# Patient Record
Sex: Female | Born: 1955 | Race: Black or African American | Hispanic: No | Smoking: Former smoker
Health system: Southern US, Community
[De-identification: ages and names within clinical notes are randomized; demographics above are authoritative.]

## PROBLEM LIST (undated history)

## (undated) DIAGNOSIS — M1990 Unspecified osteoarthritis, unspecified site: Secondary | ICD-10-CM

## (undated) DIAGNOSIS — C3490 Malignant neoplasm of unspecified part of unspecified bronchus or lung: Secondary | ICD-10-CM

## (undated) DIAGNOSIS — K219 Gastro-esophageal reflux disease without esophagitis: Secondary | ICD-10-CM

## (undated) DIAGNOSIS — J449 Chronic obstructive pulmonary disease, unspecified: Secondary | ICD-10-CM

## (undated) DIAGNOSIS — K529 Noninfective gastroenteritis and colitis, unspecified: Secondary | ICD-10-CM

## (undated) DIAGNOSIS — Z860100 Personal history of colon polyps, unspecified: Secondary | ICD-10-CM

## (undated) DIAGNOSIS — R011 Cardiac murmur, unspecified: Secondary | ICD-10-CM

## (undated) DIAGNOSIS — G4700 Insomnia, unspecified: Secondary | ICD-10-CM

## (undated) DIAGNOSIS — J189 Pneumonia, unspecified organism: Secondary | ICD-10-CM

## (undated) DIAGNOSIS — M549 Dorsalgia, unspecified: Secondary | ICD-10-CM

## (undated) DIAGNOSIS — R202 Paresthesia of skin: Secondary | ICD-10-CM

## (undated) DIAGNOSIS — F411 Generalized anxiety disorder: Secondary | ICD-10-CM

## (undated) DIAGNOSIS — G8929 Other chronic pain: Secondary | ICD-10-CM

## (undated) DIAGNOSIS — Z8709 Personal history of other diseases of the respiratory system: Secondary | ICD-10-CM

## (undated) HISTORY — DX: Generalized anxiety disorder: F41.1

## (undated) HISTORY — PX: WRIST FRACTURE SURGERY: SHX121

## (undated) HISTORY — PX: COLONOSCOPY: SHX174

## (undated) HISTORY — PX: LAPAROSCOPIC GASTRIC BANDING: SHX1100

---

## 1979-01-22 HISTORY — PX: TUBAL LIGATION: SHX77

## 1989-01-21 HISTORY — PX: ABDOMINAL HYSTERECTOMY: SHX81

## 1998-01-15 ENCOUNTER — Encounter: Admission: RE | Admit: 1998-01-15 | Discharge: 1998-01-15 | Payer: Self-pay | Admitting: Family Medicine

## 1998-06-30 ENCOUNTER — Encounter: Admission: RE | Admit: 1998-06-30 | Discharge: 1998-06-30 | Payer: Self-pay | Admitting: Sports Medicine

## 1998-09-02 ENCOUNTER — Encounter: Admission: RE | Admit: 1998-09-02 | Discharge: 1998-09-02 | Payer: Self-pay | Admitting: Family Medicine

## 1998-09-17 ENCOUNTER — Encounter: Admission: RE | Admit: 1998-09-17 | Discharge: 1998-09-17 | Payer: Self-pay | Admitting: Family Medicine

## 1998-10-20 ENCOUNTER — Encounter: Admission: RE | Admit: 1998-10-20 | Discharge: 1998-10-20 | Payer: Self-pay | Admitting: Family Medicine

## 1998-10-20 ENCOUNTER — Other Ambulatory Visit: Admission: RE | Admit: 1998-10-20 | Discharge: 1998-10-20 | Payer: Self-pay | Admitting: *Deleted

## 1998-11-03 ENCOUNTER — Encounter: Admission: RE | Admit: 1998-11-03 | Discharge: 1998-11-03 | Payer: Self-pay | Admitting: Sports Medicine

## 1998-11-30 ENCOUNTER — Other Ambulatory Visit: Admission: RE | Admit: 1998-11-30 | Discharge: 1998-11-30 | Payer: Self-pay | Admitting: Obstetrics and Gynecology

## 1998-11-30 ENCOUNTER — Encounter (INDEPENDENT_AMBULATORY_CARE_PROVIDER_SITE_OTHER): Payer: Self-pay | Admitting: *Deleted

## 1999-02-16 ENCOUNTER — Encounter: Admission: RE | Admit: 1999-02-16 | Discharge: 1999-02-16 | Payer: Self-pay | Admitting: Family Medicine

## 1999-04-27 ENCOUNTER — Encounter: Admission: RE | Admit: 1999-04-27 | Discharge: 1999-04-27 | Payer: Self-pay | Admitting: Family Medicine

## 1999-04-27 ENCOUNTER — Ambulatory Visit (HOSPITAL_COMMUNITY): Admission: RE | Admit: 1999-04-27 | Discharge: 1999-04-27 | Payer: Self-pay | Admitting: Family Medicine

## 1999-05-03 ENCOUNTER — Encounter: Admission: RE | Admit: 1999-05-03 | Discharge: 1999-05-03 | Payer: Self-pay | Admitting: Family Medicine

## 1999-05-26 ENCOUNTER — Encounter: Admission: RE | Admit: 1999-05-26 | Discharge: 1999-05-26 | Payer: Self-pay | Admitting: Family Medicine

## 1999-06-09 ENCOUNTER — Encounter (INDEPENDENT_AMBULATORY_CARE_PROVIDER_SITE_OTHER): Payer: Self-pay

## 1999-06-09 ENCOUNTER — Other Ambulatory Visit: Admission: RE | Admit: 1999-06-09 | Discharge: 1999-06-09 | Payer: Self-pay | Admitting: Obstetrics and Gynecology

## 1999-06-11 ENCOUNTER — Encounter: Admission: RE | Admit: 1999-06-11 | Discharge: 1999-06-11 | Payer: Self-pay | Admitting: Family Medicine

## 1999-08-04 ENCOUNTER — Encounter: Admission: RE | Admit: 1999-08-04 | Discharge: 1999-08-04 | Payer: Self-pay | Admitting: Obstetrics and Gynecology

## 1999-08-04 ENCOUNTER — Encounter: Payer: Self-pay | Admitting: Obstetrics and Gynecology

## 1999-08-24 ENCOUNTER — Encounter: Payer: Self-pay | Admitting: Obstetrics and Gynecology

## 1999-08-26 ENCOUNTER — Encounter (INDEPENDENT_AMBULATORY_CARE_PROVIDER_SITE_OTHER): Payer: Self-pay

## 1999-08-27 ENCOUNTER — Inpatient Hospital Stay (HOSPITAL_COMMUNITY): Admission: RE | Admit: 1999-08-27 | Discharge: 1999-08-28 | Payer: Self-pay | Admitting: Obstetrics and Gynecology

## 1999-11-08 ENCOUNTER — Encounter: Payer: Self-pay | Admitting: *Deleted

## 1999-11-08 ENCOUNTER — Encounter: Admission: RE | Admit: 1999-11-08 | Discharge: 1999-11-08 | Payer: Self-pay | Admitting: Sports Medicine

## 1999-11-08 ENCOUNTER — Encounter: Admission: RE | Admit: 1999-11-08 | Discharge: 1999-11-08 | Payer: Self-pay | Admitting: *Deleted

## 2000-03-24 ENCOUNTER — Encounter: Admission: RE | Admit: 2000-03-24 | Discharge: 2000-03-24 | Payer: Self-pay | Admitting: Family Medicine

## 2000-03-28 ENCOUNTER — Encounter: Admission: RE | Admit: 2000-03-28 | Discharge: 2000-03-28 | Payer: Self-pay | Admitting: Family Medicine

## 2000-07-19 ENCOUNTER — Ambulatory Visit (HOSPITAL_COMMUNITY): Admission: RE | Admit: 2000-07-19 | Discharge: 2000-07-19 | Payer: Self-pay | Admitting: *Deleted

## 2000-07-19 ENCOUNTER — Encounter: Admission: RE | Admit: 2000-07-19 | Discharge: 2000-07-19 | Payer: Self-pay | Admitting: Sports Medicine

## 2000-07-19 ENCOUNTER — Encounter: Payer: Self-pay | Admitting: Sports Medicine

## 2000-08-08 ENCOUNTER — Encounter: Admission: RE | Admit: 2000-08-08 | Discharge: 2000-08-08 | Payer: Self-pay | Admitting: Obstetrics and Gynecology

## 2000-08-08 ENCOUNTER — Encounter: Payer: Self-pay | Admitting: Obstetrics and Gynecology

## 2000-11-15 ENCOUNTER — Encounter: Admission: RE | Admit: 2000-11-15 | Discharge: 2000-11-15 | Payer: Self-pay | Admitting: Family Medicine

## 2000-11-24 ENCOUNTER — Ambulatory Visit (HOSPITAL_BASED_OUTPATIENT_CLINIC_OR_DEPARTMENT_OTHER): Admission: RE | Admit: 2000-11-24 | Discharge: 2000-11-24 | Payer: Self-pay | Admitting: *Deleted

## 2000-12-18 ENCOUNTER — Encounter: Admission: RE | Admit: 2000-12-18 | Discharge: 2000-12-18 | Payer: Self-pay | Admitting: Family Medicine

## 2001-01-03 ENCOUNTER — Encounter: Admission: RE | Admit: 2001-01-03 | Discharge: 2001-01-03 | Payer: Self-pay | Admitting: Family Medicine

## 2001-01-08 ENCOUNTER — Encounter: Admission: RE | Admit: 2001-01-08 | Discharge: 2001-01-08 | Payer: Self-pay | Admitting: Family Medicine

## 2001-03-05 ENCOUNTER — Encounter: Admission: RE | Admit: 2001-03-05 | Discharge: 2001-03-05 | Payer: Self-pay | Admitting: Family Medicine

## 2001-05-30 ENCOUNTER — Encounter: Admission: RE | Admit: 2001-05-30 | Discharge: 2001-05-30 | Payer: Self-pay | Admitting: Cardiovascular Disease

## 2001-05-30 ENCOUNTER — Encounter: Payer: Self-pay | Admitting: Cardiovascular Disease

## 2001-08-10 ENCOUNTER — Encounter: Payer: Self-pay | Admitting: Obstetrics and Gynecology

## 2001-08-10 ENCOUNTER — Encounter: Admission: RE | Admit: 2001-08-10 | Discharge: 2001-08-10 | Payer: Self-pay | Admitting: Obstetrics and Gynecology

## 2001-11-16 ENCOUNTER — Encounter: Admission: RE | Admit: 2001-11-16 | Discharge: 2001-11-16 | Payer: Self-pay | Admitting: Family Medicine

## 2001-11-28 ENCOUNTER — Encounter: Admission: RE | Admit: 2001-11-28 | Discharge: 2001-11-28 | Payer: Self-pay | Admitting: Family Medicine

## 2001-12-12 ENCOUNTER — Encounter: Admission: RE | Admit: 2001-12-12 | Discharge: 2002-01-02 | Payer: Self-pay | Admitting: Occupational Medicine

## 2002-02-05 ENCOUNTER — Encounter: Admission: RE | Admit: 2002-02-05 | Discharge: 2002-02-15 | Payer: Self-pay | Admitting: Orthopedic Surgery

## 2002-03-12 ENCOUNTER — Encounter: Admission: RE | Admit: 2002-03-12 | Discharge: 2002-03-12 | Payer: Self-pay | Admitting: Family Medicine

## 2002-04-17 ENCOUNTER — Other Ambulatory Visit: Admission: RE | Admit: 2002-04-17 | Discharge: 2002-04-17 | Payer: Self-pay | Admitting: Obstetrics and Gynecology

## 2002-05-17 ENCOUNTER — Encounter: Payer: Self-pay | Admitting: Orthopedic Surgery

## 2002-05-17 ENCOUNTER — Ambulatory Visit (HOSPITAL_COMMUNITY): Admission: RE | Admit: 2002-05-17 | Discharge: 2002-05-17 | Payer: Self-pay | Admitting: Orthopedic Surgery

## 2002-08-14 ENCOUNTER — Encounter: Admission: RE | Admit: 2002-08-14 | Discharge: 2002-08-14 | Payer: Self-pay | Admitting: Obstetrics and Gynecology

## 2002-08-14 ENCOUNTER — Encounter: Payer: Self-pay | Admitting: Obstetrics and Gynecology

## 2002-08-23 ENCOUNTER — Encounter: Admission: RE | Admit: 2002-08-23 | Discharge: 2002-08-23 | Payer: Self-pay | Admitting: Family Medicine

## 2003-05-09 ENCOUNTER — Encounter: Admission: RE | Admit: 2003-05-09 | Discharge: 2003-05-09 | Payer: Self-pay | Admitting: Sports Medicine

## 2003-05-29 ENCOUNTER — Encounter: Admission: RE | Admit: 2003-05-29 | Discharge: 2003-05-29 | Payer: Self-pay | Admitting: Family Medicine

## 2003-06-09 ENCOUNTER — Encounter: Admission: RE | Admit: 2003-06-09 | Discharge: 2003-06-09 | Payer: Self-pay | Admitting: Sports Medicine

## 2003-07-29 ENCOUNTER — Encounter: Admission: RE | Admit: 2003-07-29 | Discharge: 2003-07-29 | Payer: Self-pay | Admitting: Surgery

## 2003-08-05 ENCOUNTER — Encounter: Admission: RE | Admit: 2003-08-05 | Discharge: 2003-11-03 | Payer: Self-pay | Admitting: Surgery

## 2003-08-06 ENCOUNTER — Encounter: Admission: RE | Admit: 2003-08-06 | Discharge: 2003-08-06 | Payer: Self-pay | Admitting: Surgery

## 2003-08-25 ENCOUNTER — Encounter: Admission: RE | Admit: 2003-08-25 | Discharge: 2003-08-25 | Payer: Self-pay | Admitting: Family Medicine

## 2003-09-12 ENCOUNTER — Encounter: Payer: Self-pay | Admitting: Internal Medicine

## 2003-09-12 ENCOUNTER — Ambulatory Visit (HOSPITAL_COMMUNITY): Admission: RE | Admit: 2003-09-12 | Discharge: 2003-09-12 | Payer: Self-pay | Admitting: Surgery

## 2003-09-21 ENCOUNTER — Encounter (INDEPENDENT_AMBULATORY_CARE_PROVIDER_SITE_OTHER): Payer: Self-pay | Admitting: *Deleted

## 2003-10-02 ENCOUNTER — Encounter: Admission: RE | Admit: 2003-10-02 | Discharge: 2003-10-02 | Payer: Self-pay | Admitting: Sports Medicine

## 2003-10-15 ENCOUNTER — Encounter: Admission: RE | Admit: 2003-10-15 | Discharge: 2003-10-15 | Payer: Self-pay | Admitting: Surgery

## 2003-11-25 ENCOUNTER — Encounter: Admission: RE | Admit: 2003-11-25 | Discharge: 2004-02-09 | Payer: Self-pay | Admitting: Surgery

## 2003-12-09 ENCOUNTER — Observation Stay (HOSPITAL_COMMUNITY): Admission: RE | Admit: 2003-12-09 | Discharge: 2003-12-10 | Payer: Self-pay | Admitting: *Deleted

## 2003-12-24 ENCOUNTER — Ambulatory Visit (HOSPITAL_COMMUNITY): Admission: RE | Admit: 2003-12-24 | Discharge: 2003-12-24 | Payer: Self-pay | Admitting: Surgery

## 2003-12-24 ENCOUNTER — Encounter: Admission: RE | Admit: 2003-12-24 | Discharge: 2003-12-24 | Payer: Self-pay | Admitting: Surgery

## 2003-12-25 ENCOUNTER — Encounter: Admission: RE | Admit: 2003-12-25 | Discharge: 2003-12-25 | Payer: Self-pay | Admitting: Family Medicine

## 2004-02-24 ENCOUNTER — Encounter: Admission: RE | Admit: 2004-02-24 | Discharge: 2004-05-24 | Payer: Self-pay | Admitting: Surgery

## 2004-03-08 ENCOUNTER — Encounter: Admission: RE | Admit: 2004-03-08 | Discharge: 2004-03-08 | Payer: Self-pay | Admitting: General Surgery

## 2004-07-06 ENCOUNTER — Encounter: Admission: RE | Admit: 2004-07-06 | Discharge: 2004-07-06 | Payer: Self-pay | Admitting: Sports Medicine

## 2004-12-06 ENCOUNTER — Ambulatory Visit: Payer: Self-pay | Admitting: Sports Medicine

## 2005-08-22 ENCOUNTER — Ambulatory Visit: Payer: Self-pay | Admitting: Sports Medicine

## 2005-08-22 ENCOUNTER — Encounter: Admission: RE | Admit: 2005-08-22 | Discharge: 2005-08-22 | Payer: Self-pay | Admitting: Sports Medicine

## 2005-08-24 ENCOUNTER — Ambulatory Visit: Payer: Self-pay | Admitting: Family Medicine

## 2005-08-25 ENCOUNTER — Encounter: Admission: RE | Admit: 2005-08-25 | Discharge: 2005-08-25 | Payer: Self-pay | Admitting: Sports Medicine

## 2005-09-13 ENCOUNTER — Ambulatory Visit: Payer: Self-pay | Admitting: Family Medicine

## 2006-03-16 ENCOUNTER — Encounter: Admission: RE | Admit: 2006-03-16 | Discharge: 2006-03-16 | Payer: Self-pay | Admitting: *Deleted

## 2006-04-20 ENCOUNTER — Ambulatory Visit: Payer: Self-pay | Admitting: Family Medicine

## 2006-05-04 ENCOUNTER — Ambulatory Visit: Payer: Self-pay | Admitting: Sports Medicine

## 2006-05-08 ENCOUNTER — Ambulatory Visit: Payer: Self-pay | Admitting: Family Medicine

## 2006-07-20 DIAGNOSIS — F17200 Nicotine dependence, unspecified, uncomplicated: Secondary | ICD-10-CM

## 2006-07-20 DIAGNOSIS — F411 Generalized anxiety disorder: Secondary | ICD-10-CM

## 2006-07-20 DIAGNOSIS — G4700 Insomnia, unspecified: Secondary | ICD-10-CM

## 2006-07-20 DIAGNOSIS — E669 Obesity, unspecified: Secondary | ICD-10-CM | POA: Insufficient documentation

## 2006-07-20 DIAGNOSIS — M545 Low back pain: Secondary | ICD-10-CM

## 2006-07-20 HISTORY — DX: Generalized anxiety disorder: F41.1

## 2006-07-21 ENCOUNTER — Encounter (INDEPENDENT_AMBULATORY_CARE_PROVIDER_SITE_OTHER): Payer: Self-pay | Admitting: *Deleted

## 2006-07-21 ENCOUNTER — Ambulatory Visit: Payer: Self-pay | Admitting: Family Medicine

## 2006-07-28 ENCOUNTER — Telehealth: Payer: Self-pay | Admitting: *Deleted

## 2006-08-03 ENCOUNTER — Encounter: Payer: Self-pay | Admitting: *Deleted

## 2006-08-04 ENCOUNTER — Ambulatory Visit: Payer: Self-pay | Admitting: Family Medicine

## 2006-08-04 DIAGNOSIS — R1031 Right lower quadrant pain: Secondary | ICD-10-CM

## 2006-08-04 DIAGNOSIS — N951 Menopausal and female climacteric states: Secondary | ICD-10-CM | POA: Insufficient documentation

## 2006-08-04 LAB — CONVERTED CEMR LAB
Bilirubin Urine: NEGATIVE
Blood in Urine, dipstick: NEGATIVE
Glucose, Urine, Semiquant: NEGATIVE
Hemoglobin: 11.6 g/dL
Protein, U semiquant: 30
pH: 5.5

## 2006-08-11 ENCOUNTER — Telehealth: Payer: Self-pay | Admitting: *Deleted

## 2006-08-12 ENCOUNTER — Encounter: Payer: Self-pay | Admitting: Family Medicine

## 2006-08-12 LAB — CONVERTED CEMR LAB: OCCULT 1: NEGATIVE

## 2006-08-13 ENCOUNTER — Encounter: Payer: Self-pay | Admitting: Family Medicine

## 2006-08-13 LAB — CONVERTED CEMR LAB: OCCULT 3: NEGATIVE

## 2006-08-14 ENCOUNTER — Encounter: Payer: Self-pay | Admitting: Family Medicine

## 2006-08-14 LAB — CONVERTED CEMR LAB: OCCULT 2: NEGATIVE

## 2006-08-17 ENCOUNTER — Encounter: Payer: Self-pay | Admitting: Family Medicine

## 2006-08-28 ENCOUNTER — Encounter: Admission: RE | Admit: 2006-08-28 | Discharge: 2006-08-28 | Payer: Self-pay | Admitting: Sports Medicine

## 2006-08-28 ENCOUNTER — Encounter: Payer: Self-pay | Admitting: Family Medicine

## 2006-09-20 ENCOUNTER — Telehealth: Payer: Self-pay | Admitting: *Deleted

## 2006-09-22 ENCOUNTER — Telehealth: Payer: Self-pay | Admitting: *Deleted

## 2006-10-12 ENCOUNTER — Encounter: Payer: Self-pay | Admitting: *Deleted

## 2006-10-31 ENCOUNTER — Telehealth: Payer: Self-pay | Admitting: *Deleted

## 2006-11-01 ENCOUNTER — Ambulatory Visit: Payer: Self-pay | Admitting: Family Medicine

## 2006-11-01 DIAGNOSIS — R350 Frequency of micturition: Secondary | ICD-10-CM

## 2006-11-01 LAB — CONVERTED CEMR LAB
Glucose, Urine, Semiquant: NEGATIVE
Nitrite: NEGATIVE
Protein, U semiquant: 100
Urobilinogen, UA: 1

## 2006-11-22 ENCOUNTER — Ambulatory Visit: Payer: Self-pay | Admitting: Family Medicine

## 2006-11-22 DIAGNOSIS — K5900 Constipation, unspecified: Secondary | ICD-10-CM | POA: Insufficient documentation

## 2006-11-27 ENCOUNTER — Telehealth (INDEPENDENT_AMBULATORY_CARE_PROVIDER_SITE_OTHER): Payer: Self-pay | Admitting: Family Medicine

## 2006-11-30 ENCOUNTER — Ambulatory Visit: Payer: Self-pay | Admitting: Internal Medicine

## 2006-11-30 ENCOUNTER — Encounter: Payer: Self-pay | Admitting: Internal Medicine

## 2006-12-19 ENCOUNTER — Ambulatory Visit: Payer: Self-pay | Admitting: Gastroenterology

## 2007-01-01 ENCOUNTER — Encounter: Payer: Self-pay | Admitting: Internal Medicine

## 2007-01-01 ENCOUNTER — Ambulatory Visit: Payer: Self-pay | Admitting: Gastroenterology

## 2007-01-03 ENCOUNTER — Telehealth: Payer: Self-pay | Admitting: *Deleted

## 2007-01-18 ENCOUNTER — Encounter: Payer: Self-pay | Admitting: Internal Medicine

## 2007-05-08 ENCOUNTER — Ambulatory Visit: Payer: Self-pay | Admitting: Internal Medicine

## 2007-05-08 DIAGNOSIS — E782 Mixed hyperlipidemia: Secondary | ICD-10-CM | POA: Insufficient documentation

## 2007-05-08 DIAGNOSIS — G43009 Migraine without aura, not intractable, without status migrainosus: Secondary | ICD-10-CM | POA: Insufficient documentation

## 2007-05-08 LAB — CONVERTED CEMR LAB
ALT: 18 units/L (ref 0–35)
AST: 16 units/L (ref 0–37)
Alkaline Phosphatase: 87 units/L (ref 39–117)
BUN: 8 mg/dL (ref 6–23)
Basophils Relative: 0.3 % (ref 0.0–1.0)
CO2: 30 meq/L (ref 19–32)
Calcium: 10.1 mg/dL (ref 8.4–10.5)
Chloride: 106 meq/L (ref 96–112)
Creatinine, Ser: 0.8 mg/dL (ref 0.4–1.2)
GFR calc Af Amer: 97 mL/min
Monocytes Relative: 4.6 % (ref 3.0–11.0)
Platelets: 349 10*3/uL (ref 150–400)
RDW: 15 % — ABNORMAL HIGH (ref 11.5–14.6)
TSH: 0.78 microintl units/mL (ref 0.35–5.50)
Total Bilirubin: 0.5 mg/dL (ref 0.3–1.2)
Total Protein: 6.1 g/dL (ref 6.0–8.3)
WBC: 7 10*3/uL (ref 4.5–10.5)

## 2007-05-09 ENCOUNTER — Encounter: Payer: Self-pay | Admitting: Internal Medicine

## 2007-05-10 ENCOUNTER — Telehealth: Payer: Self-pay | Admitting: Internal Medicine

## 2007-06-06 ENCOUNTER — Telehealth: Payer: Self-pay | Admitting: Internal Medicine

## 2007-07-09 ENCOUNTER — Telehealth (INDEPENDENT_AMBULATORY_CARE_PROVIDER_SITE_OTHER): Payer: Self-pay | Admitting: Family Medicine

## 2007-08-27 ENCOUNTER — Telehealth: Payer: Self-pay | Admitting: *Deleted

## 2007-08-30 ENCOUNTER — Encounter: Admission: RE | Admit: 2007-08-30 | Discharge: 2007-08-30 | Payer: Self-pay | Admitting: Internal Medicine

## 2007-09-03 ENCOUNTER — Telehealth: Payer: Self-pay | Admitting: Internal Medicine

## 2007-09-05 ENCOUNTER — Ambulatory Visit: Payer: Self-pay | Admitting: Internal Medicine

## 2007-09-05 DIAGNOSIS — N63 Unspecified lump in unspecified breast: Secondary | ICD-10-CM

## 2007-09-10 ENCOUNTER — Encounter: Admission: RE | Admit: 2007-09-10 | Discharge: 2007-09-10 | Payer: Self-pay | Admitting: Internal Medicine

## 2007-09-24 ENCOUNTER — Telehealth: Payer: Self-pay | Admitting: Internal Medicine

## 2007-09-24 ENCOUNTER — Encounter: Admission: RE | Admit: 2007-09-24 | Discharge: 2007-09-24 | Payer: Self-pay | Admitting: Internal Medicine

## 2007-09-25 ENCOUNTER — Encounter: Payer: Self-pay | Admitting: Internal Medicine

## 2007-10-08 ENCOUNTER — Encounter: Admission: RE | Admit: 2007-10-08 | Discharge: 2007-10-08 | Payer: Self-pay | Admitting: Internal Medicine

## 2008-01-11 ENCOUNTER — Ambulatory Visit: Payer: Self-pay | Admitting: Internal Medicine

## 2008-01-11 DIAGNOSIS — M65849 Other synovitis and tenosynovitis, unspecified hand: Secondary | ICD-10-CM

## 2008-01-11 DIAGNOSIS — R1084 Generalized abdominal pain: Secondary | ICD-10-CM

## 2008-01-11 DIAGNOSIS — M65839 Other synovitis and tenosynovitis, unspecified forearm: Secondary | ICD-10-CM

## 2008-08-27 DIAGNOSIS — R042 Hemoptysis: Secondary | ICD-10-CM | POA: Insufficient documentation

## 2008-09-19 ENCOUNTER — Ambulatory Visit: Payer: Self-pay | Admitting: Family Medicine

## 2008-09-19 ENCOUNTER — Ambulatory Visit: Payer: Self-pay | Admitting: Internal Medicine

## 2008-09-19 LAB — CONVERTED CEMR LAB: Urobilinogen, UA: 4

## 2008-09-22 ENCOUNTER — Telehealth (INDEPENDENT_AMBULATORY_CARE_PROVIDER_SITE_OTHER): Payer: Self-pay

## 2008-09-25 ENCOUNTER — Telehealth: Payer: Self-pay | Admitting: Internal Medicine

## 2008-09-25 LAB — CONVERTED CEMR LAB
Alkaline Phosphatase: 74 units/L (ref 39–117)
Basophils Absolute: 0 10*3/uL (ref 0.0–0.1)
Bilirubin, Direct: 0 mg/dL (ref 0.0–0.3)
CO2: 33 meq/L — ABNORMAL HIGH (ref 19–32)
Calcium: 9.7 mg/dL (ref 8.4–10.5)
Cholesterol: 220 mg/dL — ABNORMAL HIGH (ref 0–200)
Creatinine, Ser: 0.7 mg/dL (ref 0.4–1.2)
Eosinophils Absolute: 0.2 10*3/uL (ref 0.0–0.7)
Glucose, Bld: 100 mg/dL — ABNORMAL HIGH (ref 70–99)
Lymphocytes Relative: 50.4 % — ABNORMAL HIGH (ref 12.0–46.0)
MCHC: 34.1 g/dL (ref 30.0–36.0)
Neutrophils Relative %: 38.7 % — ABNORMAL LOW (ref 43.0–77.0)
Platelets: 331 10*3/uL (ref 150.0–400.0)
RBC: 3.73 M/uL — ABNORMAL LOW (ref 3.87–5.11)
RDW: 12.8 % (ref 11.5–14.6)
Total Bilirubin: 0.6 mg/dL (ref 0.3–1.2)
Total CHOL/HDL Ratio: 3
Triglycerides: 89 mg/dL (ref 0.0–149.0)

## 2008-10-01 ENCOUNTER — Ambulatory Visit: Payer: Self-pay | Admitting: Pulmonary Disease

## 2008-12-25 ENCOUNTER — Encounter: Admission: RE | Admit: 2008-12-25 | Discharge: 2008-12-25 | Payer: Self-pay | Admitting: Internal Medicine

## 2009-01-05 ENCOUNTER — Telehealth: Payer: Self-pay | Admitting: Internal Medicine

## 2009-02-12 ENCOUNTER — Ambulatory Visit: Payer: Self-pay | Admitting: Internal Medicine

## 2009-02-19 ENCOUNTER — Ambulatory Visit (HOSPITAL_BASED_OUTPATIENT_CLINIC_OR_DEPARTMENT_OTHER): Admission: RE | Admit: 2009-02-19 | Discharge: 2009-02-19 | Payer: Self-pay | Admitting: Orthopedic Surgery

## 2009-02-26 ENCOUNTER — Telehealth: Payer: Self-pay | Admitting: Internal Medicine

## 2009-03-16 ENCOUNTER — Telehealth: Payer: Self-pay | Admitting: Internal Medicine

## 2009-05-12 ENCOUNTER — Inpatient Hospital Stay (HOSPITAL_COMMUNITY): Admission: EM | Admit: 2009-05-12 | Discharge: 2009-05-13 | Payer: Self-pay | Admitting: Emergency Medicine

## 2009-06-18 ENCOUNTER — Encounter: Payer: Self-pay | Admitting: Internal Medicine

## 2009-08-10 ENCOUNTER — Ambulatory Visit: Payer: Self-pay | Admitting: Internal Medicine

## 2009-08-10 ENCOUNTER — Telehealth: Payer: Self-pay | Admitting: Internal Medicine

## 2009-08-10 DIAGNOSIS — K5909 Other constipation: Secondary | ICD-10-CM

## 2009-08-10 DIAGNOSIS — K5730 Diverticulosis of large intestine without perforation or abscess without bleeding: Secondary | ICD-10-CM | POA: Insufficient documentation

## 2009-08-10 LAB — CONVERTED CEMR LAB
AST: 16 units/L (ref 0–37)
Albumin: 3.6 g/dL (ref 3.5–5.2)
Basophils Absolute: 0.1 10*3/uL (ref 0.0–0.1)
Basophils Relative: 0.9 % (ref 0.0–3.0)
CO2: 34 meq/L — ABNORMAL HIGH (ref 19–32)
Glucose, Bld: 102 mg/dL — ABNORMAL HIGH (ref 70–99)
HCT: 35.3 % — ABNORMAL LOW (ref 36.0–46.0)
Hemoglobin: 11.5 g/dL — ABNORMAL LOW (ref 12.0–15.0)
Lymphs Abs: 2.5 10*3/uL (ref 0.7–4.0)
MCHC: 32.7 g/dL (ref 30.0–36.0)
Monocytes Relative: 5.4 % (ref 3.0–12.0)
Neutro Abs: 2.9 10*3/uL (ref 1.4–7.7)
Potassium: 4.2 meq/L (ref 3.5–5.1)
RBC: 3.72 M/uL — ABNORMAL LOW (ref 3.87–5.11)
RDW: 14.1 % (ref 11.5–14.6)
Sodium: 141 meq/L (ref 135–145)
TSH: 0.68 microintl units/mL (ref 0.35–5.50)
Total Protein: 6.2 g/dL (ref 6.0–8.3)

## 2009-08-17 ENCOUNTER — Encounter: Admission: RE | Admit: 2009-08-17 | Discharge: 2009-08-17 | Payer: Self-pay | Admitting: Internal Medicine

## 2009-08-24 ENCOUNTER — Telehealth: Payer: Self-pay

## 2009-09-15 ENCOUNTER — Ambulatory Visit: Payer: Self-pay | Admitting: Internal Medicine

## 2009-09-16 ENCOUNTER — Encounter (INDEPENDENT_AMBULATORY_CARE_PROVIDER_SITE_OTHER): Payer: Self-pay | Admitting: *Deleted

## 2009-10-27 ENCOUNTER — Telehealth: Payer: Self-pay | Admitting: Internal Medicine

## 2009-11-02 ENCOUNTER — Telehealth: Payer: Self-pay | Admitting: Internal Medicine

## 2010-01-14 ENCOUNTER — Ambulatory Visit: Payer: Self-pay | Admitting: Internal Medicine

## 2010-01-14 DIAGNOSIS — J029 Acute pharyngitis, unspecified: Secondary | ICD-10-CM | POA: Insufficient documentation

## 2010-02-11 ENCOUNTER — Encounter (INDEPENDENT_AMBULATORY_CARE_PROVIDER_SITE_OTHER): Payer: Self-pay | Admitting: *Deleted

## 2010-03-30 ENCOUNTER — Ambulatory Visit: Payer: Self-pay | Admitting: Gastroenterology

## 2010-03-31 ENCOUNTER — Ambulatory Visit: Payer: Self-pay | Admitting: Internal Medicine

## 2010-04-30 ENCOUNTER — Ambulatory Visit: Payer: Self-pay | Admitting: Internal Medicine

## 2010-04-30 DIAGNOSIS — M25519 Pain in unspecified shoulder: Secondary | ICD-10-CM | POA: Insufficient documentation

## 2010-04-30 DIAGNOSIS — M79609 Pain in unspecified limb: Secondary | ICD-10-CM | POA: Insufficient documentation

## 2010-06-13 ENCOUNTER — Encounter: Payer: Self-pay | Admitting: Internal Medicine

## 2010-06-13 ENCOUNTER — Encounter: Payer: Self-pay | Admitting: Surgery

## 2010-06-22 NOTE — Letter (Signed)
Summary: New Patient letter  [facility]  [address] [place], [place] [address]   Phone: [phone]  Fax: [phone]       09/16/2009 MRN: [id number]  Lauren Campos [address] [place], [place]  [address]  Dear Lauren Campos,  Welcome to the Gastroenterology Division at [facility].    You are scheduled to see Dr. Jarold Motto on 10-13-09 at 2:00p.m. on the 3rd floor at [facility], [address]  We ask that you try to arrive at our office 15 minutes prior to your appointment time to allow for check-in.  We would like you to complete the enclosed self-administered evaluation form prior to your visit and bring it with you on the day of your appointment.  We will review it with you.  Also, please bring a complete list of all your medications or, if you prefer, bring the medication bottles and we will list them.  Please bring your insurance card so that we may make a copy of it.  If your insurance requires a referral to see a specialist, please bring your referral form from your primary care physician.  Co-payments are due at the time of your visit and may be paid by cash, check or credit card.     Your office visit will consist of a consult with your physician (includes a physical exam), any laboratory testing he/she may order, scheduling of any necessary diagnostic testing (e.g. x-ray, ultrasound, CT-scan), and scheduling of a procedure (e.g. Endoscopy, Colonoscopy) if required.  Please allow enough time on your schedule to allow for any/all of these possibilities.    If you cannot keep your appointment, please call [phone] to cancel or reschedule prior to your appointment date.  This allows [place] the opportunity to schedule an appointment for another patient in need of care.  If you do not cancel or reschedule by 5 p.m. the business day prior to your appointment date, you will be charged a $50.00 late cancellation/no-show fee.    Thank you for  choosing [facility] for your medical needs.  We appreciate the opportunity to care for you.  Please visit [place] at our website  to learn more about our practice.                     Sincerely,                                                             The Gastroenterology Division

## 2010-06-22 NOTE — Letter (Signed)
Summary: Out of Work  Adult nurse at [employer]  [address]   [place], [place] [address]   Phone: [phone]  Fax: [phone]    April 30, 2010   Employee:  SHANEKA EFAW    To Whom It May Concern:   For Medical reasons, please excuse the above named employee from work for the following dates:  Start:   04-30-10  End:   05-01-10  If you need additional information, please feel free to contact our office.         Sincerely,    Gordy Savers  MD

## 2010-06-22 NOTE — Assessment & Plan Note (Signed)
Summary: AB PAIN//CCM   Vital Signs:  Patient profile:   55 year old female Weight:      184 pounds Temp:     98.4 degrees F oral BP sitting:   120 / 80  (right arm) Cuff size:   regular  Vitals Entered By: Duard Brady LPN (August 10, 2009 1:45 PM) CC: c/o 3wks on/off abd pain , c/o sharp pain in vaginal area , hysto 5 yrs ago - has not seen gyn Is Patient Diabetic? No   Primary Care Provider:  Gordy Savers  MD  CC:  c/o 3wks on/off abd pain , c/o sharp pain in vaginal area , and hysto 5 yrs ago - has not seen gyn.  History of Present Illness: 55 year old patient who is seen today with a chief complaint of epigastric and left upper quadrant abdominal pain.  This has been present for approximately 3 weeks.  She also describes some sharp pelvic pain for the past 3 to 4 days.  She has had a remote hysterectomy.  She states she has had actually no bowel movement in 3 months.  He denies any anorexia, nausea or vomiting.  She has used a number of laxatives as well as enemas.  She did have a colonoscopy in August of 2008 that revealed mild diverticular disease only medical regimen includes Vicodin and amitriptyline.  She is  status post lap band surgery approximately 5 years ago.  She states that she was told 20 years ago, that she had "inflammatory bowel disease".  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  Was also receiving ideas of reference. (see office note) hysterectomy was performed because of cervical dysplasia and fibroids but no overt cancer.  Per operative note of 08/1999 [facility]. long history of history of  anxiety disorder history of borderline hypercholesterolemia chronic constipation Diverticulosis, colon  Family History: Reviewed history  from 10/01/2008 and no changes required. 3 bros, 1 sis - hypothyroid,  mother  DM   asthma: brother heart disease: mother (died MI 22s), father,  brother (died MI 28s) cancer: father (prostate), mother (breast)   Review of Systems       The patient complains of abdominal pain.  The patient denies anorexia, fever, weight loss, weight gain, vision loss, decreased hearing, hoarseness, chest pain, syncope, dyspnea on exertion, peripheral edema, prolonged cough, headaches, hemoptysis, melena, hematochezia, severe indigestion/heartburn, hematuria, incontinence, genital sores, muscle weakness, suspicious skin lesions, transient blindness, difficulty walking, depression, unusual weight change, abnormal bleeding, enlarged lymph nodes, angioedema, and breast masses.    Physical Exam  General:  overweight-appearing.  no distress.  Normal blood pressure, afebrileoverweight-appearing.   Head:  Normocephalic and atraumatic without obvious abnormalities. No apparent alopecia or balding. Eyes:  No corneal or conjunctival inflammation noted. EOMI. Perrla. Funduscopic exam benign, without hemorrhages, exudates or papilledema. Vision grossly normal. Mouth:  Oral mucosa and oropharynx without lesions or exudates.  Teeth in good repair. Neck:  No deformities, masses, or tenderness noted. Lungs:  Normal respiratory effort, chest expands symmetrically. Lungs are clear to auscultation, no crackles or wheezes. Heart:  Normal rate and regular rhythm. S1 and S2 normal without gallop, murmur, click, rub or other extra sounds. Abdomen:  obese soft and nontender.  No apparent distention.  Bowel sounds were normal.  No guarding Rectal:  no stool in the rectal vault Msk:  No deformity or scoliosis noted of thoracic or lumbar spine.   Pulses:  R and L carotid,radial,femoral,dorsalis pedis and posterior tibial pulses are full and equal bilaterally Extremities:  No clubbing, cyanosis, edema, or deformity noted with normal  full range of motion of all joints.     Impression & Recommendations:  Problem # 1:  CONSTIPATION, CHRONIC (ICD-564.09)  Problem # 2:  ABDOMINAL PAIN, GENERALIZED (ICD-789.07)  Problem # 3:  OBESITY, NOS (ICD-278.00)  Complete Medication List: 1)  Amitriptyline Hcl 100 Mg Tabs (Amitriptyline hcl) .Marland Kitchen.. 1 once daily as needed 2)  Betamethasone Valerate 0.1 % Lotn (Betamethasone valerate) .... Apply 1 a small amount to affected area twice a day 3)  Vicodin 5-500 Mg Tabs (Hydrocodone-acetaminophen) .... One up to two times a day prn 4)  Klor-con 20 Meq Pack (Potassium chloride) .Marland Kitchen.. 1 once daily 5)  Tramadol Hcl 50 Mg Tabs (Tramadol hcl) .... One every 6 hours or as needed for pain 6)  Clonazepam 0.5 Mg Tabs (Clonazepam) .... One or two at bedtime as needed  Other Orders: Venipuncture (40981) TLB-BMP (Basic Metabolic Panel-BMET) (80048-METABOL) TLB-CBC Platelet - w/Differential (85025-CBCD) TLB-Hepatic/Liver Function Pnl (80076-HEPATIC) TLB-TSH (Thyroid Stimulating Hormone) (84443-TSH) T-Abdomen 2-view (19147WG)  Patient Instructions: 1)  Drink clear liquids only for the next 24 hours, then slowly add other liquids and food as you  tolerate them. 2)  It is important that you exercise regularly at least 20 minutes 5 times a week. If you develop chest pain, have severe difficulty breathing, or feel very tired , stop exercising immediately and seek medical attention. 3)  high fiber diet 4)  use MiraLax to 4 times daily until diarrhea 5)  Please schedule a follow-up appointment in 2 weeks. Prescriptions: CLONAZEPAM 0.5 MG TABS (CLONAZEPAM) one or two at bedtime as needed  #60 x 3   Entered and Authorized by:   Gordy Savers  MD   Signed by:   Gordy Savers  MD on 08/10/2009   Method used:   Print then Give to Patient   RxID:   [id number] TRAMADOL HCL 50 MG TABS (TRAMADOL HCL) one every 6 hours or as needed for pain  #100 x 3   Entered and Authorized by:   Gordy Savers  MD   Signed by:   Gordy Savers  MD on 08/10/2009   Method used:   Print then Give to Patient   RxID:   [id number]

## 2010-06-22 NOTE — Assessment & Plan Note (Signed)
Summary: sore throat/numbness in tongue/cjr   Vital Signs:  Patient profile:   55 year old female Weight:      184 pounds Temp:     98.1 degrees F oral  Vitals Entered By: Kathrynn Speed CMA (January 14, 2010 3:40 PM) CC: sore throat, x 2 weeks, numbness around tip of tongue, adb pain, ache, on both sides,Requesting refills, this is a different type of sore throat she is concerned maybe cancer, currently smoking one pack of cig a day,  src Is Patient Diabetic? No   Primary Care Provider:  Gordy Savers  MD  CC:  sore throat, x 2 weeks, numbness around tip of tongue, adb pain, ache, on both sides, Requesting refills, this is a different type of sore throat she is concerned maybe cancer, currently smoking one pack of cig a day, and src.  History of Present Illness: 55 year old patient who presents with a 10 day history of sore throat.  He denies much in the way of cough or URI symptoms.  She does smoke daily.  She is requesting the phentermine to assist with weight loss.  No productive cough, chest pain.  He does have a history mild dyslipidemia, and anxiety disorder  Preventive Screening-Counseling & Management  Alcohol-Tobacco     Smoking Status: current     Smoking Cessation Counseling: yes     Packs/Day: 1.0     Year Started: 1971  Current Medications (verified): 1)  Amitriptyline Hcl 100 Mg Tabs (Amitriptyline Hcl) .Marland Kitchen.. 1 Once Daily As Needed 2)  Clonazepam 0.5 Mg Tabs (Clonazepam) .... One or Two At Bedtime As Needed 3)  Vicodin 5-500 Mg Tabs (Hydrocodone-Acetaminophen) .Marland Kitchen.. 1 By Mouth Two Times A Day Prn  Allergies (verified): No Known Drug Allergies  Past History:  Past Medical History: Reviewed history from 08/10/2009 and no changes required. 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  Was also receiving ideas of reference. (see office note) hysterectomy was performed because of cervical dysplasia and fibroids but no  overt cancer.  Per operative note of 08/1999 [facility]. long history of history of  anxiety disorder history of borderline hypercholesterolemia chronic constipation Diverticulosis, colon  Social History: Packs/Day:  1.0  Physical Exam  General:  overweight-appearing.  normal blood pressure Head:  Normocephalic and atraumatic without obvious abnormalities. No apparent alopecia or balding. Eyes:  No corneal or conjunctival inflammation noted. EOMI. Perrla. Funduscopic exam benign, without hemorrhages, exudates or papilledema. Vision grossly normal. Ears:  External ear exam shows no significant lesions or deformities.  Otoscopic examination reveals clear canals, tympanic membranes are intact bilaterally without bulging, retraction, inflammation or discharge. Hearing is grossly normal bilaterally. Mouth:  Oral mucosa and oropharynx without lesions or exudates. upper dentures in place Neck:  No deformities, masses, or tenderness noted. Lungs:  Normal respiratory effort, chest expands symmetrically. Lungs are clear to auscultation, no crackles or wheezes. Heart:  Normal rate and regular rhythm. S1 and S2 normal without gallop, murmur, click, rub or other extra sounds. Abdomen:  Bowel sounds positive,abdomen soft and non-tender without masses, organomegaly or hernias noted.   Impression & Recommendations:  Problem # 1:  TOBACCO DEPENDENCE (ICD-305.1) smoking cessation discussed and encouraged  Problem # 2:  OBESITY, NOS (ICD-278.00) will treat with 30 days of phentermine.  Chronic use discussed and discouraged  Problem # 3:  SORE THROAT (ICD-462) unremarkable exam; will treat with short-term Celebrex, and she will notify us if this is not improved  in 5 to 7 days  Complete Medication List: 1)  Amitriptyline Hcl 100 Mg Tabs (Amitriptyline hcl) .Marland Kitchen.. 1 once daily as needed 2)  Clonazepam 0.5 Mg Tabs (Clonazepam) .... One or two at bedtime as needed 3)  Vicodin 5-500 Mg Tabs  (Hydrocodone-acetaminophen) .Marland Kitchen.. 1 by mouth two times a day prn 4)  Phentermine Hcl 37.5 Mg Caps (Phentermine hcl) .... One daily  Patient Instructions: 1)  Tobacco is very bad for your health and your loved ones! You Should stop smoking!. 2)  call if sore throat persists Prescriptions: PHENTERMINE HCL 37.5 MG CAPS (PHENTERMINE HCL) one daily  #30 x 0   Entered and Authorized by:   Gordy Savers  MD   Signed by:   Gordy Savers  MD on 01/14/2010   Method used:   Print then Give to Patient   RxID:   [id number] CLONAZEPAM 0.5 MG TABS (CLONAZEPAM) one or two at bedtime as needed  #60 x 3   Entered and Authorized by:   Gordy Savers  MD   Signed by:   Gordy Savers  MD on 01/14/2010   Method used:   Print then Give to Patient   RxID:   [id number] AMITRIPTYLINE HCL 100 MG TABS (AMITRIPTYLINE HCL) 1 once daily as needed  #90 x 4   Entered and Authorized by:   Gordy Savers  MD   Signed by:   Gordy Savers  MD on 01/14/2010   Method used:   Print then Give to Patient   RxID:   [id number]   Laboratory Results  Date/Time Received: January 14, 2010  Date/Time Reported: January 14, 2010   Other Tests  Rapid Strep: negative Comments: Kathrynn Speed CMA  January 14, 2010 3:57 PM

## 2010-06-22 NOTE — Assessment & Plan Note (Signed)
Summary: OOW note/dm   Vital Signs:  Patient profile:   55 year old female Weight:      186 pounds Temp:     98.6 degrees F oral BP sitting:   110 / 70  (left arm) Cuff size:   regular  Vitals Entered By: Duard Brady LPN (March 31, 2010 10:43 AM) CC: c/o headaches and weakness - needs work note Is Patient Diabetic? No   Primary Care Provider:  Gordy Savers  MD  CC:  c/o headaches and weakness - needs work note.  History of Present Illness: 55 year old patient who has a history of migraine headaches, tobacco use, mild obesity, and dyslipidemia.  For the past two days.  She has had headaches and the weakness.  She has been unable to work due to the malaise and headaches.  Her chronic back pain has been stable and in general, her, migraines, have been stable.  Allergies (verified): No Known Drug Allergies  Past History:  Past Medical History: Reviewed history from 08/10/2009 and no changes required. 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  Was also receiving ideas of reference. (see office note) hysterectomy was performed because of cervical dysplasia and fibroids but no overt cancer.  Per operative note of 08/1999 [facility]. long history of history of  anxiety disorder history of borderline hypercholesterolemia chronic constipation Diverticulosis, colon  Review of Systems       The patient complains of headaches and muscle weakness.  The patient denies anorexia, fever, weight loss, weight gain, vision loss, decreased hearing, hoarseness, chest pain, syncope, dyspnea on exertion, peripheral edema, prolonged cough, hemoptysis, abdominal pain, melena, hematochezia, severe indigestion/heartburn, hematuria, incontinence, genital sores, suspicious skin lesions, transient blindness, difficulty walking, depression, unusual weight change, abnormal bleeding, enlarged lymph nodes, angioedema, and breast masses.    Physical  Exam  General:  overweight-appearing.  normal blood pressure Head:  Normocephalic and atraumatic without obvious abnormalities. No apparent alopecia or balding. Eyes:  No corneal or conjunctival inflammation noted. EOMI. Perrla. Funduscopic exam benign, without hemorrhages, exudates or papilledema. Vision grossly normal. Ears:  External ear exam shows no significant lesions or deformities.  Otoscopic examination reveals clear canals, tympanic membranes are intact bilaterally without bulging, retraction, inflammation or discharge. Hearing is grossly normal bilaterally. Mouth:  Oral mucosa and oropharynx without lesions or exudates.  Teeth in good repair. Neck:  No deformities, masses, or tenderness noted. Chest Wall:  No deformities, masses, or tenderness noted. Lungs:  Normal respiratory effort, chest expands symmetrically. Lungs are clear to auscultation, no crackles or wheezes. Heart:  Normal rate and regular rhythm. S1 and S2 normal without gallop, murmur, click, rub or other extra sounds.   Impression & Recommendations:  Problem # 1:  MIGRAINE, COMMON (ICD-346.10)  The following medications were removed from the medication list:    Vicodin 5-500 Mg Tabs (Hydrocodone-acetaminophen) .Marland Kitchen... 1 by mouth two times a day prn Her updated medication list for this problem includes:    Hydrocodone-acetaminophen 7.5-750 Mg Tabs (Hydrocodone-acetaminophen) ..... One every 6 hours as needed for pain  Problem # 2:  OBESITY, NOS (ICD-278.00)  Complete Medication List: 1)  Amitriptyline Hcl 100 Mg Tabs (Amitriptyline hcl) .Marland Kitchen.. 1 once daily as needed 2)  Clonazepam 0.5 Mg Tabs (Clonazepam) .... One or two at bedtime as needed 3)  Phentermine Hcl 37.5 Mg Caps (Phentermine hcl) .... One daily 4)  Hydrocodone-acetaminophen 7.5-750 Mg Tabs (Hydrocodone-acetaminophen) .... One every 6 hours as needed for pain  Patient Instructions: 1)  Get plenty of rest, drink lots of clear liquids, and use Tylenol or  Ibuprofen for fever and comfort. Return in 7-10 days if you're not better:sooner if you're feeling worse. Prescriptions: HYDROCODONE-ACETAMINOPHEN 7.5-750 MG TABS (HYDROCODONE-ACETAMINOPHEN) one every 6 hours as needed for pain  #50 x 2   Entered and Authorized by:   Gordy Savers  MD   Signed by:   Gordy Savers  MD on 03/31/2010   Method used:   Print then Give to Patient   RxID:   [id number] PHENTERMINE HCL 37.5 MG CAPS (PHENTERMINE HCL) one daily  #30 x 1   Entered and Authorized by:   Gordy Savers  MD   Signed by:   Gordy Savers  MD on 03/31/2010   Method used:   Print then Give to Patient   RxID:   [id number] CLONAZEPAM 0.5 MG TABS (CLONAZEPAM) one or two at bedtime as needed  #60 x 3   Entered and Authorized by:   Gordy Savers  MD   Signed by:   Gordy Savers  MD on 03/31/2010   Method used:   Print then Give to Patient   RxID:   [id number] AMITRIPTYLINE HCL 100 MG TABS (AMITRIPTYLINE HCL) 1 once daily as needed  #90 x 4   Entered and Authorized by:   Gordy Savers  MD   Signed by:   Gordy Savers  MD on 03/31/2010   Method used:   Print then Give to Patient   RxID:   [id number]    Orders Added: 1)  Est. Patient Level II [18841]   Immunization History:  Tetanus/Td Immunization History:    Tetanus/Td:  Done. (12/22/2002)  Influenza Immunization History:    Influenza:  Historical (03/06/2010)   Immunization History:  Influenza Immunization History:    Influenza:  Historical (03/06/2010) was given at work [employer]

## 2010-06-22 NOTE — Assessment & Plan Note (Signed)
Summary: arm numbness/foot/njr   Vital Signs:  Patient profile:   55 year old female Weight:      186 pounds Temp:     98.4 degrees F oral BP sitting:   110 / 70  (left arm) Cuff size:   regular  Vitals Entered By: Duard Brady LPN (April 30, 2010 4:28 PM) CC: c/o pulled muscle (R) shoulder, now with numbness , pain in (L) leg and (R) foot Is Patient Diabetic? No   Primary Care Provider:  Gordy Savers  MD  CC:  c/o pulled muscle (R) shoulder, now with numbness , and pain in (L) leg and (R) foot.  History of Present Illness: 55 year old patient who has a history of chronic low back pain.  Comparison include pain over the dorsal aspect of her left foot for the past several days and also had pain involving her right shoulder and has experienced numbness involving her right hand.  She has a history of dyslipidemia tobacco dependence, chronic anxiety  Allergies (verified): No Known Drug Allergies  Past History:  Past Medical History: Reviewed history from 08/10/2009 and no changes required. 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  Was also receiving ideas of reference. (see office note) hysterectomy was performed because of cervical dysplasia and fibroids but no overt cancer.  Per operative note of 08/1999 [facility]. long history of history of  anxiety disorder history of borderline hypercholesterolemia chronic constipation Diverticulosis, colon  Past Surgical History: Reviewed history from 09/15/2009 and no changes required. 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 (refer to Gyn) -, Colposcopy 9/97 = neg -, CT of head - 06/23/2000, dopplers, carotid - 06/23/2000, Hysterectomy - Partial (pre-cancer cells) - 11/16/2001, Lap Banding - 09/21/2003, LFT 2/00 = WNL -, Pilonidal cyst resect 1980 - colonoscopy August 2008  Review of Systems  The patient denies anorexia, fever, weight loss, weight gain, vision loss, decreased  hearing, hoarseness, chest pain, syncope, dyspnea on exertion, peripheral edema, prolonged cough, headaches, hemoptysis, abdominal pain, melena, hematochezia, severe indigestion/heartburn, hematuria, incontinence, genital sores, muscle weakness, suspicious skin lesions, transient blindness, difficulty walking, depression, unusual weight change, abnormal bleeding, enlarged lymph nodes, angioedema, and breast masses.    Physical Exam  General:  Well-developed,well-nourished,in no acute distress; alert,appropriate and cooperative throughout examination; normal blood pressure, no distress Eyes:  No corneal or conjunctival inflammation noted. EOMI. Perrla. Funduscopic exam benign, without hemorrhages, exudates or papilledema. Vision grossly normal. Mouth:  Oral mucosa and oropharynx without lesions or exudates.  Teeth in good repair. Neck:  No deformities, masses, or tenderness noted. Lungs:  Normal respiratory effort, chest expands symmetrically. Lungs are clear to auscultation, no crackles or wheezes. Msk:  no tenderness over the dorsal aspect of the left foot.  No swelling or any inflammatory changes.  Flexion of the toes elicited.  No pain  No tenderness involving the right shoulder.  There is full range of motion with some mild subjective discomfort  Tinels- involving the right wrist.  Negative good grip strength   Impression & Recommendations:  Problem # 1:  SHOULDER PAIN, RIGHT (ICD-719.41)  The following medications were removed from the medication list:    Hydrocodone-acetaminophen 7.5-750 Mg Tabs (Hydrocodone-acetaminophen) ..... One every 6 hours as needed for pain Her updated medication list for this problem includes:    Hydrocodone-acetaminophen 10-325 Mg Tabs (Hydrocodone-acetaminophen) ..... One every 8 hours as needed for pain  Problem # 2:  FOOT PAIN, LEFT (ICD-729.5)  Complete Medication List: 1)  Amitriptyline Hcl 100 Mg Tabs (Amitriptyline hcl) .Marland Kitchen.. 1 once daily as  needed 2)  Clonazepam 0.5 Mg Tabs (Clonazepam) .... One or two at bedtime as needed 3)  Phentermine Hcl 37.5 Mg Caps (Phentermine hcl) .... One daily 4)  Hydrocodone-acetaminophen 10-325 Mg Tabs (Hydrocodone-acetaminophen) .... One every 8 hours as needed for pain  Patient Instructions: 1)  Please schedule a follow-up appointment in 4 months. 2)  Limit your Sodium (Salt) to less than 2 grams a day(slightly less than 1/2 a teaspoon) to prevent fluid retention, swelling, or worsening of symptoms. 3)  It is important that you exercise regularly at least 20 minutes 5 times a week. If you develop chest pain, have severe difficulty breathing, or feel very tired , stop exercising immediately and seek medical attention. 4)  Take 400-600mg  of Ibuprofen (Advil, Motrin) with food every 4-6 hours as needed for relief of pain or comfort of fever. Prescriptions: HYDROCODONE-ACETAMINOPHEN 10-325 MG TABS (HYDROCODONE-ACETAMINOPHEN) one every 8 hours as needed for pain  #90 x 3   Entered and Authorized by:   Gordy Savers  MD   Signed by:   Gordy Savers  MD on 04/30/2010   Method used:   Print then Give to Patient   RxID:   [id number]    Orders Added: 1)  Est. Patient Level III [52841]

## 2010-06-22 NOTE — Assessment & Plan Note (Signed)
Summary: ?ibs/njr    Vital Signs:  Patient profile:   55 year old female Weight:      185 pounds Temp:     98.6 degrees F oral BP sitting:   120 / 80  (left arm) Cuff size:   regular  Vitals Entered By: Duard Brady LPN (September 15, 2009 1:44 PM) CC: hx constipation and cramps - wants GI referral Is Patient Diabetic? No   Primary Care Provider:  Gordy Savers  MD  CC:  hx constipation and cramps - wants GI referral.  History of Present Illness: 55 year old patientwho is seen today for follow-up of her constipation.  She was seen here one month ago and stated that she had had no bowel movement in excess of two months. She was placed on a high fiber diet liberal fluid intake more vigorous  exercise regimen and MiraLax.  She states she has had 3 bowel movements over the past month. Her lawyer also stated that she consult a gastroenterologist due to a class action suit concerning Accutane and inflammatory bowel disease.  Apparently, the patient to Accutane for a few years and has remote history of what she describes as inflammatory bowel disease 20 years ago.  A colonoscopy in August of 2008 was normal.  She complains of intermittent crampy abdominal pain.  The to constipation issues, tramadol  was substituted for Vicodin and amitriptyline discontinued  Preventive Screening-Counseling & Management  Alcohol-Tobacco     Smoking Status: current  Allergies: No Known Drug Allergies  Past History:  Past Medical History: Reviewed history from 08/10/2009 and no changes required. 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  Was also receiving ideas of reference. (see office note) hysterectomy was performed because of cervical dysplasia and fibroids but no overt cancer.  Per operative note of 08/1999 [facility]. 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  The patient denies anorexia, fever, weight loss, weight gain, vision loss, decreased hearing, hoarseness, chest pain, syncope, dyspnea on exertion, peripheral edema, prolonged cough, headaches, hemoptysis, melena, hematochezia, severe indigestion/heartburn, hematuria, incontinence, genital sores, muscle weakness, suspicious skin lesions, transient blindness, difficulty walking, depression, unusual weight change, abnormal bleeding, enlarged lymph nodes, angioedema, and breast masses.    Physical Exam  General:  overweight-appearing.  normal blood pressure, no distressoverweight-appearing.   Head:  Normocephalic and atraumatic without obvious abnormalities. No apparent alopecia or balding. Mouth:  Oral mucosa and oropharynx without lesions or exudates.  Teeth in good repair. Neck:  No deformities, masses, or tenderness noted. Lungs:  Normal respiratory effort, chest expands symmetrically. Lungs are clear to auscultation, no crackles or wheezes. Heart:  Normal rate and regular rhythm. S1 and S2 normal without gallop, murmur, click, rub or other extra sounds. Abdomen:  obese soft and nontender.  No organomegaly.  No distention.  Bowel sounds normal   Impression & Recommendations:  Problem # 1:  CONSTIPATION, CHRONIC (ICD-564.09)  Problem # 2:  OBESITY, NOS (ICD-278.00)  Problem # 3:  ABDOMINAL PAIN,  RIGHT LOWER QUADRANT (ICD-789.03)  Complete Medication List: 1)  Amitriptyline Hcl 100 Mg Tabs (Amitriptyline hcl) .Marland Kitchen.. 1 once daily as needed 2)  Tramadol Hcl 50 Mg Tabs (Tramadol hcl) .... One every 6 hours  or as needed for pain 3)  Clonazepam 0.5 Mg Tabs (Clonazepam) .... One or two at bedtime as needed  Other Orders: Gastroenterology Referral (GI)  Patient Instructions: 1)  Drink as much fluid as you can tolerate for the next few days. 2)  It is important that you exercise regularly at least 20 minutes 5 times a week. If you develop chest pain, have severe difficulty breathing, or feel very tired , stop exercising immediately and seek medical attention. 3)  high-fiber diet 4)  increase MiraLax to twice daily

## 2010-06-22 NOTE — Letter (Signed)
Summary: Out of Work  Adult nurse at [employer]  [address]   [place], [place] [address]   Phone: [phone]  Fax: [phone]    March 31, 2010   Employee:  ZERLINE MELCHIOR    To Whom It May Concern:   For Medical reasons, please excuse the above named employee from work for the following dates:  Start:   03-31-10  End:   04-05-10  If you need additional information, please feel free to contact our office.         Sincerely,    Gordy Savers  MD

## 2010-06-22 NOTE — Progress Notes (Signed)
Summary:  refill of Vicodin   Phone Note Call from Patient Call back at [facility] Phone [phone]   Caller: Patient Summary of Call: Pt called and is req a refill of Vicodin 5-500mg . [facility] of the office and couldnt fill her med. [facility] [address]   Initial call taken by: Lucy Antigua,  October 27, 2009 2:44 PM  Follow-up for Phone Call        [facility] Follow-up by: Duard Brady LPN,  October 27, 2128 5:16 PM    New/Updated Medications: VICODIN 5-500 MG TABS (HYDROCODONE-ACETAMINOPHEN) 1 by mouth two times a day prn Prescriptions: VICODIN 5-500 MG TABS (HYDROCODONE-ACETAMINOPHEN) 1 by mouth two times a day prn  #50 x 0   Entered by:   Duard Brady LPN   Authorized by:   Gordy Savers  MD   Signed by:   Duard Brady LPN on 86/57/8469   Method used:   Historical   RxID:   [id number]  #50

## 2010-06-22 NOTE — Progress Notes (Signed)
Summary: Skin medication  Phone Note Call from Patient Call back at Home Phone [phone]   Caller: Patient Reason for Call: Refill Medication Summary of Call: Patient wants a skin medication Roetta Sessions) called in.   Initial call taken by: Everrett Coombe,  August 24, 2009 10:02 AM  Follow-up for Phone Call        Pt called in again req skin med called in asap.  Follow-up by: Lucy Antigua,  August 24, 2009 11:10 AM  Additional Follow-up for Phone Call Additional follow up Details #1::        Dr. Frederica Kuster out of office this week - may need to be seen, call back next week tp make appt. KIK Additional Follow-up by: Duard Brady LPN,  August 24, 2009 4:28 PM

## 2010-06-22 NOTE — Progress Notes (Signed)
Summary: refill  denied  Phone Note Refill Request Message from:  Fax from Pharmacy on November 02, 2009 4:48 PM  Refills Requested: Medication #1:  VICODIN 5-500 MG TABS 1 by mouth two times a day prn.   [facility]: [facility] [address] [phone]   Method Requested: Telephone to Pharmacy Initial call taken by: Duard Brady LPN,  November 02, 2009 4:55 PM  Follow-up for Phone Call        this was refilled 6 days ago Follow-up by: Gordy Savers  MD,  November 02, 2009 5:06 PM  Additional Follow-up for Phone Call Additional follow up Details #1::        [facility] - [facility] - was filled 6 days ago - should still have med. KIK Additional Follow-up by: Duard Brady LPN,  November 02, 2009 5:36 PM

## 2010-06-22 NOTE — Progress Notes (Signed)
Summary: REQ FOR RX (POST OV)    Caller: Patient [phone] Reason for Call: Talk to Nurse, Talk to Doctor Summary of Call: Pt stated that she was supposed to have a Rx for med (antibiotic / cream) to treat vaginal infection (sharp, contracting pain in area) but was not given same.... Pt adv [facility] [address] [place].  Pt can be reached at [phone] when same has been taken care of.  Initial call taken by: Debbra Riding,  August 10, 2009 2:42 PM    Additional Follow-up for Phone Call Additional follow up Details #2::    called pt - aware med [facility] [address] . KIK Follow-up by: Duard Brady LPN,  August 10, 2009 5:31 PM  diflucan 150 mg  one time dose

## 2010-06-22 NOTE — Letter (Signed)
Summary: New Patient letter  [facility]  [address] [place], [place] [address]   Phone: [phone]  Fax: [phone]       02/11/2010 MRN: [id number]  Lauren Campos [address] [place], [place]  [address]  Dear Lauren Campos,  Welcome to the Gastroenterology Division at [facility].    You are scheduled to see Dr.  Sheryn Bison on March 30, 2010 at 10:00am on the 3rd floor at [facility], [address]  We ask that you try to arrive at our office 15 minutes prior to your appointment time to allow for check-in.  We would like you to complete the enclosed self-administered evaluation form prior to your visit and bring it with you on the day of your appointment.  We will review it with you.  Also, please bring a complete list of all your medications or, if you prefer, bring the medication bottles and we will list them.  Please bring your insurance card so that we may make a copy of it.  If your insurance requires a referral to see a specialist, please bring your referral form from your primary care physician.  Co-payments are due at the time of your visit and may be paid by cash, check or credit card.     Your office visit will consist of a consult with your physician (includes a physical exam), any laboratory testing he/she may order, scheduling of any necessary diagnostic testing (e.g. x-ray, ultrasound, CT-scan), and scheduling of a procedure (e.g. Endoscopy, Colonoscopy) if required.  Please allow enough time on your schedule to allow for any/all of these possibilities.    If you cannot keep your appointment, please call [phone] to cancel or reschedule prior to your appointment date.  This allows [place] the opportunity to schedule an appointment for another patient in need of care.  If you do not cancel or reschedule by 5 p.m. the business day prior to your appointment date, you will be charged a $50.00 late cancellation/no-show fee.     Thank you for choosing [facility] for your medical needs.  We appreciate the opportunity to care for you.  Please visit [place] at our website  to learn more about our practice.                     Sincerely,                                                             The Gastroenterology Division

## 2010-06-22 NOTE — Letter (Signed)
Summary: Physician Clearance Form/Tobacco Cessation program  Physician Clearance Form/Tobacco Cessation program   Imported By: Maryln Gottron 06/23/2009 15:13:03  _____________________________________________________________________  External Attachment:    Type:   Image     Comment:   External Document

## 2010-07-08 ENCOUNTER — Telehealth: Payer: Self-pay

## 2010-07-08 MED ORDER — HYDROCODONE-ACETAMINOPHEN 7.5-750 MG PO TABS: 1.0000 | ORAL_TABLET | Freq: Four times a day (QID) | ORAL | Status: DC | PRN

## 2010-07-08 NOTE — Telephone Encounter (Signed)
No further rf until 08-30-10

## 2010-07-08 NOTE — Telephone Encounter (Signed)
[facility] - [facility] - was given printed rx at 04/30/10 office vist - denied until 08/30/10

## 2010-07-15 ENCOUNTER — Telehealth (INDEPENDENT_AMBULATORY_CARE_PROVIDER_SITE_OTHER): Payer: Self-pay | Admitting: *Deleted

## 2010-07-19 ENCOUNTER — Telehealth (INDEPENDENT_AMBULATORY_CARE_PROVIDER_SITE_OTHER): Payer: Self-pay | Admitting: *Deleted

## 2010-07-20 NOTE — Progress Notes (Signed)
Summary: questions  Phone Note Call from Patient Call back at [phone]   Caller: Patient Call For: Dr.  Jaquita Rector for Call: Talk to Nurse Summary of Call: Pt is calling to schedule a colon that will be paid for by her lawyers office, they are requesting she have the procedure so they can have the medical records for her case against another doctor that put her on accutaine  Initial call taken by: [place] Johnson,  July 15, 2010 10:51 AM  Follow-up for Phone Call        pt. called back and said "not to worry about it" Follow-up by: Karna Christmas,  July 15, 2010 1:10 PM

## 2010-07-29 NOTE — Progress Notes (Signed)
  Phone Note Other Incoming   Request: Send information Summary of Call: Patient completed a [place] medical release for [employer]  to receive a copy of her colon report.Request forwarded to [employer].

## 2010-08-05 ENCOUNTER — Other Ambulatory Visit: Payer: Self-pay | Admitting: Internal Medicine

## 2010-08-17 ENCOUNTER — Other Ambulatory Visit: Payer: Self-pay | Admitting: Internal Medicine

## 2010-08-23 LAB — LIPID PANEL
LDL Cholesterol: 102 mg/dL — ABNORMAL HIGH (ref 0–99)
VLDL: 33 mg/dL (ref 0–40)

## 2010-08-23 LAB — CK TOTAL AND CKMB (NOT AT ARMC)
CK, MB: 0.6 ng/mL (ref 0.3–4.0)
Relative Index: INVALID (ref 0.0–2.5)
Relative Index: INVALID (ref 0.0–2.5)
Total CK: 33 U/L (ref 7–177)
Total CK: 35 U/L (ref 7–177)
Total CK: 43 U/L (ref 7–177)

## 2010-08-23 LAB — POCT I-STAT, CHEM 8
BUN: 10 mg/dL (ref 6–23)
Calcium, Ion: 1.33 mmol/L — ABNORMAL HIGH (ref 1.12–1.32)
Chloride: 102 mEq/L (ref 96–112)
Glucose, Bld: 87 mg/dL (ref 70–99)
Potassium: 4.4 mEq/L (ref 3.5–5.1)

## 2010-08-23 LAB — DIFFERENTIAL
Lymphocytes Relative: 31 % (ref 12–46)
Lymphs Abs: 2.9 10*3/uL (ref 0.7–4.0)
Monocytes Absolute: 0.6 10*3/uL (ref 0.1–1.0)
Monocytes Relative: 7 % (ref 3–12)
Neutro Abs: 5.8 10*3/uL (ref 1.7–7.7)
Neutrophils Relative %: 60 % (ref 43–77)

## 2010-08-23 LAB — URINALYSIS, ROUTINE W REFLEX MICROSCOPIC
Bilirubin Urine: NEGATIVE
Glucose, UA: NEGATIVE mg/dL
Hgb urine dipstick: NEGATIVE
Ketones, ur: NEGATIVE mg/dL
Protein, ur: NEGATIVE mg/dL

## 2010-08-23 LAB — POCT CARDIAC MARKERS
CKMB, poc: <1 ng/mL — ABNORMAL LOW (ref 1.0–8.0)
Troponin i, poc: <0.05 ng/mL (ref 0.00–0.09)
Troponin i, poc: <0.05 ng/mL (ref 0.00–0.09)

## 2010-08-23 LAB — HEMOGLOBIN A1C: Hgb A1c MFr Bld: 5.8 % (ref 4.6–6.1)

## 2010-08-23 LAB — CBC
Hemoglobin: 12.6 g/dL (ref 12.0–15.0)
RBC: 4.08 MIL/uL (ref 3.87–5.11)
WBC: 9.6 10*3/uL (ref 4.0–10.5)

## 2010-08-23 LAB — TROPONIN I
Troponin I: 0.01 ng/mL (ref 0.00–0.06)
Troponin I: <0.01 ng/mL (ref 0.00–0.06)

## 2010-08-23 LAB — APTT: aPTT: 26 seconds (ref 24–37)

## 2010-09-07 ENCOUNTER — Other Ambulatory Visit: Payer: Self-pay | Admitting: Internal Medicine

## 2010-09-07 DIAGNOSIS — Z1231 Encounter for screening mammogram for malignant neoplasm of breast: Secondary | ICD-10-CM

## 2010-09-10 ENCOUNTER — Ambulatory Visit
Admission: RE | Admit: 2010-09-10 | Discharge: 2010-09-10 | Disposition: A | Discharge status: Home / Self Care | Payer: Self-pay | Source: Ambulatory Visit | Attending: Internal Medicine | Admitting: Internal Medicine

## 2010-09-10 DIAGNOSIS — Z1231 Encounter for screening mammogram for malignant neoplasm of breast: Secondary | ICD-10-CM

## 2010-09-16 ENCOUNTER — Other Ambulatory Visit: Payer: Self-pay | Admitting: Internal Medicine

## 2010-10-08 NOTE — Discharge Summary (Signed)
[facility]  Patient:    Lauren Campos, Lauren Campos                    MRN: [id number] Adm. Date:  [id number] Disc. Date: [id number] Attending:  Frederich Balding                           Discharge Summary  ADMISSION DIAGNOSES:          1. Severe cervical dysplasia, recurrent.                               2. Uterine fibroids.  DISCHARGE DIAGNOSES:          1. Severe cervical dysplasia, recurrent.                               2. Uterine fibroids.  PROCEDURE:                    Laparoscopically-assisted vaginal hysterectomy.  HISTORY OF PRESENT ILLNESS:   For complete history and physical, please see dictated note.  HOSPITAL COURSE:              The patient underwent the above noted surgery. Pathology is still pending at the present time.  Postoperatively, the patient did extremely well.  Postoperatively, her hemoglobin was 11.4, it had been 13.3, preoperatively.  She was discharged home on her second postoperative day.  At that time, she was afebrile.  Abdomen was soft and nontender.  All incisions were intact.  She had normal bowel and bladder function.  She had minimal vaginal spotting at that point in time.  COMPLICATIONS:                None were encountered during her stay in the hospital.  The patient was discharged home in stable condition.  DISPOSITION:                  Routine postoperative instructions and orders were given.  She is to watch for signs of infection, nausea and vomiting, increasing  pain, or active vaginal bleeding.  She is to avoid heavy lifting, vaginal entrance, or driving a car.  DISCHARGE MEDICATIONS:        1. Demerol as she needs for pain.                               2. Iron sulfate supplementation.  FOLLOW-UP:                    Reassessment in the office in one week. DD:  08/28/99 TD:  08/28/99 Job: [id number]

## 2010-10-08 NOTE — Op Note (Signed)
NAME:  Lauren Campos, Lauren Campos                       ACCOUNT NO.:  [id number]   MEDICAL RECORD NO.:  [id number]                   PATIENT TYPE:  OBV   LOCATION:  0478                                 FACILITY:  [facility]   PHYSICIAN:  Thornton Park. Daphine Deutscher, M.D.             DATE OF BIRTH:  [birth year]   DATE OF PROCEDURE:  12/09/2003  DATE OF DISCHARGE:                                 OPERATIVE REPORT   PREOPERATIVE DIAGNOSIS:  Morbid obesity with body mass index of 46.   PROCEDURE:  Laparoscopic Inamed laparoscopic gastric banding.   SURGEON:  Thornton Park. Daphine Deutscher, M.D.   ASSISTANT:  Sandria Bales. Ezzard Standing, M.D.   ANESTHESIA:  General endotracheal.   DESCRIPTION OF PROCEDURE:  Ms. Rumsey was taken back to room #1 on December 09, 2003 and given general anesthesia.  The abdomen was prepped with Betadine  and draped sterilely.  First, I used an Optiview #12 port in the left upper  quadrant using the 0 degree scope and entered the abdomen without  difficulty.  The abdomen was insufflated.  Subsequently, a 5 mm was placed  lateral to that.  A 10/11 was down on the left side of the umbilicus and two  12s in the right upper quadrant as well as a 5 mm in the upper midline.  The  patient admitted that she had only been able to do about one week of the low  carb diet to try to get her liver to shrink, and her liver was, in fact,  large.  The ports were placed.  The enlarged liver was retracted.  For this,  the [facility] retractor was used and affixed to the table.  First, I incised  the peritoneal reflection over the left crus, maintaining a little bit of  peritoneal reflection laterally towards the spleen.  I used a finger from  anteriorly to help dissect along the stomach.  Next, I went to the patient's  right side and incised the pars flaccida and went along the right crus to  the fat pad that encroaches and seen across the lateral border posteriorly  and used that as a point to begin the dissection for the band  to be placed.  I used a finger retractor, which I inserted from the lateral-most port on  the right, and then it passed easily behind the stomach, and then I began  angling the finger forward.  Initially, it looked like it was going down  more towards the spleen, and I angled it up.  As it came out, I was able to  deliver it under pretty much direct vision through the fat.  I left that in  place.   The left upper quadrant 12 port was dilated with my finger and the band  introducer device.  It enabled me to bring the band in without difficulty.  It was brought in completely into the abdomen, and then a tip was placed  into the band deliverer, and it was pulled around.  This was a tin band,  which we felt was adequate for this area.  The band was then engaged through  the locking device and pulled around to the first snapping port to hold it  in place.  The band calibrator was then passed into the stomach, blown up,  pulled back, and it seemed like the tube easily passed into the stomach.  The band was then snapped down, and the tube was removed easily after  deflating the balloon.  With that removed, I then began placing three 2-0  braided polyester sutures.  Because of her obesity and the size of the  liver, this did hamper this somewhat, but I began with a horizontally placed  stitch on account of the left lateral side, to try to tack up that angle,  and I got good bites out of the proximal stomach and then tied these down  with the tie notch.  This seemed to create a good, snug closure, but there  was some redundancy in this plication, and it did not seem to be under too  much tension.  Two more sutures were placed, again getting a good purchase  of the plicated stomach and then tacking it onto the stomach anteriorly.  These were all tied down and appeared to be in good position.  There was  some bleeding from needle trauma to the under-surface of the enlarged left  lateral segment.   This was minimal.  When these were completed, and we had  been holding the buckle over, it seemed to move easily.  The sutures were  kept away from the buckle.   The catheter was brought out through the right medial upper port and brought  out to the skin.  Everything inside looked fine.  I removed the [place]  retractor under direct vision and decompressed the abdomen and removed the  other ports.  The band port was placed by extending this incision laterally  and dissecting off the fascia and then placing four sutures of 2-0 Prolene,  threading them down onto the hubs of the port and then tying this down.  It  laid nicely.  I used the standard splicer to splice the port tubing onto the  tubing that was connected to the band.  These had been previously flushed.  It laid nicely into the fascia, and this area was irrigated and closed with  4-0 Vicryl, Benzoin, and Steri-Strips.  All ports were injected with  Marcaine and were closed with 4-0 Vicryl with Benzoin and Steri-Strips.  The  patient seemed to tolerate the procedure well and was taken to the recovery  room in satisfactory condition.                                               Thornton Park Daphine Deutscher, M.D.    MBM/MEDQ  D:  12/09/2003  T:  12/09/2003  Job:  [id number]   cc:   Billey Gosling, M.D.  [address] [place], [place] [address]  Fax: [phone]

## 2010-10-08 NOTE — Op Note (Signed)
[facility]  Patient:    Lauren Campos, Lauren Campos                    MRN: [id number] Proc. Date: 08/26/99 Adm. Date:  [id number] Disc. 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  OPERATIVE PROCEDURE:          Laparoscopically-assisted vaginal hysterectomy.  SURGEON:                      Juluis Mire, M.D.  ASSISTANT:                    Trevor Iha, M.D.  ANESTHESIA:                   General endotracheal.  ESTIMATED BLOOD LOSS:         400 cc.  PACKS AND DRAINS:             Urethral Foley.  INTRAOPERATIVE BLOOD REPLACED:                     None.  COMPLICATIONS:                None.  INDICATIONS FOR PROCEDURE:    Dictated in the history and physical.  DESCRIPTION OF THE PROCEDURE: The patient was taken to the OR and placed in the supine position.  After a satisfactory level of general endotracheal was obtained, the patient was placed in the dorsal supine position using Allen stirrups.  The abdomen, perineum, and vagina were prepped out with Betadine. The bladder was emptied by catheterization.  Exam under anesthesia revealed the uterus approximately 10-11 weeks in size.  A speculum was placed in the vaginal vault.  The cervix was grasped with a single-tooth tenaculum.  The halter traction was then put in place.  The single-tooth tenaculum and speculum were then removed.  The patient was then draped out for laparoscopy. A subumbilical incision was made with a knife.  A Veress needle was then inserted to the abdominal cavity.  The abdomen was inflated with approximately 2 liters of carbon dioxide.  The operating laparoscope was then reintroduced. There was no evidence of injury to adjacent  organs.  The uterus was enlarged with a large fundal fibroid.  Previous bilateral tubal ligation was noted. Ovaries was unremarkable.  There was one omental adhesion to the anterior of abdominal wall that was cauterized and taken down.  The 5 mm suprapubic trocars were put in place under direct visualization.  We were then able to manipulate the uterus.  The cul-de-sac was clear.  The appendix was visualized and noted to be unremarkable, upper abdomen including the liver, tip of the gallbladder, and both lateral gutters were clear.  Two more trocars were placed in the right and left lower quadrant after visualization of the epigastric vessels.  At this point in time, the round ligament was identified, completely  coagulated and incised.  The tube and the mesosalpinx on the right side were also cauterized and incised.  Following the utero-ovarian pedicle was identified, completely cauterized, and incised.  There was some bleeding from the uterine vasculature on the uterine side and this was brought under control with a bipolar.  We had good separation on incising and we could easily see the ureter on the right and noted that it was uninvolved in the operative procedure.  We then went to the left side and the left round ligament was cauterized and incised.  The left tube and mesosalpinx was cauterized and incised.  The left utero-ovarian pedicle was cauterized and incised.  We had excellent hemostasis and this time again, the ureter could be seen in the pelvic sidewall and is uninvolved in the operative procedure.  At this point in time, we had excellent hemostasis.  Attention was now turned to the vaginal probes.  The abdomen was deflated of its carbon dioxide.  The laparoscope was then removed.  The patients leg was repositioned.  A weighted speculum was placed in the vaginal vault.  The cervix was grasped with a Jacobs tenaculum intact.  The cul-de-sac was entered sharply.  Both  ureterosacral ligaments were clamped, cut, and suture ligated with 0 Vicryl with this being held.  Reflection of the vaginal mucosa anteriorly was incised.  The paracervical tissue was identified, clamped, cut, and suture ligated with 0 Vicryl.  The bladder was advanced superiorly. The vesicouterine space was entered sharply and a retractor was put in place to retract the bladder superiorly.  Using the clamp, cut, and tie technique with suture ligatures of 0 Vicryl, the parametrium, is serially separated from the sides of the uterus.  At this point in time, we could not get any further descent due to the fibroids.  We morcellated the cervical area and part of the lower uterine segment.  We then began rotating the fundus from posterior to anteriorly doing wedge resections of this.  At this point in time, we were able to deliver the rest of the fundus through the vaginal opening.  The remaining pedicles were clamped, cut, and the main parts of the uterus were passed off the operative field.  The pedicles were suture ligated with 0 Vicryl.  Next, the posterior vaginal cuff was run with a running-locking suture of 0 Vicryl.  Areas of bleeding were brought under control with figure-of-eights and 0 Vicryl.  With this, we had good hemostasis.  Foley was placed to straight drainage with an adequate amount of clear urine.  The vaginal mucosa was closed with the midline in a vertical fashion with interrupted suture ligatures of 2-0 Vicryl. A spatula sponge stick was placed in the vaginal vault.  The weighted speculum was then removed.  The patients leg was repositioned.  The abdomen was reinflated with carbon dioxide.  Visualization revealed excellent hemostasis of the vaginal cuff and both ovaries.  We thoroughly irrigated the pelvis.  Irrigation was removed. At this point in time, the abdomen was deflated from the carbon dioxide.  All trocars were removed.  Subumbilical incision was closed with  interrupted subcuticulars with 4-0 Vicryl.  The suprapubic incision was closed Steri-Strips.  The patient was taken out the dorsolithotomy position, once alert and extubated and transferred to the recovery room in good condition.   Sponge, instrument, and needle count was correct by the circulating nurse x 2. Urine output remained clear. DD:  08/26/99 TD:  08/27/99 Job: [id number]

## 2010-10-19 ENCOUNTER — Ambulatory Visit: Payer: Self-pay

## 2010-10-19 ENCOUNTER — Other Ambulatory Visit: Payer: Self-pay | Admitting: Occupational Medicine

## 2010-10-19 DIAGNOSIS — R52 Pain, unspecified: Secondary | ICD-10-CM

## 2011-04-10 ENCOUNTER — Other Ambulatory Visit: Payer: Self-pay | Admitting: Internal Medicine

## 2011-04-20 ENCOUNTER — Telehealth: Payer: Self-pay

## 2011-04-20 NOTE — Telephone Encounter (Signed)
Opened in error

## 2011-05-16 ENCOUNTER — Ambulatory Visit (INDEPENDENT_AMBULATORY_CARE_PROVIDER_SITE_OTHER): Payer: Self-pay | Admitting: Internal Medicine

## 2011-05-16 ENCOUNTER — Ambulatory Visit: Payer: Self-pay | Admitting: Internal Medicine

## 2011-05-16 ENCOUNTER — Encounter: Payer: Self-pay | Admitting: Internal Medicine

## 2011-05-16 DIAGNOSIS — M545 Low back pain, unspecified: Secondary | ICD-10-CM

## 2011-05-16 DIAGNOSIS — E669 Obesity, unspecified: Secondary | ICD-10-CM

## 2011-05-16 DIAGNOSIS — E785 Hyperlipidemia, unspecified: Secondary | ICD-10-CM

## 2011-05-16 DIAGNOSIS — G4700 Insomnia, unspecified: Secondary | ICD-10-CM

## 2011-05-16 DIAGNOSIS — F17200 Nicotine dependence, unspecified, uncomplicated: Secondary | ICD-10-CM

## 2011-05-16 MED ORDER — CLONAZEPAM 0.5 MG PO TABS: 0.5000 mg | ORAL_TABLET | Freq: Two times a day (BID) | ORAL | Status: DC

## 2011-05-16 MED ORDER — TRAMADOL HCL 50 MG PO TABS: 50.0000 mg | ORAL_TABLET | Freq: Four times a day (QID) | ORAL | Status: DC | PRN

## 2011-05-16 MED ORDER — BETAMETHASONE VALERATE 0.1 % EX LOTN: TOPICAL_LOTION | Freq: Every day | CUTANEOUS | Status: DC

## 2011-05-16 NOTE — Patient Instructions (Signed)
You need to lose weight.  Consider a lower calorie diet and regular exercise.   Take Aleve 200 mg twice daily for pain or swelling  Return in one month for follow-up

## 2011-05-16 NOTE — Progress Notes (Signed)
  Subjective:    Patient ID: Lauren Campos, female    DOB: [birth year], 55 y.o.   MRN: [id number]  HPI  55 year old patient who is seen today for followup. Chronic problems include obesity constipation insomnia and low back pain. For the past 2 or 3 months she has had increasing bilateral leg pain. She describes this as a aching sensation much like a toothache. She states that some days she cannot work a full day of work due to the discomfort. She did have a lumbar MRI in 2003 it revealed some facet arthropathy but no spinal stenosis. She had low back pain and left leg pain at that time. She continues to smoke one half pack of cigarettes daily and does have a history of mild dyslipidemia. Denies any claudication type symptoms.    Review of Systems  Constitutional: Positive for unexpected weight change.  HENT: Negative for hearing loss, congestion, sore throat, rhinorrhea, dental problem, sinus pressure and tinnitus.   Eyes: Negative for pain, discharge and visual disturbance.  Respiratory: Negative for cough and shortness of breath.   Cardiovascular: Negative for chest pain, palpitations and leg swelling.  Gastrointestinal: Positive for constipation. Negative for nausea, vomiting, abdominal pain, diarrhea, blood in stool and abdominal distention.  Genitourinary: Negative for dysuria, urgency, frequency, hematuria, flank pain, vaginal bleeding, vaginal discharge, difficulty urinating, vaginal pain and pelvic pain.  Musculoskeletal: Positive for myalgias and back pain. Negative for joint swelling, arthralgias and gait problem.  Skin: Negative for rash.  Neurological: Negative for dizziness, syncope, speech difficulty, weakness, numbness and headaches.  Hematological: Negative for adenopathy.  Psychiatric/Behavioral: Positive for sleep disturbance. Negative for behavioral problems, dysphoric mood and agitation. The patient is not nervous/anxious.        Objective:   Physical Exam    Constitutional: She is oriented to person, place, and time. She appears well-developed and well-nourished.  HENT:  Head: Normocephalic.  Right Ear: External ear normal.  Left Ear: External ear normal.  Mouth/Throat: Oropharynx is clear and moist.  Eyes: Conjunctivae and EOM are normal. Pupils are equal, round, and reactive to light.  Neck: Normal range of motion. Neck supple. No thyromegaly present.  Cardiovascular: Normal rate, regular rhythm, normal heart sounds and intact distal pulses.        Pedal pulses full  Pulmonary/Chest: Effort normal and breath sounds normal.  Abdominal: Soft. Bowel sounds are normal. She exhibits no mass. There is no tenderness.  Musculoskeletal: Normal range of motion.       Negative straight leg test  Lymphadenopathy:    She has no cervical adenopathy.  Neurological: She is alert and oriented to person, place, and time.  Skin: Skin is warm and dry. No rash noted.  Psychiatric: She has a normal mood and affect. Her behavior is normal.          Assessment & Plan:   Leg pain unclear etiology. Patient has no health insurance but may after the first of the year we'll recheck in one month and consider lab update and possibly radiological evaluation if unimproved. We'll treat symptomatically. Weight loss encouraged. Chronic low back pain Mild dyslipidemia Tobacco abuse

## 2011-05-31 ENCOUNTER — Other Ambulatory Visit: Payer: Self-pay

## 2011-05-31 MED ORDER — PHENTERMINE HCL 37.5 MG PO CAPS: 37.5000 mg | ORAL_CAPSULE | ORAL | Status: DC

## 2011-05-31 NOTE — Telephone Encounter (Signed)
Addended by: Duard Brady I on: 05/31/2011 05:03 PM   Modules accepted: Orders

## 2011-05-31 NOTE — Telephone Encounter (Signed)
OK # [phone] 

## 2011-05-31 NOTE — Telephone Encounter (Signed)
[facility] request [facility] for phenteramine 37.5 Please advise - I dont see on current or past med list  Last seen 04/2011

## 2011-06-03 ENCOUNTER — Telehealth: Payer: Self-pay | Admitting: Internal Medicine

## 2011-06-03 NOTE — Telephone Encounter (Signed)
lmom for pt to call back

## 2011-06-03 NOTE — Telephone Encounter (Signed)
Pt was seen on 05-16-11. Pt did not have labs done due to no Ins. Pt has health ins now, what labs to sch?

## 2011-06-03 NOTE — Telephone Encounter (Signed)
[facility] labs  

## 2011-06-06 NOTE — Telephone Encounter (Signed)
Pt returned call and has been sch for [facility] labs as noted.

## 2011-06-07 ENCOUNTER — Other Ambulatory Visit (INDEPENDENT_AMBULATORY_CARE_PROVIDER_SITE_OTHER): Payer: Self-pay

## 2011-06-07 DIAGNOSIS — Z0000 Encounter for general adult medical examination without abnormal findings: Secondary | ICD-10-CM

## 2011-06-07 DIAGNOSIS — Z23 Encounter for immunization: Secondary | ICD-10-CM

## 2011-06-07 LAB — CBC WITH DIFFERENTIAL/PLATELET
Basophils Absolute: 0 10*3/uL (ref 0.0–0.1)
Lymphocytes Relative: 41.8 % (ref 12.0–46.0)
Monocytes Relative: 7.1 % (ref 3.0–12.0)
Platelets: 296 10*3/uL (ref 150.0–400.0)
RDW: 15.3 % — ABNORMAL HIGH (ref 11.5–14.6)

## 2011-06-07 LAB — BASIC METABOLIC PANEL
BUN: 10 mg/dL (ref 6–23)
Calcium: 9.5 mg/dL (ref 8.4–10.5)
GFR: 130.66 mL/min (ref >60.00)
Glucose, Bld: 107 mg/dL — ABNORMAL HIGH (ref 70–99)

## 2011-06-07 LAB — POCT URINALYSIS DIPSTICK
Blood, UA: NEGATIVE
Glucose, UA: NEGATIVE
Nitrite, UA: NEGATIVE
Protein, UA: NEGATIVE
Urobilinogen, UA: 0.2

## 2011-06-07 LAB — LIPID PANEL
Cholesterol: 214 mg/dL — ABNORMAL HIGH (ref 0–200)
Total CHOL/HDL Ratio: 3
VLDL: 12.6 mg/dL (ref 0.0–40.0)

## 2011-06-07 LAB — HEPATIC FUNCTION PANEL
AST: 22 U/L (ref 0–37)
Alkaline Phosphatase: 74 U/L (ref 39–117)
Total Bilirubin: 0.6 mg/dL (ref 0.3–1.2)

## 2011-06-10 ENCOUNTER — Telehealth: Payer: Self-pay | Admitting: *Deleted

## 2011-06-10 NOTE — Telephone Encounter (Signed)
Unable to write note  with out office visit for documentation. Patient may need venous Doppler to exclude DVT

## 2011-06-10 NOTE — Telephone Encounter (Signed)
Please call and infom and offer appt.

## 2011-06-10 NOTE — Telephone Encounter (Signed)
Pt. Cannot come in today.

## 2011-06-10 NOTE — Telephone Encounter (Signed)
Please advise 

## 2011-06-10 NOTE — Telephone Encounter (Signed)
Needs out of work note for today with edema of leg.  Please fax to [phone].

## 2011-06-20 ENCOUNTER — Other Ambulatory Visit: Payer: Self-pay | Admitting: Internal Medicine

## 2011-06-20 MED ORDER — CLONAZEPAM 0.5 MG PO TABS: 0.5000 mg | ORAL_TABLET | Freq: Two times a day (BID) | ORAL | Status: DC

## 2011-06-20 NOTE — Telephone Encounter (Signed)
Rx called in to pharmacy. 

## 2011-06-20 NOTE — Telephone Encounter (Signed)
Pt last seen 06/15/10.  Rx last filled 06/15/10 #60. Pls advise.

## 2011-06-20 NOTE — Telephone Encounter (Signed)
Patient was seen on 05/16/2011 not 06/15/2010;  okay for refill #60  RF 2

## 2011-06-20 NOTE — Telephone Encounter (Signed)
Pt need refill on clonazaepam [facility]. Pt also need lab results.

## 2011-06-21 ENCOUNTER — Telehealth: Payer: Self-pay | Admitting: Internal Medicine

## 2011-06-21 NOTE — Telephone Encounter (Signed)
Please call/notify patient that lab/test/procedure is normal 

## 2011-06-21 NOTE — Telephone Encounter (Signed)
Pt aware.

## 2011-06-21 NOTE — Telephone Encounter (Signed)
Pt is calling back again for lab results.

## 2011-06-21 NOTE — Telephone Encounter (Signed)
Pt would like blood work results °

## 2011-06-30 ENCOUNTER — Encounter: Payer: Self-pay | Admitting: Family Medicine

## 2011-06-30 ENCOUNTER — Ambulatory Visit (INDEPENDENT_AMBULATORY_CARE_PROVIDER_SITE_OTHER): Payer: Self-pay | Admitting: Family Medicine

## 2011-06-30 DIAGNOSIS — M545 Low back pain: Secondary | ICD-10-CM

## 2011-06-30 DIAGNOSIS — E669 Obesity, unspecified: Secondary | ICD-10-CM

## 2011-06-30 DIAGNOSIS — M25559 Pain in unspecified hip: Secondary | ICD-10-CM

## 2011-06-30 NOTE — Progress Notes (Signed)
  Subjective:    Patient ID: Lauren Campos, female    DOB: 1955-09-10, 56 y.o.   MRN: [id number]  HPI  Acute visit. She's had some chronic low back pain with bilateral radiculopathy symptoms over the past 4-5 months and possibly longer. She reported fall at work 2 weeks ago but has had pain well before then going back to last fall. Pain is 7/10 in intensity. Occasionally achy and occasionally sharp. Somewhat poorly localized. Lower lumbar area and radiates toward buttocks and thighs bilaterally. Worse with walking. Occasional numbness involving feet and lower legs. No clear weakness. No urine incontinence. Tramadol helps. Also has some poorly localized hip pain.  Patient had MRI scan lumbar spine 2003 with degenerative spondylosis but no acute findings. Denies any appetite or weight changes. Long history of nicotine use. No history of malignancy. Several recent labs done and these were reviewed and basically unremarkable  Past Medical History  Diagnosis Date  .   07/20/2006  . BACK PAIN, LOW 07/20/2006  . BREAST LUMP 09/05/2007  . CONSTIPATION, CHRONIC 08/10/2009  . DIVERTICULOSIS, COLON 08/10/2009  . Hemoptysis 08/27/2008  . HYPERLIPIDEMIA 05/08/2007  . HYPERLIPIDEMIA 07/20/2006  . INSOMNIA NOS 07/20/2006  . 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 MIGRAINE, COMMON 05/08/2007  . OBESITY, NOS 07/20/2006   Past Surgical History  Procedure Date  . Tubal ligation   . Abdominal hysterectomy   . Laparoscopic gastric banding     reports that she has been smoking Cigarettes.  She has been smoking about .5 packs per day. She has never used smokeless tobacco. She reports that she does not drink alcohol or use illicit drugs. family history includes Cancer in her father and mother; Diabetes in her mother and sister; and Heart disease in her mother. No Known Allergies    Review of Systems  Constitutional: Negative for fever, chills, activity change, appetite change and unexpected weight change.  Respiratory:  Negative for cough and shortness of breath.   Cardiovascular: Negative for chest pain.  Gastrointestinal: Negative for abdominal pain.  Genitourinary: Negative for dysuria.  Musculoskeletal: Positive for back pain. Negative for myalgias and joint swelling.  Skin: Negative for rash.  Neurological: Positive for numbness. Negative for dizziness.  Hematological: Negative for adenopathy.       Objective:   Physical Exam  Constitutional: She appears well-developed and well-nourished.  Cardiovascular: Normal rate and regular rhythm.   Pulmonary/Chest: Effort normal and breath sounds normal. No respiratory distress. She has no wheezes. She has no rales.  Abdominal: Soft. There is no tenderness.  Musculoskeletal: She exhibits no edema.       Straight leg raises are negative. No evidence for peripheral edema. She has somewhat limited range of motion of both hips. No lateral hip tenderness  Neurological:       Deep tendon reflexes 2+ knee and ankle bilaterally. Full-strength with plantar flexion, dorsiflexion, and knee extension bilaterally. No major sensory impairment to touch          Assessment & Plan:  Lumbar and bilateral hip pain with some radiculopathy features. Rule out lumbar stenosis. Rule out significant osteoarthritis hips. Start with plain films lumbosacral spine and hip. Refill tramadol. May need MRI lumbosacral spine to further assess

## 2011-07-01 ENCOUNTER — Ambulatory Visit (INDEPENDENT_AMBULATORY_CARE_PROVIDER_SITE_OTHER)
Admission: RE | Admit: 2011-07-01 | Discharge: 2011-07-01 | Disposition: A | Discharge status: Home / Self Care | Payer: Self-pay | Source: Ambulatory Visit | Attending: Family Medicine | Admitting: Family Medicine

## 2011-07-01 DIAGNOSIS — M545 Low back pain: Secondary | ICD-10-CM

## 2011-07-01 DIAGNOSIS — M25559 Pain in unspecified hip: Secondary | ICD-10-CM

## 2011-07-11 ENCOUNTER — Ambulatory Visit: Payer: Self-pay | Admitting: Internal Medicine

## 2011-07-26 ENCOUNTER — Other Ambulatory Visit: Payer: Self-pay | Admitting: Internal Medicine

## 2011-07-26 ENCOUNTER — Encounter (INDEPENDENT_AMBULATORY_CARE_PROVIDER_SITE_OTHER): Payer: Self-pay | Admitting: General Surgery

## 2011-07-26 ENCOUNTER — Telehealth: Payer: Self-pay | Admitting: Internal Medicine

## 2011-07-26 DIAGNOSIS — Z1231 Encounter for screening mammogram for malignant neoplasm of breast: Secondary | ICD-10-CM

## 2011-07-26 NOTE — Telephone Encounter (Signed)
Pt came in to see Dr Caryl Never on 06/30/11 re: leg pain. Pt said that she still has leg pain and now the pain is in her arms also. Pt is req to get a doctors note from pcp, that says that pt can not work 12 hr shifts on Fridays, just 8 hrs only on Fridays. Pt will call back with fax # to send letter.

## 2011-07-26 NOTE — Telephone Encounter (Signed)
Ok to send letter. thanks

## 2011-07-26 NOTE — Telephone Encounter (Signed)
Pt is calling back with fax #[phone] attn sherry

## 2011-07-26 NOTE — Telephone Encounter (Signed)
Please advise 

## 2011-07-27 ENCOUNTER — Encounter (INDEPENDENT_AMBULATORY_CARE_PROVIDER_SITE_OTHER): Payer: Self-pay | Admitting: Surgery

## 2011-07-27 NOTE — Telephone Encounter (Signed)
Spoke with pt - leg pain bad today - appt made for tomorrow 8AM - will take care of letter at that time. KIK

## 2011-07-27 NOTE — Telephone Encounter (Signed)
Attempt to call- VM - LMTCB , need to know if there is anything different that needs to go in letter other than due to continued leg pain ok to reduce shifts from 12hrs to 8hrs.  Will send letter out tomorrow AM if I dont hear from her. Today is half day.

## 2011-07-28 ENCOUNTER — Encounter: Payer: Self-pay | Admitting: Internal Medicine

## 2011-07-28 ENCOUNTER — Ambulatory Visit (INDEPENDENT_AMBULATORY_CARE_PROVIDER_SITE_OTHER): Payer: Self-pay | Admitting: Internal Medicine

## 2011-07-28 ENCOUNTER — Ambulatory Visit
Admission: RE | Admit: 2011-07-28 | Discharge: 2011-07-28 | Disposition: A | Discharge status: Home / Self Care | Payer: 59 | Source: Ambulatory Visit | Attending: Internal Medicine | Admitting: Internal Medicine

## 2011-07-28 DIAGNOSIS — F17200 Nicotine dependence, unspecified, uncomplicated: Secondary | ICD-10-CM

## 2011-07-28 DIAGNOSIS — E782 Mixed hyperlipidemia: Secondary | ICD-10-CM

## 2011-07-28 DIAGNOSIS — M545 Low back pain: Secondary | ICD-10-CM

## 2011-07-28 NOTE — Progress Notes (Signed)
  Subjective:    Patient ID: Lauren Campos, female    DOB: [birth year], 56 y.o.   MRN: [id number]  HPI  56 year old patient who is seen today for followup. She has a history of dyslipidemia as well as ongoing tobacco use. Her chief complaint today is persistent low back pain and especially bilateral leg pain. She complains of a constant achiness of the legs that interferes with work. She states the pain is worsened by prolonged standing. She is requesting a letter to excuse from work due to the pain. She did have a lumbar MRI in 2003. Recent radiographs the lumbar spine and hip areas revealed the age-related degenerative changes only. Denies any bowel or bladder dysfunction. Pain is somewhat bothersome during the night but much less severe. Pain is aggravated by walking and prolonged standing.   Review of Systems  Constitutional: Negative.   HENT: Negative for hearing loss, congestion, sore throat, rhinorrhea, dental problem, sinus pressure and tinnitus.   Eyes: Negative for pain, discharge and visual disturbance.  Respiratory: Negative for cough and shortness of breath.   Cardiovascular: Negative for chest pain, palpitations and leg swelling.  Gastrointestinal: Negative for nausea, vomiting, abdominal pain, diarrhea, constipation, blood in stool and abdominal distention.  Genitourinary: Negative for dysuria, urgency, frequency, hematuria, flank pain, vaginal bleeding, vaginal discharge, difficulty urinating, vaginal pain and pelvic pain.  Musculoskeletal: Positive for back pain. Negative for joint swelling, arthralgias (bilateral leg pain) and gait problem.  Skin: Negative for rash.  Neurological: Negative for dizziness, syncope, speech difficulty, weakness, numbness and headaches.  Hematological: Negative for adenopathy.  Psychiatric/Behavioral: Negative for behavioral problems, dysphoric mood and agitation. The patient is not nervous/anxious.        Objective:   Physical Exam    Constitutional: She is oriented to person, place, and time. She appears well-developed and well-nourished.  HENT:  Head: Normocephalic.  Right Ear: External ear normal.  Left Ear: External ear normal.  Mouth/Throat: Oropharynx is clear and moist.  Eyes: Conjunctivae and EOM are normal. Pupils are equal, round, and reactive to light.  Neck: Normal range of motion. Neck supple. No thyromegaly present.  Cardiovascular: Normal rate, regular rhythm, normal heart sounds and intact distal pulses.   Pulmonary/Chest: Effort normal and breath sounds normal.  Abdominal: Soft. Bowel sounds are normal. She exhibits no mass. There is no tenderness.  Musculoskeletal: Normal range of motion.       Straight leg testing normal  Lymphadenopathy:    She has no cervical adenopathy.  Neurological: She is alert and oriented to person, place, and time.       Reflexes intact No motor weakness Ankle flexion and extension normal  Skin: Skin is warm and dry. No rash noted.  Psychiatric: She has a normal mood and affect. Her behavior is normal.          Assessment & Plan:   Chronic low back pain and leg pain. We'll obtain a lumbar MRI to rule out spinal stenosis Dyslipidemia stable Tobacco abuse. Smoking cessation encouraged   A letter on her behalf was written to excuse from work until diagnostic studies are complete

## 2011-07-28 NOTE — Patient Instructions (Signed)
Smoking tobacco is very bad for your health. You should stop smoking immediately.    It is important that you exercise regularly, at least 20 minutes 3 to 4 times per week.  If you develop chest pain or shortness of breath seek  medical attention.  Return in 3 months for follow-up  

## 2011-08-03 ENCOUNTER — Telehealth: Payer: Self-pay | Admitting: Internal Medicine

## 2011-08-03 NOTE — Telephone Encounter (Signed)
Patient called stating that she is having sharp pains in her breasts and the [facility] told her she can get an earlier appt with a referral from her PCP. Please assist.

## 2011-08-04 ENCOUNTER — Other Ambulatory Visit: Payer: Self-pay | Admitting: Internal Medicine

## 2011-08-04 NOTE — Telephone Encounter (Signed)
Order faxed to breast senter - pt instructed to call to schedule appt. KIK

## 2011-08-04 NOTE — Telephone Encounter (Signed)
ok 

## 2011-08-04 NOTE — Telephone Encounter (Signed)
Please advise 

## 2011-08-10 ENCOUNTER — Telehealth: Payer: Self-pay | Admitting: Internal Medicine

## 2011-08-10 DIAGNOSIS — N644 Mastodynia: Secondary | ICD-10-CM

## 2011-08-10 NOTE — Telephone Encounter (Signed)
Addended by: Earle Gell C on: 08/10/2011 12:00 PM   Modules accepted: Orders

## 2011-08-10 NOTE — Telephone Encounter (Signed)
The breast center called stating they need to know which side the patient is having pain on they stated you can send the same order, just specify which side so they can perform the correct testing. Please assist.

## 2011-08-10 NOTE — Telephone Encounter (Signed)
Addended by: Azucena Freed on: 08/10/2011 11:58 AM   Modules accepted: Orders

## 2011-08-10 NOTE — Telephone Encounter (Signed)
Attempt to call pt- VM - LMTCB , need to know which breast she is having pain with

## 2011-08-10 NOTE — Telephone Encounter (Signed)
Pt called back and states she is having pain in both breast.  Pt denies any discharge.

## 2011-08-10 NOTE — Telephone Encounter (Signed)
New order sent to [facility] Center.

## 2011-08-17 ENCOUNTER — Ambulatory Visit
Admission: RE | Admit: 2011-08-17 | Discharge: 2011-08-17 | Disposition: A | Discharge status: Home / Self Care | Payer: 59 | Source: Ambulatory Visit | Attending: Internal Medicine | Admitting: Internal Medicine

## 2011-08-17 ENCOUNTER — Ambulatory Visit: Admission: RE | Admit: 2011-08-17 | Payer: 59 | Source: Ambulatory Visit

## 2011-08-17 ENCOUNTER — Other Ambulatory Visit: Payer: Self-pay | Admitting: Internal Medicine

## 2011-08-17 DIAGNOSIS — N644 Mastodynia: Secondary | ICD-10-CM

## 2011-09-12 ENCOUNTER — Ambulatory Visit: Payer: Self-pay

## 2011-09-21 ENCOUNTER — Telehealth (INDEPENDENT_AMBULATORY_CARE_PROVIDER_SITE_OTHER): Payer: Self-pay | Admitting: General Surgery

## 2011-09-21 NOTE — Telephone Encounter (Signed)
Called patient work # to advise of appointment change, was told she is no longer an employee there. Called patient home/cell of [phone] and did not receive a response or vmail message prompt. No message left. Will try again later to advise the patient the appointment was changed to 11/18/11 at 4:40. Dr. Daphine Deutscher on vacation the week prior, thus appointment changed.

## 2011-09-22 ENCOUNTER — Encounter: Payer: Self-pay | Admitting: Internal Medicine

## 2011-09-22 ENCOUNTER — Ambulatory Visit (INDEPENDENT_AMBULATORY_CARE_PROVIDER_SITE_OTHER): Payer: Self-pay | Admitting: Internal Medicine

## 2011-09-22 VITALS — BP 110/70 | Temp 98.1°F | Wt 208.0 lb

## 2011-09-22 DIAGNOSIS — G43009 Migraine without aura, not intractable, without status migrainosus: Secondary | ICD-10-CM

## 2011-09-22 MED ORDER — AMITRIPTYLINE HCL 25 MG PO TABS: 25.0000 mg | ORAL_TABLET | Freq: Every day | ORAL | Status: DC

## 2011-09-22 MED ORDER — PREDNISONE 20 MG PO TABS: 20.0000 mg | ORAL_TABLET | Freq: Every day | ORAL | Status: AC

## 2011-09-22 NOTE — Progress Notes (Signed)
  Subjective:    Patient ID: Lauren Campos, female    DOB: 1956/01/17, 56 y.o.   MRN: [id number]  HPI 56 year old patient who has a history of Common migraine headaches. For the past 3 weeks she has had chronic daily headaches. Headaches are diffuse and qualitatively similar to her migraines in the past. She has been using BC powder with benefit but not total resolution. She states she often awakes in the morning with headaches. One day a week ago she had some temporary numbness involving her left facial area that has not reoccurred     Review of Systems  Constitutional: Negative.   HENT: Negative for hearing loss, congestion, sore throat, rhinorrhea, dental problem, sinus pressure and tinnitus.   Eyes: Negative for pain, discharge and visual disturbance.  Respiratory: Negative for cough and shortness of breath.   Cardiovascular: Negative for chest pain, palpitations and leg swelling.  Gastrointestinal: Negative for nausea, vomiting, abdominal pain, diarrhea, constipation, blood in stool and abdominal distention.  Genitourinary: Negative for dysuria, urgency, frequency, hematuria, flank pain, vaginal bleeding, vaginal discharge, difficulty urinating, vaginal pain and pelvic pain.  Musculoskeletal: Negative for joint swelling, arthralgias and gait problem.  Skin: Negative for rash.  Neurological: Positive for headaches. Negative for dizziness, syncope, speech difficulty, weakness and numbness.  Hematological: Negative for adenopathy.  Psychiatric/Behavioral: Negative for behavioral problems, dysphoric mood and agitation. The patient is not nervous/anxious.        Objective:   Physical Exam  Constitutional: She is oriented to person, place, and time. She appears well-developed and well-nourished. No distress.       Appears in no distress. Blood pressure 120/80  HENT:  Head: Normocephalic.  Right Ear: External ear normal.  Left Ear: External ear normal.  Mouth/Throat: Oropharynx is  clear and moist.  Eyes: Conjunctivae and EOM are normal. Pupils are equal, round, and reactive to light.  Neck: Normal range of motion. Neck supple. No thyromegaly present.  Cardiovascular: Normal rate, regular rhythm, normal heart sounds and intact distal pulses.   Pulmonary/Chest: Effort normal and breath sounds normal.  Abdominal: Soft. Bowel sounds are normal. She exhibits no mass. There is no tenderness.  Musculoskeletal: Normal range of motion.  Lymphadenopathy:    She has no cervical adenopathy.  Neurological: She is alert and oriented to person, place, and time. She has normal reflexes. No cranial nerve deficit. Coordination normal.  Skin: Skin is warm and dry. No rash noted.  Psychiatric: She has a normal mood and affect. Her behavior is normal.          Assessment & Plan:    Chronic daily headaches for 3 weeks duration. We'll treat with prednisone 20 mg daily for 5 days. We'll give samples of Frova 2.5 mg and clinically observed. We'll call if unimproved

## 2011-09-22 NOTE — Patient Instructions (Signed)
frova 2.5 take as needed for migraine headache may repeat in 4 hours but did not take more than 2 tablets in a 24-hour period  Try Excedrin Migraine  Prednisone as directed  Call or return to clinic prn if these symptoms worsen or fail to improve as anticipated.

## 2011-10-27 ENCOUNTER — Other Ambulatory Visit: Payer: Self-pay

## 2011-10-27 MED ORDER — CLONAZEPAM 0.5 MG PO TABS: 0.5000 mg | ORAL_TABLET | Freq: Two times a day (BID) | ORAL | Status: DC

## 2011-11-10 ENCOUNTER — Encounter (INDEPENDENT_AMBULATORY_CARE_PROVIDER_SITE_OTHER): Payer: 59

## 2011-11-11 ENCOUNTER — Encounter (INDEPENDENT_AMBULATORY_CARE_PROVIDER_SITE_OTHER): Payer: 59 | Admitting: Surgery

## 2011-11-18 ENCOUNTER — Encounter (INDEPENDENT_AMBULATORY_CARE_PROVIDER_SITE_OTHER): Payer: 59 | Admitting: Surgery

## 2011-11-21 ENCOUNTER — Ambulatory Visit (INDEPENDENT_AMBULATORY_CARE_PROVIDER_SITE_OTHER): Payer: Self-pay | Admitting: Internal Medicine

## 2011-11-21 ENCOUNTER — Encounter: Payer: Self-pay | Admitting: Internal Medicine

## 2011-11-21 VITALS — BP 128/70 | HR 82 | Temp 98.5°F | Wt 214.0 lb

## 2011-11-21 DIAGNOSIS — L259 Unspecified contact dermatitis, unspecified cause: Secondary | ICD-10-CM

## 2011-11-21 DIAGNOSIS — M25512 Pain in left shoulder: Secondary | ICD-10-CM

## 2011-11-21 DIAGNOSIS — Z598 Other problems related to housing and economic circumstances: Secondary | ICD-10-CM

## 2011-11-21 DIAGNOSIS — M25519 Pain in unspecified shoulder: Secondary | ICD-10-CM

## 2011-11-21 MED ORDER — PREDNISONE 20 MG PO TABS: ORAL_TABLET | ORAL | Status: DC

## 2011-11-21 NOTE — Patient Instructions (Addendum)
This rash  Acts like  Contact dermatitis.    Take the prednisone taper.   For this   May also help your shoulder   Consider  Other therapy x ray if needed for the shoulder . If  persistent or progressive     Shoulder Exercises EXERCISES  RANGE OF MOTION (ROM) AND STRETCHING EXERCISES These exercises may help you when beginning to rehabilitate your injury. Your symptoms may resolve with or without further involvement from your physician, physical therapist or athletic trainer. While completing these exercises, remember:   Restoring tissue flexibility helps normal motion to return to the joints. This allows healthier, less painful movement and activity.   An effective stretch should be held for at least 30 seconds.   A stretch should never be painful. You should only feel a gentle lengthening or release in the stretched tissue.  ROM - Pendulum  Bend at the waist so that your right / left arm falls away from your body. Support yourself with your opposite hand on a solid surface, such as a table or a countertop.   Your right / left arm should be perpendicular to the ground. If it is not perpendicular, you need to lean over farther. Relax the muscles in your right / left arm and shoulder as much as possible.   Gently sway your hips and trunk so they move your right / left arm without any use of your right / left shoulder muscles.   Progress your movements so that your right / left arm moves side to side, then forward and backward, and finally, both clockwise and counterclockwise.   Complete __________ repetitions in each direction. Many people use this exercise to relieve discomfort in their shoulder as well as to gain range of motion.  Repeat __________ times. Complete this exercise __________ times per day. STRETCH - Flexion, Standing  Stand with good posture. With an underhand grip on your right / left hand and an overhand grip on the opposite hand, grasp a broomstick or cane so that  your hands are a little more than shoulder-width apart.   Keeping your right / left elbow straight and shoulder muscles relaxed, push the stick with your opposite hand to raise your right / left arm in front of your body and then overhead. Raise your arm until you feel a stretch in your right / left shoulder, but before you have increased shoulder pain.   Try to avoid shrugging your right / left shoulder as your arm rises by keeping your shoulder blade tucked down and toward your mid-back spine. Hold __________ seconds.   Slowly return to the starting position.  Repeat __________ times. Complete this exercise __________ times per day. STRETCH - Internal Rotation  Place your right / left hand behind your back, palm-up.   Throw a towel or belt over your opposite shoulder. Grasp the towel/belt with your right / left hand.   While keeping an upright posture, gently pull up on the towel/belt until you feel a stretch in the front of your right / left shoulder.   Avoid shrugging your right / left shoulder as your arm rises by keeping your shoulder blade tucked down and toward your mid-back spine.   Hold __________. Release the stretch by lowering your opposite hand.  Repeat __________ times. Complete this exercise __________ times per day. STRETCH - External Rotation and Abduction  Stagger your stance through a doorframe. It does not matter which foot is forward.   As instructed by  your physician, physical therapist or athletic trainer, place your hands:   And forearms above your head and on the door frame.   And forearms at head-height and on the door frame.   At elbow-height and on the door frame.   Keeping your head and chest upright and your stomach muscles tight to prevent over-extending your low-back, slowly shift your weight onto your front foot until you feel a stretch across your chest and/or in the front of your shoulders.   Hold __________ seconds. Shift your weight to your back  foot to release the stretch.  Repeat __________ times. Complete this stretch __________ times per day.  STRENGTHENING EXERCISES  These exercises may help you when beginning to rehabilitate your injury. They may resolve your symptoms with or without further involvement from your physician, physical therapist or athletic trainer. While completing these exercises, remember:   Muscles can gain both the endurance and the strength needed for everyday activities through controlled exercises.   Complete these exercises as instructed by your physician, physical therapist or athletic trainer. Progress the resistance and repetitions only as guided.   You may experience muscle soreness or fatigue, but the pain or discomfort you are trying to eliminate should never worsen during these exercises. If this pain does worsen, stop and make certain you are following the directions exactly. If the pain is still present after adjustments, discontinue the exercise until you can discuss the trouble with your clinician.   If advised by your physician, during your recovery, avoid activity or exercises which involve actions that place your right / left hand or elbow above your head or behind your back or head. These positions stress the tissues which are trying to heal.  STRENGTH - Scapular Depression and Adduction  With good posture, sit on a firm chair. Supported your arms in front of you with pillows, arm rests or a table top. Have your elbows in line with the sides of your body.   Gently draw your shoulder blades down and toward your mid-back spine. Gradually increase the tension without tensing the muscles along the top of your shoulders and the back of your neck.   Hold for __________ seconds. Slowly release the tension and relax your muscles completely before completing the next repetition.   After you have practiced this exercise, remove the arm support and complete it in standing as well as sitting.  Repeat  __________ times. Complete this exercise __________ times per day.  STRENGTH - External Rotators  Secure a rubber exercise band/tubing to a fixed object so that it is at the same height as your right / left elbow when you are standing or sitting on a firm surface.   Stand or sit so that the secured exercise band/tubing is at your side that is not injured.   Bend your elbow 90 degrees. Place a folded towel or small pillow under your right / left arm so that your elbow is a few inches away from your side.   Keeping the tension on the exercise band/tubing, pull it away from your body, as if pivoting on your elbow. Be sure to keep your body steady so that the movement is only coming from your shoulder rotating.   Hold __________ seconds. Release the tension in a controlled manner as you return to the starting position.  Repeat __________ times. Complete this exercise __________ times per day.  STRENGTH - Supraspinatus  Stand or sit with good posture. Grasp a __________ weight or an exercise  band/tubing so that your hand is "thumbs-up," like when you shake hands.   Slowly lift your right / left hand from your thigh into the air, traveling about 30 degrees from straight out at your side. Lift your hand to shoulder height or as far as you can without increasing any shoulder pain. Initially, many people do not lift their hands above shoulder height.   Avoid shrugging your right / left shoulder as your arm rises by keeping your shoulder blade tucked down and toward your mid-back spine.   Hold for __________ seconds. Control the descent of your hand as you slowly return to your starting position.  Repeat __________ times. Complete this exercise __________ times per day.  STRENGTH - Shoulder Extensors  Secure a rubber exercise band/tubing so that it is at the height of your shoulders when you are either standing or sitting on a firm arm-less chair.   With a thumbs-up grip, grasp an end of the  band/tubing in each hand. Straighten your elbows and lift your hands straight in front of you at shoulder height. Step back away from the secured end of band/tubing until it becomes tense.   Squeezing your shoulder blades together, pull your hands down to the sides of your thighs. Do not allow your hands to go behind you.   Hold for __________ seconds. Slowly ease the tension on the band/tubing as you reverse the directions and return to the starting position.  Repeat __________ times. Complete this exercise __________ times per day.  STRENGTH - Scapular Retractors  Secure a rubber exercise band/tubing so that it is at the height of your shoulders when you are either standing or sitting on a firm arm-less chair.   With a palm-down grip, grasp an end of the band/tubing in each hand. Straighten your elbows and lift your hands straight in front of you at shoulder height. Step back away from the secured end of band/tubing until it becomes tense.   Squeezing your shoulder blades together, draw your elbows back as you bend them. Keep your upper arm lifted away from your body throughout the exercise.   Hold __________ seconds. Slowly ease the tension on the band/tubing as you reverse the directions and return to the starting position.  Repeat __________ times. Complete this exercise __________ times per day. STRENGTH - Scapular Depressors  Find a sturdy chair without wheels, such as a from a dining room table.   Keeping your feet on the floor, lift your bottom from the seat and lock your elbows.   Keeping your elbows straight, allow gravity to pull your body weight down. Your shoulders will rise toward your ears.   Raise your body against gravity by drawing your shoulder blades down your back, shortening the distance between your shoulders and ears. Although your feet should always maintain contact with the floor, your feet should progressively support less body weight as you get stronger.   Hold  __________ seconds. In a controlled and slow manner, lower your body weight to begin the next repetition.  Repeat __________ times. Complete this exercise __________ times per day.  Document Released: 03/23/2005 Document Revised: 04/28/2011 Document Reviewed: 08/21/2008 [facility] Patient Information 2012 [place], [place].Contact Dermatitis Contact dermatitis is a reaction to certain substances that touch the skin. Contact dermatitis can be either irritant contact dermatitis or allergic contact dermatitis. Irritant contact dermatitis does not require previous exposure to the substance for a reaction to occur.Allergic contact dermatitis only occurs if you have been exposed to the substance  before. Upon a repeat exposure, your body reacts to the substance.  CAUSES  Many substances can cause contact dermatitis. Irritant dermatitis is most commonly caused by repeated exposure to mildly irritating substances, such as:  Makeup.   Soaps.   Detergents.   Bleaches.   Acids.   Metal salts, such as nickel.  Allergic contact dermatitis is most commonly caused by exposure to:  Poisonous plants.   Chemicals (deodorants, shampoos).   Jewelry.   Latex.   Neomycin in triple antibiotic cream.   Preservatives in products, including clothing.  SYMPTOMS  The area of skin that is exposed may develop:  Dryness or flaking.   Redness.   Cracks.   Itching.   Pain or a burning sensation.   Blisters.  With allergic contact dermatitis, there may also be swelling in areas such as the eyelids, mouth, or genitals.  DIAGNOSIS  Your caregiver can usually tell what the problem is by doing a physical exam. In cases where the cause is uncertain and an allergic contact dermatitis is suspected, a patch skin test may be performed to help determine the cause of your dermatitis. TREATMENT Treatment includes protecting the skin from further contact with the irritating substance by avoiding that substance if  possible. Barrier creams, powders, and gloves may be helpful. Your caregiver may also recommend:  Steroid creams or ointments applied 2 times daily. For best results, soak the rash area in cool water for 20 minutes. Then apply the medicine. Cover the area with a plastic wrap. You can store the steroid cream in the refrigerator for a "chilly" effect on your rash. That may decrease itching. Oral steroid medicines may be needed in more severe cases.   Antibiotics or antibacterial ointments if a skin infection is present.   Antihistamine lotion or an antihistamine taken by mouth to ease itching.   Lubricants to keep moisture in your skin.   Burow's solution to reduce redness and soreness or to dry a weeping rash. Mix one packet or tablet of solution in 2 cups cool water. Dip a clean washcloth in the mixture, wring it out a bit, and put it on the affected area. Leave the cloth in place for 30 minutes. Do this as often as possible throughout the day.   Taking several cornstarch or baking soda baths daily if the area is too large to cover with a washcloth.  Harsh chemicals, such as alkalis or acids, can cause skin damage that is like a burn. You should flush your skin for 15 to 20 minutes with cold water after such an exposure. You should also seek immediate medical care after exposure. Bandages (dressings), antibiotics, and pain medicine may be needed for severely irritated skin.  HOME CARE INSTRUCTIONS  Avoid the substance that caused your reaction.   Keep the area of skin that is affected away from hot water, soap, sunlight, chemicals, acidic substances, or anything else that would irritate your skin.   Do not scratch the rash. Scratching may cause the rash to become infected.   You may take cool baths to help stop the itching.   Only take over-the-counter or prescription medicines as directed by your caregiver.   See your caregiver for follow-up care as directed to make sure your skin is  healing properly.  SEEK MEDICAL CARE IF:   Your condition is not better after 3 days of treatment.   You seem to be getting worse.   You see signs of infection such as swelling, tenderness, redness,  soreness, or warmth in the affected area.   You have any problems related to your medicines.  Document Released: 05/06/2000 Document Revised: 04/28/2011 Document Reviewed: 10/12/2010 [facility] Patient Information 2012 [place], [place].

## 2011-11-24 ENCOUNTER — Encounter: Payer: Self-pay | Admitting: Internal Medicine

## 2011-11-24 DIAGNOSIS — L259 Unspecified contact dermatitis, unspecified cause: Secondary | ICD-10-CM | POA: Insufficient documentation

## 2011-11-24 DIAGNOSIS — M25512 Pain in left shoulder: Secondary | ICD-10-CM | POA: Insufficient documentation

## 2011-11-24 NOTE — Progress Notes (Signed)
  Subjective:    Patient ID: Lauren Campos, female    DOB: [birth year], 56 y.o.   MRN: [id number]  HPI Patient comes in today for SDA for  new problem evaluation. Onset about a week ago of itchy rash began on arm and is spreading. Like a bite from insect.  no exposures  Now on arm s both and ant chest area.  Has a cream from Dr Kirtland Bouchard used in the past  So tried this  NO  Hx of similar rash.  No edema and no hives.  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  No rx. No numbness or weakness  She is right handed.  No falling .  Has no insurance for health going to school [employer].  Review of Systems No fever chills systemic sx cp sob cough weakness  Known plant exposures.  Past history family history social history reviewed in the electronic medical record.    Objective:   Physical Exam BP 128/70  Pulse 82  Temp 98.5 F (36.9 C) (Oral)  Wt 214 lb (97.07 kg)  SpO2 98% wdwn in nad SKIN : discrete papules pink and some excoriated mostly arms hands and trunk  One area on left elbow linear  No vesicles or ulcers or pustules. No interdigital rash.  Left arm tender anterior shoulder  with some LOM  On elevation above 180 degrees.  no weakness or redness  .     Assessment & Plan:  Rash acts like CD poss plant or other.  Will rx for this with steroid systemically 12 day taper.  Left shoulder pain prob tendinitis vs other  With overuse vs injury.   Ice careful exercise when better.   Perhaps the pred may help at this time. Fu if  persistent or progressive

## 2011-12-15 ENCOUNTER — Encounter (INDEPENDENT_AMBULATORY_CARE_PROVIDER_SITE_OTHER): Payer: Self-pay

## 2011-12-19 ENCOUNTER — Encounter: Payer: Self-pay | Admitting: Internal Medicine

## 2011-12-19 ENCOUNTER — Encounter (INDEPENDENT_AMBULATORY_CARE_PROVIDER_SITE_OTHER): Payer: Self-pay | Admitting: Physician Assistant

## 2011-12-19 ENCOUNTER — Ambulatory Visit (INDEPENDENT_AMBULATORY_CARE_PROVIDER_SITE_OTHER): Payer: Self-pay | Admitting: Internal Medicine

## 2011-12-19 VITALS — BP 120/70 | Temp 98.4°F | Wt 215.0 lb

## 2011-12-19 DIAGNOSIS — M25519 Pain in unspecified shoulder: Secondary | ICD-10-CM

## 2011-12-19 DIAGNOSIS — M25512 Pain in left shoulder: Secondary | ICD-10-CM

## 2011-12-19 MED ORDER — PREDNISONE 20 MG PO TABS: ORAL_TABLET | ORAL | Status: DC

## 2011-12-19 NOTE — Progress Notes (Signed)
  Subjective:    Patient ID: Lauren Campos, female    DOB: 28-Nov-1955, 56 y.o.   MRN: [id number]  HPI  56 year old patient who presents with persistent shoulder pain now present at least intermittently for about one month. She was treated earlier for a contact dermatitis with a brief course of prednisone which may have helped temporarily. 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 She is right-handed.  Review of Systems  Musculoskeletal:       Left shoulder pain       Objective:   Physical Exam  Constitutional: She appears well-developed and well-nourished. No distress.  Musculoskeletal:       Fairly full range of motion left shoulder somewhat uncomfortable with internal rotation          Assessment & Plan:   The shoulder pain. Probable tendinitis. Will treat with a prednisone taper over 2 weeks and observe Will call if unimproved

## 2011-12-19 NOTE — Patient Instructions (Signed)
You  may move around, but avoid painful motions and activities.  Apply ice to the sore area for 15 to 20 minutes 3 or 4 times daily for the next two to 3 days.  Prednisone as directed  Call or return to clinic prn if these symptoms worsen or fail to improve as anticipated.

## 2012-01-04 ENCOUNTER — Telehealth: Payer: Self-pay | Admitting: Internal Medicine

## 2012-01-04 NOTE — Telephone Encounter (Signed)
Appt made,  pt aware

## 2012-01-04 NOTE — Telephone Encounter (Signed)
Caller: [name]/Patient; Patient Name: Lauren Campos; PCP: Eleonore Chiquito; Best Callback Phone Number: [phone]     Has had pain in shoulder for several months, was seen in office several times, tried various treatments that do not work.  Has history of pulling patients.  Pain continued so seen in office 1 month ago and was given Prednisone to take for a month or two, dose has been doubled.   Still lot of pain in shoulder and shoulderblade area, sometimes sharp shooting down arm.  Sometimes so severe pain  is unbearable, difficulty picking up arm in morning,  unable to go about usual activities.  Pain worse in the mornings.  Feels she may need Xrays or further evaluation of this shoulder.  Triaged in Shoulder Non-Injury Guideline - Disposition:  See Provider Within 24 Hours due to severe pain with movement limits normal activities.    Appointment not available in EPIC, PLEASE REVIEW AND CONTACT PATIENT AT  [phone].

## 2012-01-04 NOTE — Telephone Encounter (Signed)
Please make he an appt with Lubertha Sayres for tomorrow

## 2012-01-05 ENCOUNTER — Ambulatory Visit: Payer: Self-pay | Admitting: Family

## 2012-01-17 ENCOUNTER — Ambulatory Visit: Payer: Self-pay

## 2012-01-17 ENCOUNTER — Other Ambulatory Visit: Payer: Self-pay | Admitting: Occupational Medicine

## 2012-01-17 DIAGNOSIS — R52 Pain, unspecified: Secondary | ICD-10-CM

## 2012-03-01 ENCOUNTER — Other Ambulatory Visit: Payer: Self-pay | Admitting: Internal Medicine

## 2012-04-03 ENCOUNTER — Encounter: Payer: Self-pay | Admitting: Internal Medicine

## 2012-04-03 ENCOUNTER — Ambulatory Visit (INDEPENDENT_AMBULATORY_CARE_PROVIDER_SITE_OTHER): Payer: Self-pay | Admitting: Internal Medicine

## 2012-04-03 VITALS — BP 110/82 | HR 72 | Temp 98.1°F | Resp 15 | Wt 215.0 lb

## 2012-04-03 DIAGNOSIS — N951 Menopausal and female climacteric states: Secondary | ICD-10-CM

## 2012-04-03 DIAGNOSIS — F17200 Nicotine dependence, unspecified, uncomplicated: Secondary | ICD-10-CM

## 2012-04-03 DIAGNOSIS — Z780 Asymptomatic menopausal state: Secondary | ICD-10-CM

## 2012-04-03 MED ORDER — VENLAFAXINE HCL 37.5 MG PO TABS: 37.5000 mg | ORAL_TABLET | Freq: Two times a day (BID) | ORAL | Status: DC

## 2012-04-03 MED ORDER — GABAPENTIN 300 MG PO CAPS: 300.0000 mg | ORAL_CAPSULE | Freq: Three times a day (TID) | ORAL | Status: DC

## 2012-04-03 MED ORDER — CLONAZEPAM 0.5 MG PO TABS: 0.5000 mg | ORAL_TABLET | Freq: Two times a day (BID) | ORAL | Status: DC

## 2012-04-03 NOTE — Progress Notes (Signed)
Subjective:    Patient ID: Lauren Campos, female    DOB: 19-Sep-1955, 56 y.o.   MRN: [id number]  HPI  56 year old patient who has a history of ongoing tobacco use who presents with a chief complaint of significant vasomotor symptoms. In the past these have been intermittent but for the past 9 months have occurred daily. These are bothersome through the day and especially during the night. She is status post hysterectomy in 2003  Past Medical History  Diagnosis Date  .   07/20/2006  . BACK PAIN, LOW 07/20/2006  . BREAST LUMP 09/05/2007  . CONSTIPATION, CHRONIC 08/10/2009  . DIVERTICULOSIS, COLON 08/10/2009  . Hemoptysis 08/27/2008  . HYPERLIPIDEMIA 05/08/2007  . HYPERLIPIDEMIA 07/20/2006  . INSOMNIA NOS 07/20/2006  . 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 MIGRAINE, COMMON 05/08/2007  . OBESITY, NOS 07/20/2006    History   Social History  . Marital Status: Single    Spouse Name: N/A    Number of Children: N/A  . Years of Education: N/A   Occupational History  . Not on file.   Social History Main Topics  . Smoking status: Current Every Day Smoker -- 0.5 packs/day    Types: Cigarettes  . Smokeless tobacco: Never Used  . Alcohol Use: No  . Drug Use: No  . Sexually Active: Not on file   Other Topics Concern  . Not on file   Social History Narrative  . No narrative on file    Past Surgical History  Procedure Date  . Tubal ligation   . Abdominal hysterectomy   . Laparoscopic gastric banding     Family History  Problem Relation Age of Onset  . Cancer Mother   . Diabetes Mother   . Heart disease Mother   . Cancer Father   . Diabetes Sister     No Known Allergies  Current Outpatient Prescriptions on File Prior to Visit  Medication Sig Dispense Refill  . betamethasone valerate lotion (BETA-VAL) 0.1 % Apply topically daily.  60 mL  1  . traMADol (ULTRAM) 50 MG tablet Take 1 tablet (50 mg total) by mouth every 6 (six) hours as needed for pain. Maximum dose= 8 tablets per day  100  tablet  3  . [DISCONTINUED] clonazePAM (KLONOPIN) 0.5 MG tablet Take 1 tablet (0.5 mg total) by mouth 2 (two) times daily.  60 tablet  2  . gabapentin (NEURONTIN) 300 MG capsule Take 1 capsule (300 mg total) by mouth 3 (three) times daily.  90 capsule  3  . venlafaxine (EFFEXOR) 37.5 MG tablet Take 1 tablet (37.5 mg total) by mouth 2 (two) times daily.  90 tablet  3    BP 110/82  Pulse 72  Temp 98.1 F (36.7 C) (Oral)  Resp 15  Wt 215 lb (97.523 kg)      Review of Systems  Constitutional: Negative.   HENT: Negative for hearing loss, congestion, sore throat, rhinorrhea, dental problem, sinus pressure and tinnitus.   Eyes: Negative for pain, discharge and visual disturbance.  Respiratory: Negative for cough and shortness of breath.   Cardiovascular: Negative for chest pain, palpitations and leg swelling.  Gastrointestinal: Negative for nausea, vomiting, abdominal pain, diarrhea, constipation, blood in stool and abdominal distention.  Genitourinary: Negative for dysuria, urgency, frequency, hematuria, flank pain, vaginal bleeding, vaginal discharge, difficulty urinating, vaginal pain and pelvic pain.  Musculoskeletal: Negative for joint swelling, arthralgias and gait problem.  Skin: Positive for color change. Negative for rash.  Neurological: Negative for dizziness, syncope, speech  difficulty, weakness, numbness and headaches.  Hematological: Negative for adenopathy.  Psychiatric/Behavioral: Negative for behavioral problems, dysphoric mood and agitation. The patient is not nervous/anxious.        Objective:   Physical Exam  Constitutional: She appears well-developed and well-nourished. No distress.  Psychiatric: She has a normal mood and affect. Her behavior is normal. Judgment and thought content normal.          Assessment & Plan:   Menopausal syndrome with vasomotor instability. Options were discussed.  In view of high risk we'll avoid hormone replacement therapy. We'll  give a trial of a bedtime dose of Neurontin and also try Effexor through the day.

## 2012-04-03 NOTE — Patient Instructions (Addendum)
Call or return to clinic prn if these symptoms worsen or fail to improve as anticipated.

## 2012-04-11 ENCOUNTER — Encounter: Payer: Self-pay | Admitting: Family Medicine

## 2012-04-11 ENCOUNTER — Ambulatory Visit (INDEPENDENT_AMBULATORY_CARE_PROVIDER_SITE_OTHER): Payer: Self-pay | Admitting: Family Medicine

## 2012-04-11 VITALS — BP 118/68 | Temp 98.4°F | Wt 219.0 lb

## 2012-04-11 DIAGNOSIS — Z23 Encounter for immunization: Secondary | ICD-10-CM

## 2012-04-11 DIAGNOSIS — R531 Weakness: Secondary | ICD-10-CM

## 2012-04-11 DIAGNOSIS — R109 Unspecified abdominal pain: Secondary | ICD-10-CM

## 2012-04-11 DIAGNOSIS — R5381 Other malaise: Secondary | ICD-10-CM

## 2012-04-11 NOTE — Progress Notes (Signed)
  Subjective:    Patient ID: Lauren Campos, female    DOB: February 04, 1956, 56 y.o.   MRN: [id number]  HPI  Patient seen as a work in with abdominal pain. She gives three-week history of relatively constant pain which is somewhat migratory and diffuse. She describes quality as achy to sharp. Moderate intensity. No fever. No dysuria. No chills. Some early satiety. Decreased appetite but no associated or documented weight loss. She had 2 days of some mild diarrhea followed by constipation but has had relatively normal bowel movements past few days. She's not had any nausea or vomiting. No triggering factors. No alleviating factors. Generally feels weak over the past couple of weeks.  She's had some recent postmenopausal vasomotor symptoms. She did not start gabapentin or Effexor because of cost issues.  Past history significant for lap band surgery about 10 years ago. She has not seen a Careers adviser a couple of years. She apparently owes money and has not been seen for that reason.  Past Medical History  Diagnosis Date  .   07/20/2006  . BACK PAIN, LOW 07/20/2006  . BREAST LUMP 09/05/2007  . CONSTIPATION, CHRONIC 08/10/2009  . DIVERTICULOSIS, COLON 08/10/2009  . Hemoptysis 08/27/2008  . HYPERLIPIDEMIA 05/08/2007  . HYPERLIPIDEMIA 07/20/2006  . INSOMNIA NOS 07/20/2006  . 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 MIGRAINE, COMMON 05/08/2007  . OBESITY, NOS 07/20/2006   Past Surgical History  Procedure Date  . Tubal ligation   . Abdominal hysterectomy   . Laparoscopic gastric banding     reports that she has been smoking Cigarettes.  She has been smoking about .5 packs per day. She has never used smokeless tobacco. She reports that she does not drink alcohol or use illicit drugs. family history includes Cancer in her father and mother; Diabetes in her mother and sister; and Heart disease in her mother. No Known Allergies    Review of Systems  Constitutional: Positive for appetite change and fatigue. Negative for  fever, chills and unexpected weight change.  HENT: Negative for trouble swallowing.   Respiratory: Negative for cough and shortness of breath.   Cardiovascular: Negative for chest pain.  Gastrointestinal: Positive for abdominal pain. Negative for nausea, vomiting, blood in stool and abdominal distention.  Neurological: Negative for dizziness and syncope.       Objective:   Physical Exam  Constitutional: She appears well-developed and well-nourished. No distress.  Cardiovascular: Normal rate and regular rhythm.   Pulmonary/Chest: Effort normal and breath sounds normal. No respiratory distress. She has no wheezes. She has no rales.  Abdominal: Soft. Bowel sounds are normal. She exhibits no distension and no mass. There is no tenderness. There is no rebound and no guarding.       Scars from prior LAP-BAND surgery. Benign exam          Assessment & Plan:  Diffuse abdominal pain. No clinical suspicion for gallstones. Benign exam. Check labs with CBC, hepatic panel, basic metabolic panel. She is encouraged to followup with surgeon regarding her diffuse pain with history of lap band and early satiety but at this point she states she cannot afford to go back to that clinic.

## 2012-04-11 NOTE — Patient Instructions (Addendum)
Follow up immediately for any fever, vomiting, increasing abdominal pain, abdominal distension.

## 2012-04-12 ENCOUNTER — Telehealth: Payer: Self-pay | Admitting: Family Medicine

## 2012-04-12 ENCOUNTER — Telehealth: Payer: Self-pay | Admitting: Internal Medicine

## 2012-04-12 LAB — CBC WITH DIFFERENTIAL/PLATELET
Eosinophils Absolute: 0.2 10*3/uL (ref 0.0–0.7)
Eosinophils Relative: 2.3 % (ref 0.0–5.0)
HCT: 40 % (ref 36.0–46.0)
Lymphs Abs: 2.6 10*3/uL (ref 0.7–4.0)
MCHC: 32.1 g/dL (ref 30.0–36.0)
MCV: 94.6 fl (ref 78.0–100.0)
Monocytes Absolute: 0.5 10*3/uL (ref 0.1–1.0)
Neutrophils Relative %: 59.9 % (ref 43.0–77.0)
Platelets: 337 10*3/uL (ref 150.0–400.0)
WBC: 8.2 10*3/uL (ref 4.5–10.5)

## 2012-04-12 LAB — BASIC METABOLIC PANEL
Chloride: 105 mEq/L (ref 96–112)
GFR: 86.47 mL/min (ref >60.00)
Glucose, Bld: 101 mg/dL — ABNORMAL HIGH (ref 70–99)
Potassium: 4.7 mEq/L (ref 3.5–5.1)
Sodium: 139 mEq/L (ref 135–145)

## 2012-04-12 LAB — HEPATIC FUNCTION PANEL
ALT: 23 U/L (ref 0–35)
Total Bilirubin: 0.3 mg/dL (ref 0.3–1.2)
Total Protein: 7.2 g/dL (ref 6.0–8.3)

## 2012-04-12 NOTE — Telephone Encounter (Signed)
Pt called  And following up about lab results.

## 2012-04-12 NOTE — Telephone Encounter (Signed)
See labs results.  All OK.

## 2012-04-12 NOTE — Telephone Encounter (Signed)
Pt is calling to get the results of her blood work on 04/11/12.  PLEASE FOLLOW UP WIT PATIENT REGARDING LAB RESULTS AND DIRECTIONS FROM MD

## 2012-04-12 NOTE — Progress Notes (Signed)
Quick Note:  Pt informed ______ 

## 2012-04-12 NOTE — Telephone Encounter (Signed)
Pt would like results of lab work done yesterday, 04/11/12.  Pls call her when available. Thanks. [phone]

## 2012-04-13 NOTE — Telephone Encounter (Signed)
Pt called and is req call back from nurse. Pls leave detailed vm if pt is not avail.

## 2012-04-13 NOTE — Telephone Encounter (Signed)
Pt was informed yesterday

## 2012-04-13 NOTE — Telephone Encounter (Signed)
LMOVM

## 2012-04-13 NOTE — Telephone Encounter (Signed)
Left detailed message on voicemail that Dr. Amador Cunas is out of the office till Monday has not reviewed results yet. Will call with results once reviewed.

## 2012-05-23 HISTORY — PX: SHOULDER ARTHROSCOPY W/ ROTATOR CUFF REPAIR: SHX2400

## 2012-06-25 ENCOUNTER — Encounter: Payer: Self-pay | Admitting: Family Medicine

## 2012-06-25 ENCOUNTER — Ambulatory Visit (INDEPENDENT_AMBULATORY_CARE_PROVIDER_SITE_OTHER): Payer: Self-pay | Admitting: Family Medicine

## 2012-06-25 ENCOUNTER — Ambulatory Visit (INDEPENDENT_AMBULATORY_CARE_PROVIDER_SITE_OTHER)
Admission: RE | Admit: 2012-06-25 | Discharge: 2012-06-25 | Disposition: A | Discharge status: Home / Self Care | Payer: Self-pay | Source: Ambulatory Visit | Attending: Family Medicine | Admitting: Family Medicine

## 2012-06-25 ENCOUNTER — Telehealth: Payer: Self-pay | Admitting: *Deleted

## 2012-06-25 ENCOUNTER — Encounter (HOSPITAL_COMMUNITY): Payer: Self-pay | Admitting: *Deleted

## 2012-06-25 ENCOUNTER — Inpatient Hospital Stay (HOSPITAL_COMMUNITY)
Admission: EM | Admit: 2012-06-25 | Discharge: 2012-06-27 | DRG: 603 | Disposition: A | Discharge status: Home / Self Care | Payer: MEDICAID | Attending: Internal Medicine | Admitting: Internal Medicine

## 2012-06-25 VITALS — BP 102/62 | HR 97 | Temp 98.5°F | Ht 60.0 in | Wt 227.0 lb

## 2012-06-25 DIAGNOSIS — Z6841 Body Mass Index (BMI) 40.0 and over, adult: Secondary | ICD-10-CM

## 2012-06-25 DIAGNOSIS — L0390 Cellulitis, unspecified: Secondary | ICD-10-CM | POA: Diagnosis present

## 2012-06-25 DIAGNOSIS — R221 Localized swelling, mass and lump, neck: Secondary | ICD-10-CM

## 2012-06-25 DIAGNOSIS — E782 Mixed hyperlipidemia: Secondary | ICD-10-CM

## 2012-06-25 DIAGNOSIS — R220 Localized swelling, mass and lump, head: Secondary | ICD-10-CM | POA: Diagnosis present

## 2012-06-25 DIAGNOSIS — M545 Low back pain: Secondary | ICD-10-CM

## 2012-06-25 DIAGNOSIS — D490 Neoplasm of unspecified behavior of digestive system: Secondary | ICD-10-CM

## 2012-06-25 DIAGNOSIS — G43909 Migraine, unspecified, not intractable, without status migrainosus: Secondary | ICD-10-CM | POA: Diagnosis present

## 2012-06-25 DIAGNOSIS — G43009 Migraine without aura, not intractable, without status migrainosus: Secondary | ICD-10-CM

## 2012-06-25 DIAGNOSIS — Z598 Other problems related to housing and economic circumstances: Secondary | ICD-10-CM

## 2012-06-25 DIAGNOSIS — K5909 Other constipation: Secondary | ICD-10-CM

## 2012-06-25 DIAGNOSIS — N63 Unspecified lump in unspecified breast: Secondary | ICD-10-CM

## 2012-06-25 DIAGNOSIS — L0211 Cutaneous abscess of neck: Principal | ICD-10-CM | POA: Diagnosis present

## 2012-06-25 DIAGNOSIS — M25512 Pain in left shoulder: Secondary | ICD-10-CM

## 2012-06-25 DIAGNOSIS — K5730 Diverticulosis of large intestine without perforation or abscess without bleeding: Secondary | ICD-10-CM

## 2012-06-25 DIAGNOSIS — L259 Unspecified contact dermatitis, unspecified cause: Secondary | ICD-10-CM

## 2012-06-25 DIAGNOSIS — N951 Menopausal and female climacteric states: Secondary | ICD-10-CM

## 2012-06-25 DIAGNOSIS — F17200 Nicotine dependence, unspecified, uncomplicated: Secondary | ICD-10-CM | POA: Diagnosis present

## 2012-06-25 DIAGNOSIS — L089 Local infection of the skin and subcutaneous tissue, unspecified: Secondary | ICD-10-CM

## 2012-06-25 DIAGNOSIS — E785 Hyperlipidemia, unspecified: Secondary | ICD-10-CM | POA: Diagnosis present

## 2012-06-25 DIAGNOSIS — R911 Solitary pulmonary nodule: Secondary | ICD-10-CM | POA: Diagnosis present

## 2012-06-25 DIAGNOSIS — E669 Obesity, unspecified: Secondary | ICD-10-CM

## 2012-06-25 DIAGNOSIS — G4700 Insomnia, unspecified: Secondary | ICD-10-CM

## 2012-06-25 DIAGNOSIS — B999 Unspecified infectious disease: Secondary | ICD-10-CM

## 2012-06-25 DIAGNOSIS — K5900 Constipation, unspecified: Secondary | ICD-10-CM | POA: Diagnosis present

## 2012-06-25 DIAGNOSIS — F411 Generalized anxiety disorder: Secondary | ICD-10-CM

## 2012-06-25 LAB — COMPREHENSIVE METABOLIC PANEL
ALT: 11 U/L (ref 0–35)
AST: 13 U/L (ref 0–37)
Albumin: 3.5 g/dL (ref 3.5–5.2)
Alkaline Phosphatase: 101 U/L (ref 39–117)
BUN: 8 mg/dL (ref 6–23)
CO2: 25 mEq/L (ref 19–32)
Calcium: 9.7 mg/dL (ref 8.4–10.5)
Chloride: 106 mEq/L (ref 96–112)
Creatinine, Ser: 0.66 mg/dL (ref 0.50–1.10)
GFR calc Af Amer: >90 mL/min (ref >90)
GFR calc non Af Amer: >90 mL/min (ref >90)
Glucose, Bld: 90 mg/dL (ref 70–99)
Potassium: 4.4 mEq/L (ref 3.5–5.1)
Sodium: 139 mEq/L (ref 135–145)
Total Bilirubin: 0.2 mg/dL — ABNORMAL LOW (ref 0.3–1.2)
Total Protein: 6.6 g/dL (ref 6.0–8.3)

## 2012-06-25 LAB — CBC WITH DIFFERENTIAL/PLATELET
Basophils Absolute: 0 10*3/uL (ref 0.0–0.1)
Basophils Relative: 1 % (ref 0–1)
Eosinophils Absolute: 0.2 10*3/uL (ref 0.0–0.7)
Eosinophils Relative: 2 % (ref 0–5)
HCT: 39.7 % (ref 36.0–46.0)
Hemoglobin: 12.9 g/dL (ref 12.0–15.0)
Lymphocytes Relative: 33 % (ref 12–46)
Lymphs Abs: 2.5 10*3/uL (ref 0.7–4.0)
MCH: 30.6 pg (ref 26.0–34.0)
MCHC: 32.5 g/dL (ref 30.0–36.0)
MCV: 94.3 fL (ref 78.0–100.0)
Monocytes Absolute: 0.5 10*3/uL (ref 0.1–1.0)
Monocytes Relative: 6 % (ref 3–12)
Neutro Abs: 4.4 10*3/uL (ref 1.7–7.7)
Neutrophils Relative %: 59 % (ref 43–77)
Platelets: 278 10*3/uL (ref 150–400)
RBC: 4.21 MIL/uL (ref 3.87–5.11)
RDW: 14.4 % (ref 11.5–15.5)
WBC: 7.6 10*3/uL (ref 4.0–10.5)

## 2012-06-25 MED ORDER — ACETAMINOPHEN 325 MG PO TABS
650.0000 mg | ORAL_TABLET | Freq: Four times a day (QID) | ORAL | Status: DC | PRN
  Administered 2012-06-26: 650 mg via ORAL
  Filled 2012-06-25: qty 2

## 2012-06-25 MED ORDER — ONDANSETRON HCL 4 MG PO TABS: 4.0000 mg | ORAL_TABLET | Freq: Four times a day (QID) | ORAL | Status: DC | PRN

## 2012-06-25 MED ORDER — AMITRIPTYLINE HCL 100 MG PO TABS
100.0000 mg | ORAL_TABLET | Freq: Every day | ORAL | Status: DC
  Administered 2012-06-25 – 2012-06-26 (×2): 100 mg via ORAL
  Filled 2012-06-25 (×3): qty 1

## 2012-06-25 MED ORDER — HYDROCODONE-ACETAMINOPHEN 5-325 MG PO TABS
1.0000 | ORAL_TABLET | Freq: Four times a day (QID) | ORAL | Status: DC | PRN
  Administered 2012-06-26 – 2012-06-27 (×3): 1 via ORAL
  Filled 2012-06-25 (×3): qty 1

## 2012-06-25 MED ORDER — IOHEXOL 300 MG/ML  SOLN
75.0000 mL | Freq: Once | INTRAMUSCULAR | Status: AC | PRN
  Administered 2012-06-25: 75 mL via INTRAVENOUS

## 2012-06-25 MED ORDER — SODIUM CHLORIDE 0.9 % IV SOLN
INTRAVENOUS | Status: DC
  Administered 2012-06-25 – 2012-06-27 (×3): via INTRAVENOUS

## 2012-06-25 MED ORDER — SODIUM CHLORIDE 0.9 % IJ SOLN
3.0000 mL | Freq: Two times a day (BID) | INTRAMUSCULAR | Status: DC
  Administered 2012-06-26: 3 mL via INTRAVENOUS

## 2012-06-25 MED ORDER — CLINDAMYCIN PHOSPHATE 600 MG/50ML IV SOLN
600.0000 mg | Freq: Once | INTRAVENOUS | Status: DC
  Filled 2012-06-25: qty 50

## 2012-06-25 MED ORDER — TRAMADOL HCL 50 MG PO TABS
50.0000 mg | ORAL_TABLET | Freq: Four times a day (QID) | ORAL | Status: DC | PRN
  Administered 2012-06-26 (×2): 50 mg via ORAL
  Filled 2012-06-25 (×3): qty 1

## 2012-06-25 MED ORDER — ACETAMINOPHEN 650 MG RE SUPP: 650.0000 mg | Freq: Four times a day (QID) | RECTAL | Status: DC | PRN

## 2012-06-25 MED ORDER — CLINDAMYCIN PHOSPHATE 600 MG/50ML IV SOLN
600.0000 mg | Freq: Three times a day (TID) | INTRAVENOUS | Status: DC
  Administered 2012-06-25 – 2012-06-27 (×5): 600 mg via INTRAVENOUS
  Filled 2012-06-25 (×7): qty 50

## 2012-06-25 MED ORDER — ONDANSETRON HCL 4 MG/2ML IJ SOLN: 4.0000 mg | Freq: Four times a day (QID) | INTRAMUSCULAR | Status: DC | PRN

## 2012-06-25 MED ORDER — ZOLPIDEM TARTRATE 5 MG PO TABS
5.0000 mg | ORAL_TABLET | Freq: Once | ORAL | Status: AC
  Administered 2012-06-25: 5 mg via ORAL
  Filled 2012-06-25: qty 1

## 2012-06-25 NOTE — ED Notes (Signed)
IV team paged.  

## 2012-06-25 NOTE — H&P (Signed)
Lauren Campos is an 57 y.o. female.   Patient was seen and examined on June 25, 2012. PCP - Dr. Madison Hickman. Chief Complaint: Left-sided neck pain. HPI: 57 year old female with history of ongoing tobacco abuse started developing neck pain on the left side since 3 AM today. Along with the neck pain patient also had swelling. Denies any fever chills difficulty swallowing or breathing. Denies any insect bite or trauma. Initially patient had significant pain and had to keep neck turned to the left. She had gone to her PCP who did a CT neck which showed soft tissue swelling concerning for infective process and was referred to the ER. In the ER on call ENT consultant Dr.Teoh was consulted by the ER physician and Dr. Suszanne Conners has advised IV antibiotics and he'll be seeing patient in consult. Patient at this time is afebrile and denies any difficulty swallowing or breathing. She states her swelling in the neck has actually reduced from the morning.   Past Medical History  Diagnosis Date  .   07/20/2006  . BACK PAIN, LOW 07/20/2006  . BREAST LUMP 09/05/2007  . CONSTIPATION, CHRONIC 08/10/2009  . DIVERTICULOSIS, COLON 08/10/2009  . Hemoptysis 08/27/2008  . HYPERLIPIDEMIA 05/08/2007  . HYPERLIPIDEMIA 07/20/2006  . INSOMNIA NOS 07/20/2006  . 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 MIGRAINE, COMMON 05/08/2007  . OBESITY, NOS 07/20/2006    Past Surgical History  Procedure Date  . Tubal ligation   . Abdominal hysterectomy   . Laparoscopic gastric banding     Family History  Problem Relation Age of Onset  . Cancer Mother   . Diabetes Mother   . Heart disease Mother   . Cancer Father   . Diabetes Sister    Social History:  reports that she has been smoking Cigarettes.  She has been smoking about .5 packs per day. She has never used smokeless tobacco. She reports that she does not drink alcohol or use illicit drugs.  Allergies: No Known Allergies   (Not in a hospital admission)  Results for orders placed during the  hospital encounter of 06/25/12 (from the past 48 hour(s))  CBC WITH DIFFERENTIAL     Status: Normal   Collection Time   06/25/12  5:50 PM      Component Value Range Comment   WBC 7.6  4.0 - 10.5 K/uL    RBC 4.21  3.87 - 5.11 MIL/uL    Hemoglobin 12.9  12.0 - 15.0 g/dL    HCT 16.1  09.6 - 04.5 %    MCV 94.3  78.0 - 100.0 fL    MCH 30.6  26.0 - 34.0 pg    MCHC 32.5  30.0 - 36.0 g/dL    RDW 40.9  81.1 - 91.4 %    Platelets 278  150 - 400 K/uL    Neutrophils Relative 59  43 - 77 %    Neutro Abs 4.4  1.7 - 7.7 K/uL    Lymphocytes Relative 33  12 - 46 %    Lymphs Abs 2.5  0.7 - 4.0 K/uL    Monocytes Relative 6  3 - 12 %    Monocytes Absolute 0.5  0.1 - 1.0 K/uL    Eosinophils Relative 2  0 - 5 %    Eosinophils Absolute 0.2  0.0 - 0.7 K/uL    Basophils Relative 1  0 - 1 %    Basophils Absolute 0.0  0.0 - 0.1 K/uL   COMPREHENSIVE METABOLIC PANEL     Status:  Abnormal   Collection Time   06/25/12  5:50 PM      Component Value Range Comment   Sodium 139  135 - 145 mEq/L    Potassium 4.4  3.5 - 5.1 mEq/L    Chloride 106  96 - 112 mEq/L    CO2 25  19 - 32 mEq/L    Glucose, Bld 90  70 - 99 mg/dL    BUN 8  6 - 23 mg/dL    Creatinine, Ser 8.11  0.50 - 1.10 mg/dL    Calcium 9.7  8.4 - 91.4 mg/dL    Total Protein 6.6  6.0 - 8.3 g/dL    Albumin 3.5  3.5 - 5.2 g/dL    AST 13  0 - 37 U/L    ALT 11  0 - 35 U/L    Alkaline Phosphatase 101  39 - 117 U/L    Total Bilirubin 0.2 (*) 0.3 - 1.2 mg/dL    GFR calc non Af Amer >90  >90 mL/min    GFR calc Af Amer >90  >90 mL/min    Ct Soft Tissue Neck W Contrast  06/25/2012  *RADIOLOGY REPORT*  Clinical Data: Acute onset swelling left neck beginning today  CT NECK WITH CONTRAST  Technique:  Multidetector CT imaging of the neck was performed with intravenous contrast.  Contrast: 75mL OMNIPAQUE IOHEXOL 300 MG/ML  SOLN  Comparison:  none  Findings: Apical emphysema bilaterally.  No mass is present in the upper lobes.  There is extensive soft tissue edema in the  left neck.  This extends into the upper breast on the left and extends into the left neck above the clavicle extending to the angle of the jaw posteriorly.  Mildly enlarged supraclavicular lymph nodes on the left.  There is low density edema in the retropharyngeal soft tissues. This extends across the midline posterior to the right internal carotid.  Symmetric hypertrophy of the posterior nasopharynx is most likely reactive.  Palatine tonsils are normal.  Negative for peritonsillar abscess.  There is a soft tissue thickening obscuring the left piriform sinus which could represent neoplasm.  Direct visualization of this area is suggested.  The jugular vein is patent bilaterally.  Subclavian vein appears patent bilaterally.  The thyroid is normal.  Images through the upper chest reveal mild venous engorgement versus edema in the anterior mediastinum.  Visualized intracranial contents are normal.  Paranasal sinuses are clear.  Mastoid sinus is clear.  IMPRESSION: There is extensive edema on the left neck extending into the deep soft tissues of the neck and the left upper breast.  There is retropharyngeal edema.  These findings are felt to be due to infection.  The original site of  infection is not readily apparent.  There is mild engorgement of vessels in the superior mediastinum which could represent mediastinitis or venous engorgement.  Possible 1 cm mass left piriform sinus.  Direct visualization is recommended to rule out a neoplasm.  I discussed the findings by telephone with Dr. Selena Batten.   Original Report Authenticated By: Janeece Riggers, M.D.     Review of Systems  Constitutional: Negative.   HENT:       Left sided neck pain and swelling.  Eyes: Negative.   Respiratory: Negative.   Cardiovascular: Negative.   Gastrointestinal: Negative.   Genitourinary: Negative.   Musculoskeletal: Negative.   Skin: Negative.   Neurological: Negative.   Endo/Heme/Allergies: Negative.   Psychiatric/Behavioral: Negative.      Blood pressure 134/88, pulse  93, temperature 97.8 F (36.6 C), temperature source Oral, resp. rate 18, SpO2 97.00%. Physical Exam  Constitutional: She is oriented to person, place, and time. She appears well-developed and well-nourished. No distress.  HENT:  Head: Normocephalic and atraumatic.  Right Ear: External ear normal.  Left Ear: External ear normal.  Nose: Nose normal.  Mouth/Throat: Oropharynx is clear and moist. No oropharyngeal exudate.  Eyes: Conjunctivae normal are normal. Pupils are equal, round, and reactive to light. Right eye exhibits no discharge. Left eye exhibits no discharge. No scleral icterus.  Neck: Normal range of motion. Neck supple.       Mild swelling on the left side of the chest. There is no tenderness.  Cardiovascular: Normal rate and regular rhythm.   Respiratory: Effort normal and breath sounds normal. No respiratory distress. She has no wheezes. She has no rales.  GI: Soft. Bowel sounds are normal. She exhibits no distension. There is no tenderness. There is no rebound.  Musculoskeletal: She exhibits no edema and no tenderness.  Neurological: She is alert and oriented to person, place, and time.       Moves all extremities.  Skin: Skin is warm and dry. She is not diaphoretic.     Assessment/Plan #1. Possible cellulitis of the neck - patient has been placed on IV clindamycin. At this time I have placed patient on clear liquid diet. Follow ENT consults recommendations. The source of infection is not known. Follow blood cultures. #2. Tobacco abuse - strongly advised to quit smoking.  CODE STATUS - full code.   Eduard Clos 06/25/2012, 8:39 PM

## 2012-06-25 NOTE — ED Provider Notes (Signed)
History     CSN: [id number]  Arrival date & time 06/25/12  1720   First MD Initiated Contact with Patient 06/25/12 1901      No chief complaint on file.   (Consider location/radiation/quality/duration/timing/severity/associated sxs/prior treatment) Patient is a 57 y.o. female presenting with general illness. The history is provided by the patient.  Illness  The current episode started today. The problem occurs frequently. The problem has been gradually improving. The problem is mild. Nothing relieves the symptoms. The symptoms are aggravated by activity and movement. Pertinent negatives include no fever, no double vision, no photophobia, no abdominal pain, no diarrhea, no nausea, no vomiting, no congestion, no headaches, no mouth sores, no rhinorrhea, no sore throat, no stridor, no swollen glands, no muscle aches, no neck pain, no neck stiffness, no cough, no URI, no wheezing and no rash. She has been behaving normally. She has been eating and drinking normally. Urine output has been normal. The last void occurred less than 6 hours ago. Recently, medical care has been given by the PCP. Services received include tests performed.    Past Medical History  Diagnosis Date  .   07/20/2006  . BACK PAIN, LOW 07/20/2006  . BREAST LUMP 09/05/2007  . CONSTIPATION, CHRONIC 08/10/2009  . DIVERTICULOSIS, COLON 08/10/2009  . Hemoptysis 08/27/2008  . HYPERLIPIDEMIA 05/08/2007  . HYPERLIPIDEMIA 07/20/2006  . INSOMNIA NOS 07/20/2006  . 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 MIGRAINE, COMMON 05/08/2007  . OBESITY, NOS 07/20/2006    Past Surgical History  Procedure Date  . Tubal ligation   . Abdominal hysterectomy   . Laparoscopic gastric banding     Family History  Problem Relation Age of Onset  . Cancer Mother   . Diabetes Mother   . Heart disease Mother   . Cancer Father   . Diabetes Sister     History  Substance Use Topics  . Smoking status: Current Every Day Smoker -- 0.5 packs/day    Types: Cigarettes   . Smokeless tobacco: Never Used  . Alcohol Use: No    OB History    Grav Para Term Preterm Abortions TAB SAB Ect Mult Living                  Review of Systems  Constitutional: Negative for fever and fatigue.  HENT: Negative for congestion, sore throat, rhinorrhea, drooling, mouth sores, neck pain and postnasal drip.        Neck swelling  Eyes: Negative for double vision, photophobia and visual disturbance.  Respiratory: Negative for cough, chest tightness, shortness of breath, wheezing and stridor.   Cardiovascular: Negative for chest pain, palpitations and leg swelling.  Gastrointestinal: Negative for nausea, vomiting, abdominal pain and diarrhea.  Genitourinary: Negative for urgency, frequency and difficulty urinating.  Musculoskeletal: Negative for back pain and arthralgias.  Skin: Negative for rash and wound.  Neurological: Negative for weakness and headaches.  Psychiatric/Behavioral: Negative for confusion and agitation.    Allergies  Review of patient's allergies indicates no known allergies.  Home Medications   No current outpatient prescriptions on file.  BP 135/86  Pulse 80  Temp 97.9 F (36.6 C) (Oral)  Resp 18  Ht 5' (1.524 m)  Wt 222 lb 9.6 oz (100.971 kg)  BMI 43.47 kg/m2  SpO2 96%  Physical Exam  Nursing note and vitals reviewed. Constitutional: She is oriented to person, place, and time. She appears well-developed and well-nourished. No distress.  HENT:  Head: Normocephalic and atraumatic.  Mouth/Throat: Oropharynx is clear and  moist.  Eyes: EOM are normal. Pupils are equal, round, and reactive to light.  Neck: Normal range of motion. Neck supple.       Swelling to left anterior neck. No meningismus. No overlying cellulitis. No lymphadenopathy.  Cardiovascular: Normal rate, regular rhythm, normal heart sounds and intact distal pulses.   Pulmonary/Chest: Effort normal and breath sounds normal. She has no wheezes. She has no rales.  Abdominal:  Soft. Bowel sounds are normal. She exhibits no distension. There is no tenderness. There is no rebound and no guarding.  Musculoskeletal: Normal range of motion. She exhibits no edema and no tenderness.  Lymphadenopathy:    She has no cervical adenopathy.  Neurological: She is alert and oriented to person, place, and time. She displays normal reflexes. No cranial nerve deficit. She exhibits normal muscle tone. Coordination normal.  Skin: Skin is warm and dry. No rash noted.  Psychiatric: She has a normal mood and affect. Her behavior is normal.    ED Course  Procedures (including critical care time)  Labs Reviewed  COMPREHENSIVE METABOLIC PANEL - Abnormal; Notable for the following:    Total Bilirubin 0.2 (*)     All other components within normal limits  CBC WITH DIFFERENTIAL  BASIC METABOLIC PANEL  CBC   Ct Soft Tissue Neck W Contrast  06/25/2012  *RADIOLOGY REPORT*  Clinical Data: Acute onset swelling left neck beginning today  CT NECK WITH CONTRAST  Technique:  Multidetector CT imaging of the neck was performed with intravenous contrast.  Contrast: 75mL OMNIPAQUE IOHEXOL 300 MG/ML  SOLN  Comparison:  none  Findings: Apical emphysema bilaterally.  No mass is present in the upper lobes.  There is extensive soft tissue edema in the left neck.  This extends into the upper breast on the left and extends into the left neck above the clavicle extending to the angle of the jaw posteriorly.  Mildly enlarged supraclavicular lymph nodes on the left.  There is low density edema in the retropharyngeal soft tissues. This extends across the midline posterior to the right internal carotid.  Symmetric hypertrophy of the posterior nasopharynx is most likely reactive.  Palatine tonsils are normal.  Negative for peritonsillar abscess.  There is a soft tissue thickening obscuring the left piriform sinus which could represent neoplasm.  Direct visualization of this area is suggested.  The jugular vein is patent  bilaterally.  Subclavian vein appears patent bilaterally.  The thyroid is normal.  Images through the upper chest reveal mild venous engorgement versus edema in the anterior mediastinum.  Visualized intracranial contents are normal.  Paranasal sinuses are clear.  Mastoid sinus is clear.  IMPRESSION: There is extensive edema on the left neck extending into the deep soft tissues of the neck and the left upper breast.  There is retropharyngeal edema.  These findings are felt to be due to infection.  The original site of  infection is not readily apparent.  There is mild engorgement of vessels in the superior mediastinum which could represent mediastinitis or venous engorgement.  Possible 1 cm mass left piriform sinus.  Direct visualization is recommended to rule out a neoplasm.  I discussed the findings by telephone with Dr. Selena Batten.   Original Report Authenticated By: Janeece Riggers, M.D.      1. Neck infection   2. Cellulitis       MDM  23F sent by pcp office for left-sided neck swelling. Pt woke up this morning with swelling to left side of neck without fever, rash,  pain, or URI symptoms. Was seen by pcp and a CT neck was obtained which was concerning for deep tissue infection that extends to retropharyngeal space to left upper breast. Pt well-appearing on exam and airway intact. Oropharynx clear without uvular deviation. Labs reveal normal WBC and lytes. Spoken with ENT who saw the CT. Felt she requires admission to hospital for observation with IV antibiotics. He will see patient in the am for possible scope. Given dose of clindamycin.        Johnnette Gourd, MD 06/26/12 [phone]

## 2012-06-25 NOTE — Telephone Encounter (Signed)
Patient notified of infection in neck and doctors recommendation to go to the emergency dept. Patient stated she would be going to [facility].

## 2012-06-25 NOTE — Progress Notes (Signed)
Pt was admitted to unit from ED. Pt is A&O, VS stable, and skin intact. Pt is resting in bed with daughter currently at bedside.

## 2012-06-25 NOTE — Progress Notes (Addendum)
Chief Complaint  Patient presents with  . "my neck is swollen"    HPI:  Acute visit for Neck swelling: -noticed this morning -reports L lower ant neck swelling that she noticed this morning - could not even turn head was so big - now down -denies: pain, fevers, SOB -denies any trouble with this ever before  ROS: See pertinent positives and negatives per HPI.  Past Medical History  Diagnosis Date  .   07/20/2006  . BACK PAIN, LOW 07/20/2006  . BREAST LUMP 09/05/2007  . CONSTIPATION, CHRONIC 08/10/2009  . DIVERTICULOSIS, COLON 08/10/2009  . Hemoptysis 08/27/2008  . HYPERLIPIDEMIA 05/08/2007  . HYPERLIPIDEMIA 07/20/2006  . INSOMNIA NOS 07/20/2006  . 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 MIGRAINE, COMMON 05/08/2007  . OBESITY, NOS 07/20/2006    Family History  Problem Relation Age of Onset  . Cancer Mother   . Diabetes Mother   . Heart disease Mother   . Cancer Father   . Diabetes Sister     History   Social History  . Marital Status: Single    Spouse Name: N/A    Number of Children: N/A  . Years of Education: N/A   Social History Main Topics  . Smoking status: Current Every Day Smoker -- 0.5 packs/day    Types: Cigarettes  . Smokeless tobacco: Never Used  . Alcohol Use: No  . Drug Use: No  . Sexually Active: None   Other Topics Concern  . None   Social History Narrative  . None    Current outpatient prescriptions:betamethasone valerate lotion (BETA-VAL) 0.1 %, Apply topically daily., Disp: 60 mL, Rfl: 1;  clonazePAM (KLONOPIN) 0.5 MG tablet, Take 1 tablet (0.5 mg total) by mouth 2 (two) times daily., Disp: 60 tablet, Rfl: 2;  traMADol (ULTRAM) 50 MG tablet, Take 1 tablet (50 mg total) by mouth every 6 (six) hours as needed for pain. Maximum dose= 8 tablets per day, Disp: 100 tablet, Rfl: 3 gabapentin (NEURONTIN) 300 MG capsule, Take 1 capsule (300 mg total) by mouth 3 (three) times daily., Disp: 90 capsule, Rfl: 3;  venlafaxine (EFFEXOR) 37.5 MG tablet, Take 1 tablet (37.5 mg  total) by mouth 2 (two) times daily., Disp: 90 tablet, Rfl: 3  EXAM:  Filed Vitals:   06/25/12 1346  BP: 102/62  Pulse: 97  Temp: 98.5 F (36.9 C)    Body mass index is 44.33 kg/(m^2).  GENERAL: vitals reviewed and listed above, alert, oriented, appears well hydrated and in no acute distress  HEENT: atraumatic, conjunttiva clear, no obvious abnormalities on inspection of external nose and ears  NECK: soft mass R side of neck ant inferior neck between clavicle and trapezius muscle compared to L - NTTP - no other nodules or LAD, no fremitus  LUNGS: clear to auscultation bilaterally, no wheezes, rales or rhonchi, good air movement  CV: HRRR, no peripheral edema  MS: moves all extremities without noticeable abnormality  PSYCH: pleasant and cooperative, no obvious depression or anxiety  ASSESSMENT AND PLAN:  Discussed the following assessment and plan:  1. Neck swelling  CT Soft Tissue Neck W Contrast   -patient appears, well no fevers, vitals normal with soft tissue swelling my concern is for retropharyngeal abscess/enfection- discussed with colleague here, ENT and radiology and all advised CT to evaluate but felt infection less likely - ? bleb, cyst possible, abscess less likely given normal vitals and no pain -Pt scheduled for STAT CT scan -Patient advised to return or notify a doctor immediately if symptoms  worsen or persist or new concerns arise.  There are no Patient Instructions on file for this visit.   Terressa Koyanagi  Radiologist called reporting CT results with extensive infection/?retropharyngeal infection - advised likely needs IV abx and further urgent ENT eval. CNA called patient immediately and advised her of results and advised her to go directly and immediately to the ED for treatment/further evaluation.

## 2012-06-25 NOTE — ED Notes (Signed)
Admitting physician at bedside

## 2012-06-25 NOTE — ED Notes (Addendum)
PT awoke at 3 am to find swelling to her L neck (it was so large she couldn't turn her head).  Went to her pcp who orderd a CT.  Pt sent here by Dr Selena Batten from [facility] for CT findings that showed a possible neoplasm r/t edema to her L neck.  Denies pain at this time, but pt does c/o weakness.  Swelling from this am has since reduced.  Dr Selena Batten suggests IV antibiotics and ENT consult for infection.

## 2012-06-26 ENCOUNTER — Inpatient Hospital Stay (HOSPITAL_COMMUNITY): Payer: MEDICAID

## 2012-06-26 ENCOUNTER — Encounter (HOSPITAL_COMMUNITY): Payer: Self-pay | Admitting: Radiology

## 2012-06-26 DIAGNOSIS — R221 Localized swelling, mass and lump, neck: Secondary | ICD-10-CM

## 2012-06-26 DIAGNOSIS — E669 Obesity, unspecified: Secondary | ICD-10-CM

## 2012-06-26 DIAGNOSIS — F17200 Nicotine dependence, unspecified, uncomplicated: Secondary | ICD-10-CM

## 2012-06-26 LAB — CBC
HCT: 35.5 % — ABNORMAL LOW (ref 36.0–46.0)
Hemoglobin: 11.5 g/dL — ABNORMAL LOW (ref 12.0–15.0)
MCV: 93.2 fL (ref 78.0–100.0)
RBC: 3.81 MIL/uL — ABNORMAL LOW (ref 3.87–5.11)
WBC: 6.5 10*3/uL (ref 4.0–10.5)

## 2012-06-26 LAB — BASIC METABOLIC PANEL
CO2: 27 mEq/L (ref 19–32)
Chloride: 107 mEq/L (ref 96–112)
Creatinine, Ser: 0.68 mg/dL (ref 0.50–1.10)
Potassium: 3.7 mEq/L (ref 3.5–5.1)
Sodium: 141 mEq/L (ref 135–145)

## 2012-06-26 MED ORDER — IOHEXOL 300 MG/ML  SOLN
75.0000 mL | Freq: Once | INTRAMUSCULAR | Status: AC | PRN
Start: 2012-06-26 — End: 2012-06-26
  Administered 2012-06-26: 75 mL via INTRAVENOUS

## 2012-06-26 MED ORDER — ZOLPIDEM TARTRATE 5 MG PO TABS
5.0000 mg | ORAL_TABLET | Freq: Once | ORAL | Status: AC
  Administered 2012-06-26: 5 mg via ORAL
  Filled 2012-06-26: qty 1

## 2012-06-26 NOTE — Consult Note (Signed)
Reason for Consult: Left neck swelling Referring Physician: Eduard Clos, MD  HPI:  Lauren Campos is an 57 y.o. female who was admitted yesterday for further evaluation and treatment of her acute left-sided neck swelling. According to the patient, she developed an acute onset left-sided neck discomfort yesterday morning. The onset was sudden. It caused her significant difficulty in turning her head to the left. She otherwise denies any significant dysphagia, odynophagia, or dysphonia. She also denies any dyspnea. She has a long history of tobacco use. However she denies any recent weight loss. The patient was seen by her primary care physician, which order a neck CT scan. The CT showed extensive left-sided neck swelling, extending down to the upper chest. There was also a possible 1 cm mass in the left piriform sinus.  She was therefore referred to the emergency room for further evaluation and treatment.  Past Medical History  Diagnosis Date  .   07/20/2006  . BACK PAIN, LOW 07/20/2006  . BREAST LUMP 09/05/2007  . CONSTIPATION, CHRONIC 08/10/2009  . DIVERTICULOSIS, COLON 08/10/2009  . Hemoptysis 08/27/2008  . HYPERLIPIDEMIA 05/08/2007  . HYPERLIPIDEMIA 07/20/2006  . INSOMNIA NOS 07/20/2006  . 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 MIGRAINE, COMMON 05/08/2007  . OBESITY, NOS 07/20/2006    Past Surgical History  Procedure Date  . Tubal ligation   . Abdominal hysterectomy   . Laparoscopic gastric banding     Family History  Problem Relation Age of Onset  . Cancer Mother   . Diabetes Mother   . Heart disease Mother   . Cancer Father   . Diabetes Sister     Social History:  reports that she has been smoking Cigarettes.  She has been smoking about .5 packs per day. She has never used smokeless tobacco. She reports that she does not drink alcohol or use illicit drugs.  Allergies: No Known Allergies  Medications:  I have reviewed the patient's current medications. Scheduled:   . amitriptyline   100 mg Oral QHS  . clindamycin (CLEOCIN) IV  600 mg Intravenous Q8H  . sodium chloride  3 mL Intravenous Q12H   ZOX:[id number], acetaminophen, HYDROcodone-acetaminophen, ondansetron (ZOFRAN) IV, ondansetron, traMADol  Results for orders placed during the hospital encounter of 06/25/12 (from the past 48 hour(s))  CBC WITH DIFFERENTIAL     Status: Normal   Collection Time   06/25/12  5:50 PM      Component Value Range Comment   WBC 7.6  4.0 - 10.5 K/uL    RBC 4.21  3.87 - 5.11 MIL/uL    Hemoglobin 12.9  12.0 - 15.0 g/dL    HCT 14.7  82.9 - 56.2 %    MCV 94.3  78.0 - 100.0 fL    MCH 30.6  26.0 - 34.0 pg    MCHC 32.5  30.0 - 36.0 g/dL    RDW 13.0  86.5 - 78.4 %    Platelets 278  150 - 400 K/uL    Neutrophils Relative 59  43 - 77 %    Neutro Abs 4.4  1.7 - 7.7 K/uL    Lymphocytes Relative 33  12 - 46 %    Lymphs Abs 2.5  0.7 - 4.0 K/uL    Monocytes Relative 6  3 - 12 %    Monocytes Absolute 0.5  0.1 - 1.0 K/uL    Eosinophils Relative 2  0 - 5 %    Eosinophils Absolute 0.2  0.0 - 0.7 K/uL    Basophils  Relative 1  0 - 1 %    Basophils Absolute 0.0  0.0 - 0.1 K/uL   COMPREHENSIVE METABOLIC PANEL     Status: Abnormal   Collection Time   06/25/12  5:50 PM      Component Value Range Comment   Sodium 139  135 - 145 mEq/L    Potassium 4.4  3.5 - 5.1 mEq/L    Chloride 106  96 - 112 mEq/L    CO2 25  19 - 32 mEq/L    Glucose, Bld 90  70 - 99 mg/dL    BUN 8  6 - 23 mg/dL    Creatinine, Ser 4.54  0.50 - 1.10 mg/dL    Calcium 9.7  8.4 - 09.8 mg/dL    Total Protein 6.6  6.0 - 8.3 g/dL    Albumin 3.5  3.5 - 5.2 g/dL    AST 13  0 - 37 U/L    ALT 11  0 - 35 U/L    Alkaline Phosphatase 101  39 - 117 U/L    Total Bilirubin 0.2 (*) 0.3 - 1.2 mg/dL    GFR calc non Af Amer >90  >90 mL/min    GFR calc Af Amer >90  >90 mL/min   BASIC METABOLIC PANEL     Status: Abnormal   Collection Time   06/26/12  6:55 AM      Component Value Range Comment   Sodium 141  135 - 145 mEq/L    Potassium 3.7  3.5 -  5.1 mEq/L    Chloride 107  96 - 112 mEq/L    CO2 27  19 - 32 mEq/L    Glucose, Bld 105 (*) 70 - 99 mg/dL    BUN 7  6 - 23 mg/dL    Creatinine, Ser 1.19  0.50 - 1.10 mg/dL    Calcium 9.2  8.4 - 14.7 mg/dL    GFR calc non Af Amer >90  >90 mL/min    GFR calc Af Amer >90  >90 mL/min   CBC     Status: Abnormal   Collection Time   06/26/12  6:55 AM      Component Value Range Comment   WBC 6.5  4.0 - 10.5 K/uL    RBC 3.81 (*) 3.87 - 5.11 MIL/uL    Hemoglobin 11.5 (*) 12.0 - 15.0 g/dL    HCT 82.9 (*) 56.2 - 46.0 %    MCV 93.2  78.0 - 100.0 fL    MCH 30.2  26.0 - 34.0 pg    MCHC 32.4  30.0 - 36.0 g/dL    RDW 13.0  86.5 - 78.4 %    Platelets 294  150 - 400 K/uL     Ct Soft Tissue Neck W Contrast  06/25/2012  *RADIOLOGY REPORT*  Clinical Data: Acute onset swelling left neck beginning today  CT NECK WITH CONTRAST  Technique:  Multidetector CT imaging of the neck was performed with intravenous contrast.  Contrast: 75mL OMNIPAQUE IOHEXOL 300 MG/ML  SOLN  Comparison:  none  Findings: Apical emphysema bilaterally.  No mass is present in the upper lobes.  There is extensive soft tissue edema in the left neck.  This extends into the upper breast on the left and extends into the left neck above the clavicle extending to the angle of the jaw posteriorly.  Mildly enlarged supraclavicular lymph nodes on the left.  There is low density edema in the retropharyngeal soft tissues. This extends across the  midline posterior to the right internal carotid.  Symmetric hypertrophy of the posterior nasopharynx is most likely reactive.  Palatine tonsils are normal.  Negative for peritonsillar abscess.  There is a soft tissue thickening obscuring the left piriform sinus which could represent neoplasm.  Direct visualization of this area is suggested.  The jugular vein is patent bilaterally.  Subclavian vein appears patent bilaterally.  The thyroid is normal.  Images through the upper chest reveal mild venous engorgement versus  edema in the anterior mediastinum.  Visualized intracranial contents are normal.  Paranasal sinuses are clear.  Mastoid sinus is clear.  IMPRESSION: There is extensive edema on the left neck extending into the deep soft tissues of the neck and the left upper breast.  There is retropharyngeal edema.  These findings are felt to be due to infection.  The original site of  infection is not readily apparent.  There is mild engorgement of vessels in the superior mediastinum which could represent mediastinitis or venous engorgement.  Possible 1 cm mass left piriform sinus.  Direct visualization is recommended to rule out a neoplasm.  I discussed the findings by telephone with Dr. Selena Batten.   Original Report Authenticated By: Janeece Riggers, M.D.     Review of Systems  Constitutional: Negative.  HENT:  Left sided neck pain and swelling.  Eyes: Negative.  Respiratory: Negative.  Cardiovascular: Negative.  Gastrointestinal: Negative.  Genitourinary: Negative.  Musculoskeletal: Negative.  Skin: Negative.  Neurological: Negative.  Endo/Heme/Allergies: Negative.  Psychiatric/Behavioral: Negative.   Blood pressure 124/68, pulse 77, temperature 98 F (36.7 C), temperature source Oral, resp. rate 20, height 5' (1.524 m), weight 100.971 kg (222 lb 9.6 oz), SpO2 92.00%. Physical Exam  Constitutional: She is oriented to person, place, and time. She appears well-developed and well-nourished. No distress.  HENT:  Head: Normocephalic and atraumatic.  Right Ear: External ear normal.  Left Ear: External ear normal.  Nose: Nose normal.  Mouth/Throat: Oropharynx is clear and moist. No oropharyngeal exudate.  The uvula is midline. Eyes: Conjunctivae normal are normal. Pupils are equal, round, and reactive to light. Right eye exhibits no discharge. Left eye exhibits no discharge. No scleral icterus.  Neck: Normal range of motion. Neck supple. Mild swelling on the lower left neck and left side of the chest. There is no  tenderness.  Cardiovascular: Normal rate and regular rhythm.  Respiratory: Effort normal and breath sounds normal. No respiratory distress. She has no wheezes. She has no rales.  GI: Soft. Bowel sounds are normal. She exhibits no distension. There is no tenderness. There is no rebound.  Musculoskeletal: She exhibits no edema and no tenderness.  Neurological: She is alert and oriented to person, place, and time.  Moves all extremities.  Skin: Skin is warm and dry. She is not diaphoretic.  Procedure:  Flexible Fiberoptic Laryngoscopy Anesthesia: Topical oxymetazoline and lidocaine Indication: Left neck swelling.  Possible laryngeal mass. Description: Risks, benefits, and alternatives of flexible endoscopy were explained to the patient. Specific mention was made of the risk of throat numbness with difficulty swallowing, possible bleeding from the nose and mouth, and pain from the procedure.  The patient gave oral consent to proceed.  The nasal cavities were decongested and anesthetised with a combination of oxymetazoline and 4% lidocaine solution.  The flexible scope was inserted into the right nasal cavity and advanced towards the nasopharynx.  Visualized mucosa over the turbinates and septum were normal.  The nasopharynx was clear.  Oropharyngeal walls were symmetric and mobile without lesion,  mass, or edema.  Hypopharynx was also without  lesion or edema.  Larynx was mobile without lesions.  No lesions or asymmetry in the supraglottic larynx.  Arytenoid mucosa was normal.  True vocal folds were mobile and symmetric.     Assessment/Plan: Left-sided neck swelling of unknown etiology. Question possible angioedema or allergic response. The swelling has significantly decreased today.  No visible piriform sinus mass noted on today's fiberoptic laryngoscopy evaluation.  In light of the significant clinical improvement, I would recommend a repeat neck CT scan with contrast. If the piriform mass persists, I  will take the patient to the operating room for direct laryngoscopy and biopsy.   Shyvonne Chastang,SUI W 06/26/2012, 10:56 AM

## 2012-06-26 NOTE — Progress Notes (Addendum)
[facility] PROGRESS NOTE  Lauren DRAUGHON Campos:[id number] DOB: 1956-04-07 DOA: 06/25/2012 PCP: Rogelia Boga, MD  Assessment/Plan:  Extensive Soft Tissue infection in the neck -Per CT Scan:   extensive edema on the left neck extending into the deep  soft tissues of the neck and the left upper breast. There is retropharyngeal edema. Possible 1 cm mass left piriform sinus. Direct visualization is recommended to rule out a neoplasm. -Swelling decreased this morning.  Patient with more movement of her neck. -started on IV clindamycin at the time of admission -ENT Consulted, Dr. Suszanne Conners.  Morbid obesity - Counseled regarding importance of weight loss  Possible 1 cm mass in left piriform sinus.   -ENT please advise what work up may be necessary.  Tobacco Abuse -Advised to quit.   Code Status: full Family Communication:  Disposition Plan: to home when appropriate   Consultants:  ENT  Procedures:    Antibiotics:  Clindamycin (06/25/12  HPI/Subjective: 57 year old female with history of ongoing tobacco abuse started developing neck pain on the left side since 3 AM today. Along with the neck pain patient also had swelling. Denies any fever chills difficulty swallowing or breathing. Denies any insect bite or trauma. Initially patient had significant pain and had to keep neck turned to the left. She had gone to her PCP who did a CT neck which showed soft tissue swelling concerning for infective process and was referred to the ER. In the ER on call ENT consultant Dr.Teoh was consulted by the ER physician and Dr. Suszanne Conners has advised IV antibiotics and he'll be seeing patient in consult. Patient at this time is afebrile and denies any difficulty swallowing or breathing.  Patient reports decreased swelling this morning.   Objective: Filed Vitals:   06/25/12 1736 06/25/12 2015 06/25/12 2116 06/26/12 0500  BP: 134/88 118/73 135/86 124/68  Pulse: 93 80 80 77  Temp: 97.8 F  (36.6 C)  97.9 F (36.6 C) 98 F (36.7 C)  TempSrc: Oral  Oral Oral  Resp: 18  18 20   Height:   5' (1.524 m)   Weight:   100.971 kg (222 lb 9.6 oz)   SpO2: 97% 98% 96% 92%    Intake/Output Summary (Last 24 hours) at 06/26/12 0947 Last data filed at 06/26/12 9562  Gross per 24 hour  Intake  802.5 ml  Output      0 ml  Net  802.5 ml   Filed Weights   06/25/12 2116  Weight: 100.971 kg (222 lb 9.6 oz)    Exam:   General: A&O, NAD, WD, WN AA Female, sitting on the side of the bed.  Cardiovascular: RRR, No M/R/G, No lower extremity edema  Chest:  Palpable edema in the left supraclavicular area, Nt to palpation.  No erythema.  No lymph nodes palpated.  Respiratory: CTA no w/c/r, no accessory muscle use.  Abdomen: Soft, NT, ND, +BS, No Masses   Data Reviewed: Basic Metabolic Panel:  Lab 06/26/12 1308 06/25/12 1750  NA 141 139  K 3.7 4.4  CL 107 106  CO2 27 25  GLUCOSE 105* 90  BUN 7 8  CREATININE 0.68 0.66  CALCIUM 9.2 9.7  MG -- --  PHOS -- --   Liver Function Tests:  Lab 06/25/12 1750  AST 13  ALT 11  ALKPHOS 101  BILITOT 0.2*  PROT 6.6  ALBUMIN 3.5   CBC:  Lab 06/26/12 0655 06/25/12 1750  WBC 6.5 7.6  NEUTROABS -- 4.4  HGB 11.5* 12.9  HCT 35.5* 39.7  MCV 93.2 94.3  PLT 294 278     Studies: Ct Soft Tissue Neck W Contrast  06/25/2012  *RADIOLOGY REPORT*  Clinical Data: Acute onset swelling left neck beginning today  CT NECK WITH CONTRAST  Technique:  Multidetector CT imaging of the neck was performed with intravenous contrast.  Contrast: 75mL OMNIPAQUE IOHEXOL 300 MG/ML  SOLN  Comparison:  none  Findings: Apical emphysema bilaterally.  No mass is present in the upper lobes.  There is extensive soft tissue edema in the left neck.  This extends into the upper breast on the left and extends into the left neck above the clavicle extending to the angle of the jaw posteriorly.  Mildly enlarged supraclavicular lymph nodes on the left.  There is low density  edema in the retropharyngeal soft tissues. This extends across the midline posterior to the right internal carotid.  Symmetric hypertrophy of the posterior nasopharynx is most likely reactive.  Palatine tonsils are normal.  Negative for peritonsillar abscess.  There is a soft tissue thickening obscuring the left piriform sinus which could represent neoplasm.  Direct visualization of this area is suggested.  The jugular vein is patent bilaterally.  Subclavian vein appears patent bilaterally.  The thyroid is normal.  Images through the upper chest reveal mild venous engorgement versus edema in the anterior mediastinum.  Visualized intracranial contents are normal.  Paranasal sinuses are clear.  Mastoid sinus is clear.  IMPRESSION: There is extensive edema on the left neck extending into the deep soft tissues of the neck and the left upper breast.  There is retropharyngeal edema.  These findings are felt to be due to infection.  The original site of  infection is not readily apparent.  There is mild engorgement of vessels in the superior mediastinum which could represent mediastinitis or venous engorgement.  Possible 1 cm mass left piriform sinus.  Direct visualization is recommended to rule out a neoplasm.  I discussed the findings by telephone with Dr. Selena Batten.   Original Report Authenticated By: Janeece Riggers, M.D.     Scheduled Meds:    . amitriptyline  100 mg Oral QHS  . clindamycin (CLEOCIN) IV  600 mg Intravenous Q8H  . sodium chloride  3 mL Intravenous Q12H   Continuous Infusions:    . sodium chloride 75 mL/hr at 06/25/12 2230    Principal Problem:  *Cellulitis Active Problems:  TOBACCO DEPENDENCE  Piriform sinus tumor    Time spent: 30 min.   Conley Canal [facility] Pager [phone] 8PM-8AM, please contact night-coverage at [email or web address], password [facility] 06/26/2012, 9:47 AM  LOS: 1 day    Attending Patient was seen and examined, agree with the above assessment and plan.  After the above was transcribed, patient had ENT evaluation-current recommendations are to repeat a CT scan of the neck, to reassess piriform fossa mass. Fortunately her soft tissue infection is significantly better, swelling in the neck and the upper chest area has significantly decreased, she is also afebrile and without any leukocytosis. For now we will continue with IV clindamycin.  Windell Norfolk MD

## 2012-06-27 LAB — COMPREHENSIVE METABOLIC PANEL
Albumin: 3.1 g/dL — ABNORMAL LOW (ref 3.5–5.2)
BUN: 9 mg/dL (ref 6–23)
Calcium: 9.1 mg/dL (ref 8.4–10.5)
Creatinine, Ser: 0.63 mg/dL (ref 0.50–1.10)
GFR calc Af Amer: >90 mL/min (ref >90)
Glucose, Bld: 106 mg/dL — ABNORMAL HIGH (ref 70–99)
Total Protein: 5.8 g/dL — ABNORMAL LOW (ref 6.0–8.3)

## 2012-06-27 LAB — CBC
HCT: 33.8 % — ABNORMAL LOW (ref 36.0–46.0)
Hemoglobin: 11.1 g/dL — ABNORMAL LOW (ref 12.0–15.0)
MCH: 30.6 pg (ref 26.0–34.0)
MCHC: 32.8 g/dL (ref 30.0–36.0)
RDW: 14.4 % (ref 11.5–15.5)

## 2012-06-27 LAB — PROTIME-INR
INR: 1.08 (ref 0.00–1.49)
Prothrombin Time: 13.9 seconds (ref 11.6–15.2)

## 2012-06-27 MED ORDER — CLINDAMYCIN HCL 300 MG PO CAPS: 600.0000 mg | ORAL_CAPSULE | Freq: Three times a day (TID) | ORAL | Status: DC

## 2012-06-27 MED ORDER — CLINDAMYCIN HCL 300 MG PO CAPS: 300.0000 mg | ORAL_CAPSULE | Freq: Four times a day (QID) | ORAL | Status: DC

## 2012-06-27 NOTE — Progress Notes (Signed)
Patient discharge teaching given, including activity, diet, follow-up appoints, and medications. Patient verbalized understanding of all discharge instructions. IV access was d/c'd. Vitals are stable. Skin is intact except as charted in most recent assessments. Pt to be escorted out by NT, to be driven home by family. 

## 2012-06-27 NOTE — Discharge Summary (Signed)
-   I have reviewed the data and information as above. - He'll follow with ENT in 2 weeks. - He will also need a repeat a CT scan in 6 months to evaluate the nodules in the lungs.

## 2012-06-27 NOTE — Progress Notes (Signed)
Subjective: Pt reports feeling better.  No dysphagia, odynophagia.  Swelling mostly resolved. CT shows no pyriform mass.  A small amount of retropharyngeal fluid remains.  Objective: Vital signs in last 24 hours: Temp:  [98.2 F (36.8 C)-98.6 F (37 C)] 98.2 F (36.8 C) (02/04 2043) Pulse Rate:  [82-88] 88  (02/04 2043) Resp:  [14-20] 16  (02/04 2043) BP: (102-113)/(69-76) 102/69 mmHg (02/04 2043) SpO2:  [94 %-97 %] 95 % (02/04 2043)  Physical Exam  Constitutional: She is oriented to person, place, and time. She appears well-developed and well-nourished. No distress.  Head: Normocephalic and atraumatic.  Right Ear: External ear normal.  Left Ear: External ear normal.  Nose: Nose normal.  Mouth/Throat: Oropharynx is clear and moist. No oropharyngeal exudate. The uvula is midline.  Eyes: Conjunctivae normal are normal. Pupils are equal, round, and reactive to light. Right eye exhibits no discharge. Left eye exhibits no discharge. No scleral icterus.  Neck: Normal range of motion. Neck supple. No significant swelling. There is no tenderness.  Cardiovascular: Normal rate and regular rhythm.  Respiratory: Effort normal and breath sounds normal. No respiratory distress. She has no wheezes. She has no rales.  GI: Soft. Bowel sounds are normal. She exhibits no distension. There is no tenderness. There is no rebound.  Musculoskeletal: She exhibits no edema and no tenderness.  Neurological: She is alert and oriented to person, place, and time.  Moves all extremities.  Skin: Skin is warm and dry. She is not diaphoretic.   Basename 06/27/12 0605 06/26/12 0655  WBC 5.6 6.5  HGB 11.1* 11.5*  HCT 33.8* 35.5*  PLT 281 294    Basename 06/27/12 0605 06/26/12 0655  NA 140 141  K 3.5 3.7  CL 107 107  CO2 24 27  GLUCOSE 106* 105*  BUN 9 7  CREATININE 0.63 0.68  CALCIUM 9.1 9.2    Medications:  I have reviewed the patient's current medications. Scheduled:   . amitriptyline  100 mg  Oral QHS  . clindamycin (CLEOCIN) IV  600 mg Intravenous Q8H  . sodium chloride  3 mL Intravenous Q12H    Assessment/Plan: The patient's left-sided neck swelling has resolved. Unknown etiology, possibly secondary to angioedema or allergic response. No pyriform sinus mass on repeat CT scan.  Incidental findings of lung nodules.  In light of her smoking history, pt should undergo a repeat chest CT in 6 months.  Spoke to pt regarding her CT findings.  Pt may f/u with me as an outpatient on a prn basis.   LOS: 2 days   Marlenne Ridge,SUI W 06/27/2012, 8:51 AM

## 2012-06-27 NOTE — Care Management Note (Signed)
    Page 1 of 1   06/27/2012     10:17:37 AM   CARE MANAGEMENT NOTE 06/27/2012  Patient:  Lauren Campos, Lauren Campos   Account Number:  [id number]  Date Initiated:  06/27/2012  Documentation initiated by:  Letha Cape  Subjective/Objective Assessment:   dx cellulitis  admit- lives alone.  pta independent.     Action/Plan:   Anticipated DC Date:  06/27/2012   Anticipated DC Plan:  HOME/SELF CARE      DC Planning Services  CM consult      Choice offered to / List presented to:             Status of service:  Completed, signed off Medicare Important Message given?   (If response is "NO", the following Medicare IM given date fields will be blank) Date Medicare IM given:   Date Additional Medicare IM given:    Discharge Disposition:  HOME/SELF CARE  Per UR Regulation:  Reviewed for med. necessity/level of care/duration of stay  If discussed at Long Length of Stay Meetings, dates discussed:    Comments:  06/27/12 10:11 Letha Cape RN, BSN [phone] patient lives alone, pta independent.  Patient has transportation at discharge, she states if she has only two meds to get at dc she could afford to do so from [place] off $4 list, her Amitriptyline is on the $4 list.  I explained to patient the [employer] ast,  NCM will continue to follow for dc needs.

## 2012-06-27 NOTE — Discharge Summary (Signed)
Physician Discharge Summary  Lauren Campos AVW:[id number] DOB: 06-12-1955 DOA: 06/25/2012  PCP: Rogelia Boga, MD  Admit date: 06/25/2012 Discharge date: 06/27/2012  Time spent: 40 minutes  Recommendations for Outpatient Follow-up:   #1 quit smoking #2 followup with primary care physician in one week for a CBC check and to ensure neck swelling has completely resolved. #3 followup with primary care physician regarding lung nodules found on CT. Re- imaging recommended in 6 months. #4 followup with Dr. Ina Homes, as needed  Discharge Diagnoses:  Principal Problem:  *Cellulitis Active Problems:  TOBACCO DEPENDENCE  Piriform sinus tumor   Discharge Condition: Much improved, stable, neck swelling decreased, range of motion increased  Diet recommendation: Regular diet  Filed Weights   06/25/12 2116  Weight: 100.971 kg (222 lb 9.6 oz)    History of present illness at the time of admission:   57 year old female with history of ongoing tobacco abuse started developing neck pain on the left side since 3 AM today. Along with the neck pain patient also had swelling. Denies any fever chills difficulty swallowing or breathing. Denies any insect bite or trauma. Initially patient had significant pain and had to keep neck turned to the left. She had gone to her PCP who did a CT neck which showed soft tissue swelling concerning for infective process and was referred to the ER. In the ER on call ENT consultant Dr.Teoh was consulted by the ER physician and Dr. Suszanne Conners has advised IV antibiotics and he'll be seeing patient in consult. Patient at this time is afebrile and denies any difficulty swallowing or breathing. She states her swelling in the neck has actually reduced from the morning.  Hospital Course:   Extensive Soft Tissue infection in the neck versus possible allergic reaction Per CT Scan the patient had extensive edema on the left neck extending into the deep soft tissues and the left  upper breast. There was concern for a 1 cm mass in her left piriform sinus.  She was seen in consultation by Dr. Suszanne Conners who recommended a followup CT with IV contrast to more closely examine her piriform sinus. Fortunately on the followup CT scan the mass was not seen. She was treated with IV clindamycin while inpatient and improved rapidly. The left supraclavicular swelling had virtually resolved by the time of discharge. She was discharged to home with 12 more days of by mouth clindamycin 300 mg 4 times a day for her soft tissue infection. She will follow up with her primary care physician and ENT if necessary.  Lung nodules Her followup CT neck revealed lung nodules in both her left and right upper lobes. Given that the patient is a tobacco smoker, we strongly advised cessation, and requests that the patient have repeat imaging of her lungs in 6 months.  Tobacco Abuse  Advised to quit.   Procedures: Flexible Fiberoptic Laryngoscopy, by Dr. Suszanne Conners 06/26/2012   Consultations:  ENT, Dr. Suszanne Conners  Discharge Exam: Filed Vitals:   06/26/12 0500 06/26/12 1015 06/26/12 1410 06/26/12 2043  BP: 124/68 113/76 109/72 102/69  Pulse: 77 82 87 88  Temp: 98 F (36.7 C) 98.6 F (37 C) 98.5 F (36.9 C) 98.2 F (36.8 C)  TempSrc: Oral Oral Oral Oral  Resp: 20 14 20 16   Height:      Weight:      SpO2: 92% 94% 97% 95%    General:  Well-developed well-nourished African American female sitting on the side of the bed and appears nontoxic. HEENT: Supraclavicular  swelling on the left is significantly reduced, she has no palpable lymph nodes, pupils are equal and round, mucous membranes are moist, no erythema in oropharynx. Cardiovascular: Regular rate and rhythm, no murmurs rubs or gallops Respiratory: Clear to auscultation, no accessory muscle use Abdomen: Soft, nontender, nondistended, positive bowel sounds, no masses   Discharge Instructions  Discharge Orders    Future Appointments: Provider:  Department: Dept Phone: Center:   07/19/2012 2:45 PM [facility], [place] [phone] None     Future Orders Please Complete By Expires   Diet - low sodium heart healthy      Increase activity slowly          Medication List     As of 06/27/2012  4:02 PM    TAKE these medications         amitriptyline 100 MG tablet   Commonly known as: ELAVIL   Take 100 mg by mouth at bedtime.      betamethasone valerate lotion 0.1 %   Commonly known as: VALISONE   Apply topically daily.      clindamycin 300 MG capsule   Commonly known as: CLEOCIN   Take 1 capsule (300 mg total) by mouth 4 (four) times daily.      HYDROcodone-acetaminophen 5-325 MG per tablet   Commonly known as: NORCO/VICODIN   Take 1 tablet by mouth every 6 (six) hours as needed. For pain      traMADol 50 MG tablet   Commonly known as: ULTRAM   Take 1 tablet (50 mg total) by mouth every 6 (six) hours as needed for pain. Maximum dose= 8 tablets per day           Follow-up Information    Follow up with Rogelia Boga, MD. Schedule an appointment as soon as possible for a visit in 1 week. [facility] follow up for Neck infection.)    Contact information:   [address] Lauren Campos [facility] [place] [address] [phone]           The results of significant diagnostics from this hospitalization (including imaging, microbiology, ancillary and laboratory) are listed below for reference.    Significant Diagnostic Studies: Ct Soft Tissue Neck W Contrast  06/26/2012  *RADIOLOGY REPORT*  Clinical Data: Improvement in neck pain. Post flexible laryngoscopy without abnormality noted.  Reevaluate possible piriform sinus mass.  CT NECK WITH CONTRAST  Technique:  Multidetector CT imaging of the neck was performed with intravenous contrast.  Contrast: 75mL OMNIPAQUE IOHEXOL 300 MG/ML  SOLN  Comparison: 06/25/2012.  Findings: Left piriform sinus is now aerated and relatively symmetric when compared  to the right without mass identified.  Within the visualized upper lung zones, emphysematous changes are noted with small pleural effusions greater on the left. Additionally, scattered nodules are noted measuring 5 mm right upper lobe, 6 mm left upper lobe and 4 mm superior segment left lower lobe.  These are not completely assessed on present examination.  If the patient is at high risk for bronchogenic carcinoma, follow- up chest CT at 6-12 months is recommended.  If the patient is at low risk for bronchogenic carcinoma, follow-up chest CT at 12 months is recommended.  This recommendation follows the consensus statement: Guidelines for Management of Small Pulmonary Nodules Detected on CT Scans: A Statement from the Fleischner Society as published in Radiology 2005; 237:395-400.  Retropharyngeal fluid collection remains with maximal thickness at the C3 level with AP dimension of 3.8 mm.  Haziness of  fat planes along the left supraclavicular region and left chest wall with left supraclavicular adenopathy.  Scattered lymph nodes throughout the neck largest in the left level II region measuring 1.1 x 1.5 x 1.7 cm.  Etiology of these findings is indeterminate. Considerations include infectious process, inflammatory process, angioedema or result of vascular engorgement.  Visualized intracranial structures and orbital structures unremarkable.  No primary parotid, submandibular, posterior-superior nasopharyngeal, palatine tonsil, tongue base, glottic or thyroid lesion noted.  Atherosclerotic type changes with calcification aortic arch and carotid bifurcation without hemodynamically significant stenosis.  No calcification of the longus colonic.  Scattered cervical spondylotic changes without bony destructive lesion.  IMPRESSION: Left piriform sinus is now aerated and relatively symmetric when compared to the right without mass identified.  Retropharyngeal fluid collection remains with maximal thickness at the C3 level with  AP dimension of 3.8 mm.  Haziness of fat planes along the left supraclavicular region and left chest wall with left supraclavicular adenopathy.  Scattered lymph nodes throughout the neck largest in the left level II region measuring 1.1 x 1.5 x 1.7 cm.  Etiology of these findings is indeterminate. Considerations include infectious process, inflammatory process, angioedema or result of vascular engorgement. Result of neoplasm felt to be a less likely consideration.  Within the visualized upper lung zones, emphysematous changes are noted with small pleural effusions greater on the left. Additionally, scattered nodules are noted measuring 5 mm right upper lobe, 6 mm left upper lobe and 4 mm superior segment left lower lobe.  These are not completely assessed on present examination.  If the patient is at high risk for bronchogenic carcinoma, follow- up chest CT at 6-12 months is recommended.  If the patient is at low risk for bronchogenic carcinoma, follow-up chest CT at 12 months is recommended.  This recommendation follows the consensus statement: Guidelines for Management of Small Pulmonary Nodules Detected on CT Scans: A Statement from the Fleischner Society as published in Radiology 2005; 237:395-400.   Original Report Authenticated By: Lacy Duverney, M.D.    Ct Soft Tissue Neck W Contrast  06/25/2012  *RADIOLOGY REPORT*  Clinical Data: Acute onset swelling left neck beginning today  CT NECK WITH CONTRAST  Technique:  Multidetector CT imaging of the neck was performed with intravenous contrast.  Contrast: 75mL OMNIPAQUE IOHEXOL 300 MG/ML  SOLN  Comparison:  none  Findings: Apical emphysema bilaterally.  No mass is present in the upper lobes.  There is extensive soft tissue edema in the left neck.  This extends into the upper breast on the left and extends into the left neck above the clavicle extending to the angle of the jaw posteriorly.  Mildly enlarged supraclavicular lymph nodes on the left.  There is low  density edema in the retropharyngeal soft tissues. This extends across the midline posterior to the right internal carotid.  Symmetric hypertrophy of the posterior nasopharynx is most likely reactive.  Palatine tonsils are normal.  Negative for peritonsillar abscess.  There is a soft tissue thickening obscuring the left piriform sinus which could represent neoplasm.  Direct visualization of this area is suggested.  The jugular vein is patent bilaterally.  Subclavian vein appears patent bilaterally.  The thyroid is normal.  Images through the upper chest reveal mild venous engorgement versus edema in the anterior mediastinum.  Visualized intracranial contents are normal.  Paranasal sinuses are clear.  Mastoid sinus is clear.  IMPRESSION: There is extensive edema on the left neck extending into the deep soft tissues of the neck  and the left upper breast.  There is retropharyngeal edema.  These findings are felt to be due to infection.  The original site of  infection is not readily apparent.  There is mild engorgement of vessels in the superior mediastinum which could represent mediastinitis or venous engorgement.  Possible 1 cm mass left piriform sinus.  Direct visualization is recommended to rule out a neoplasm.  I discussed the findings by telephone with Dr. Selena Batten.   Original Report Authenticated By: Janeece Riggers, M.D.      Labs: Basic Metabolic Panel:  Lab 06/27/12 9147 06/26/12 0655 06/25/12 1750  NA 140 141 139  K 3.5 3.7 4.4  CL 107 107 106  CO2 24 27 25   GLUCOSE 106* 105* 90  BUN 9 7 8   CREATININE 0.63 0.68 0.66  CALCIUM 9.1 9.2 9.7  MG -- -- --  PHOS -- -- --   Liver Function Tests:  Lab 06/27/12 0605 06/25/12 1750  AST 11 13  ALT 8 11  ALKPHOS 84 101  BILITOT 0.2* 0.2*  PROT 5.8* 6.6  ALBUMIN 3.1* 3.5   CBC:  Lab 06/27/12 0605 06/26/12 0655 06/25/12 1750  WBC 5.6 6.5 7.6  NEUTROABS -- -- 4.4  HGB 11.1* 11.5* 12.9  HCT 33.8* 35.5* 39.7  MCV 93.1 93.2 94.3  PLT 281 294 278     Signed:  Conley Canal  [facility] [phone] 06/27/2012, 4:02 PM

## 2012-06-28 ENCOUNTER — Telehealth: Payer: Self-pay | Admitting: Internal Medicine

## 2012-06-28 NOTE — Telephone Encounter (Signed)
Patient called stating that she needs a post hosp/surgical clearance Wed. Per discharge summary and there are no available appts. Please assist.

## 2012-06-28 NOTE — Telephone Encounter (Signed)
Patient just out of hospital yesterday 2/5 after infection from brain to breast.  Is to have follow up appt in one week.    Also needing pre-op for surgery 2/11 or 2/13 and needs MD opinion on whether it should be put off until end of Feb.    Also needing follow up Scan during the week to be sure infection is clear before surgery. Asking to get these Appointments scheduled. Please review and contact patient at [phone].

## 2012-06-28 NOTE — Telephone Encounter (Signed)
Appt with Dr. Kirtland Bouchard made for next week when he returns. Encounter closed.

## 2012-07-01 NOTE — ED Provider Notes (Signed)
I saw and evaluated the patient, reviewed the resident's note and I agree with the findings and plan.  57 year old female with left-sided neck and upper chest swelling. Just a quick progression of symptoms is more consistent with a possible allergic response has a postinfectious etiology. Case was discussed with ENT though who reviewed the patient's CT scan recommended admission to the hospital as well as antibiotics. On exam patient is in no acute distress. She is protecting her airway. No increased work of breathing. No stridor. Handling secretions. No evidence of impending airway compromise. Will admit for further observation and ENT consultation.  Raeford Razor, MD 07/01/12 [phone]

## 2012-07-02 ENCOUNTER — Telehealth: Payer: Self-pay | Admitting: Family Medicine

## 2012-07-02 NOTE — Telephone Encounter (Signed)
Pt is requesting work note from 2-3 thru 07-02-12. Pt would like to return to work on 07-03-12. Please fax note to [phone]

## 2012-07-03 ENCOUNTER — Telehealth: Payer: Self-pay | Admitting: Internal Medicine

## 2012-07-03 NOTE — Telephone Encounter (Signed)
Pls advise if it is ok to right note.

## 2012-07-03 NOTE — Telephone Encounter (Signed)
Letter for RTW note done and faxed to [phone].

## 2012-07-03 NOTE — Telephone Encounter (Signed)
ok 

## 2012-07-03 NOTE — Telephone Encounter (Signed)
Error

## 2012-07-04 ENCOUNTER — Ambulatory Visit (INDEPENDENT_AMBULATORY_CARE_PROVIDER_SITE_OTHER): Payer: Self-pay | Admitting: Internal Medicine

## 2012-07-04 ENCOUNTER — Encounter: Payer: Self-pay | Admitting: Internal Medicine

## 2012-07-04 VITALS — BP 136/80 | HR 94 | Temp 98.0°F | Resp 20 | Wt 227.0 lb

## 2012-07-04 DIAGNOSIS — E782 Mixed hyperlipidemia: Secondary | ICD-10-CM

## 2012-07-04 DIAGNOSIS — E785 Hyperlipidemia, unspecified: Secondary | ICD-10-CM

## 2012-07-04 DIAGNOSIS — L0390 Cellulitis, unspecified: Secondary | ICD-10-CM

## 2012-07-04 NOTE — Progress Notes (Signed)
Subjective:    Patient ID: Lauren Campos, female    DOB: 28-Aug-1955, 57 y.o.   MRN: [id number]  HPI  57 year old patient who is seen following hospital discharge. She was admitted for a few days for a cellulitis involving primarily the left neck region. She responded well to antibiotic therapy and is now completing oral clindamycin therapy. She has been followed by orthopedics and is scheduled for elective the left shoulder surgery for a torn rotator cuff. She has dyslipidemia which has been controlled with diet. She has done well since her hospital discharge. She was evaluated by ENT and is scheduled for a followup in one month. Evaluation included a CT of the neck that revealed some nonspecific bilateral upper lobe pulmonary nodules. The patient does continue to smoke but has decreased her tobacco consumption.  Hospital records reviewed  Past Medical History  Diagnosis Date  .   07/20/2006  . BACK PAIN, LOW 07/20/2006  . BREAST LUMP 09/05/2007  . CONSTIPATION, CHRONIC 08/10/2009  . DIVERTICULOSIS, COLON 08/10/2009  . Hemoptysis 08/27/2008  . HYPERLIPIDEMIA 05/08/2007  . HYPERLIPIDEMIA 07/20/2006  . INSOMNIA NOS 07/20/2006  . 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 MIGRAINE, COMMON 05/08/2007  . OBESITY, NOS 07/20/2006    History   Social History  . Marital Status: Single    Spouse Name: N/A    Number of Children: N/A  . Years of Education: N/A   Occupational History  . Not on file.   Social History Main Topics  . Smoking status: Current Every Day Smoker -- 0.50 packs/day    Types: Cigarettes  . Smokeless tobacco: Never Used  . Alcohol Use: No  . Drug Use: No  . Sexually Active: Not on file   Other Topics Concern  . Not on file   Social History Narrative  . No narrative on file    Past Surgical History  Procedure Laterality Date  . Tubal ligation    . Abdominal hysterectomy    . Laparoscopic gastric banding      Family History  Problem Relation Age of Onset  . Cancer Mother   .  Diabetes Mother   . Heart disease Mother   . Cancer Father   . Diabetes Sister     No Known Allergies  Current Outpatient Prescriptions on File Prior to Visit  Medication Sig Dispense Refill  . amitriptyline (ELAVIL) 100 MG tablet Take 100 mg by mouth at bedtime.      . betamethasone valerate lotion (BETA-VAL) 0.1 % Apply topically daily.  60 mL  1  . clindamycin (CLEOCIN) 300 MG capsule Take 1 capsule (300 mg total) by mouth 4 (four) times daily.  44 capsule  0  . HYDROcodone-acetaminophen (NORCO/VICODIN) 5-325 MG per tablet Take 1 tablet by mouth every 6 (six) hours as needed. For pain      . traMADol (ULTRAM) 50 MG tablet Take 1 tablet (50 mg total) by mouth every 6 (six) hours as needed for pain. Maximum dose= 8 tablets per day  100 tablet  3   No current facility-administered medications on file prior to visit.    BP 136/80  Pulse 94  Temp(Src) 98 F (36.7 C) (Oral)  Resp 20  Wt 227 lb (102.967 kg)  BMI 44.33 kg/m2  SpO2 95%       Review of Systems  Constitutional: Negative.   HENT: Negative for hearing loss, congestion, sore throat, rhinorrhea, dental problem, sinus pressure and tinnitus.   Eyes: Negative for pain, discharge and visual disturbance.  Respiratory: Negative for cough and shortness of breath.   Cardiovascular: Negative for chest pain, palpitations and leg swelling.  Gastrointestinal: Negative for nausea, vomiting, abdominal pain, diarrhea, constipation, blood in stool and abdominal distention.  Genitourinary: Negative for dysuria, urgency, frequency, hematuria, flank pain, vaginal bleeding, vaginal discharge, difficulty urinating, vaginal pain and pelvic pain.  Musculoskeletal: Negative for joint swelling, arthralgias (Left shoulder pain) and gait problem.  Skin: Negative for rash.  Neurological: Negative for dizziness, syncope, speech difficulty, weakness, numbness and headaches.  Hematological: Negative for adenopathy.  Psychiatric/Behavioral:  Negative for behavioral problems, dysphoric mood and agitation. The patient is not nervous/anxious.        Objective:   Physical Exam  Constitutional: She is oriented to person, place, and time. She appears well-developed and well-nourished.  HENT:  Head: Normocephalic.  Right Ear: External ear normal.  Left Ear: External ear normal.  Mouth/Throat: Oropharynx is clear and moist.  Eyes: Conjunctivae and EOM are normal. Pupils are equal, round, and reactive to light.  Neck: Normal range of motion. Neck supple. No thyromegaly present.  No obvious soft tissue swelling or adenopathy  Cardiovascular: Normal rate, regular rhythm, normal heart sounds and intact distal pulses.   Pulmonary/Chest: Effort normal and breath sounds normal.  Abdominal: Soft. Bowel sounds are normal. She exhibits no mass. There is no tenderness.  Musculoskeletal: Normal range of motion.  Lymphadenopathy:    She has no cervical adenopathy.  Neurological: She is alert and oriented to person, place, and time.  Skin: Skin is warm and dry. No rash noted.  Psychiatric: She has a normal mood and affect. Her behavior is normal.          Assessment & Plan:   Status post saline as the left neck area. Very nice clinical response to antibiotic therapy. Will complete oral therapy Left rotator cuff tear. We'll delay elective surgery for at least 2 weeks following completion of antibiotic therapy Tobacco use. Total tobacco cessation encouraged

## 2012-07-04 NOTE — Patient Instructions (Signed)
Return in 6 months for followup and repeat chest CT  Smoking tobacco is very bad for your health. You should stop smoking immediately.

## 2012-07-09 ENCOUNTER — Encounter: Payer: Self-pay | Admitting: Internal Medicine

## 2012-07-09 ENCOUNTER — Ambulatory Visit (INDEPENDENT_AMBULATORY_CARE_PROVIDER_SITE_OTHER): Payer: Self-pay | Admitting: Internal Medicine

## 2012-07-09 ENCOUNTER — Ambulatory Visit (INDEPENDENT_AMBULATORY_CARE_PROVIDER_SITE_OTHER)
Admission: RE | Admit: 2012-07-09 | Discharge: 2012-07-09 | Disposition: A | Discharge status: Home / Self Care | Payer: Self-pay | Source: Ambulatory Visit | Attending: Internal Medicine | Admitting: Internal Medicine

## 2012-07-09 ENCOUNTER — Ambulatory Visit: Payer: Self-pay | Admitting: Internal Medicine

## 2012-07-09 VITALS — BP 108/60 | HR 98 | Temp 98.7°F | Wt 226.0 lb

## 2012-07-09 DIAGNOSIS — M25571 Pain in right ankle and joints of right foot: Secondary | ICD-10-CM | POA: Insufficient documentation

## 2012-07-09 DIAGNOSIS — M25579 Pain in unspecified ankle and joints of unspecified foot: Secondary | ICD-10-CM

## 2012-07-09 MED ORDER — AMITRIPTYLINE HCL 100 MG PO TABS: 100.0000 mg | ORAL_TABLET | Freq: Every day | ORAL | Status: DC

## 2012-07-09 MED ORDER — BETAMETHASONE VALERATE 0.1 % EX LOTN: TOPICAL_LOTION | Freq: Every day | CUTANEOUS | Status: DC

## 2012-07-09 NOTE — Patient Instructions (Addendum)
Apply cool compress as directed

## 2012-07-09 NOTE — Assessment & Plan Note (Addendum)
57 year old African American female slipped on ice on Saturday.  She rolled her right ankle/foot. She likely has lateral ankle sprain. She has pain with weightbearing. Obtain x-ray to rule out fracture. Refer to [facility] orthopedics for possible ankle immobilizer.  Use tramadol for pain.  Patient advised to use cool compress and ACE wrap for now.

## 2012-07-09 NOTE — Progress Notes (Signed)
  Subjective:    Patient ID: Lauren Campos, female    DOB: August 06, 1955, 57 y.o.   MRN: [id number]  HPI  57 year old African American female with history of hyperlipidemia and tobacco use complains of right ankle pain. Patient reports she slipped on the ice on Saturday while getting out of her car. Her right foot rolled underneath her car. She now complains of mild swelling and pain with weightbearing. She rates pain as 5/10.  Review of Systems Patient denies calf swelling or pain  Past Medical History  Diagnosis Date  .   07/20/2006  . BACK PAIN, LOW 07/20/2006  . BREAST LUMP 09/05/2007  . CONSTIPATION, CHRONIC 08/10/2009  . DIVERTICULOSIS, COLON 08/10/2009  . Hemoptysis 08/27/2008  . HYPERLIPIDEMIA 05/08/2007  . HYPERLIPIDEMIA 07/20/2006  . INSOMNIA NOS 07/20/2006  . 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 MIGRAINE, COMMON 05/08/2007  . OBESITY, NOS 07/20/2006    History   Social History  . Marital Status: Single    Spouse Name: N/A    Number of Children: N/A  . Years of Education: N/A   Occupational History  . Not on file.   Social History Main Topics  . Smoking status: Current Every Day Smoker -- 0.50 packs/day    Types: Cigarettes  . Smokeless tobacco: Never Used  . Alcohol Use: No  . Drug Use: No  . Sexually Active: Not on file   Other Topics Concern  . Not on file   Social History Narrative  . No narrative on file    Past Surgical History  Procedure Laterality Date  . Tubal ligation    . Abdominal hysterectomy    . Laparoscopic gastric banding      Family History  Problem Relation Age of Onset  . Cancer Mother   . Diabetes Mother   . Heart disease Mother   . Cancer Father   . Diabetes Sister     No Known Allergies  Current Outpatient Prescriptions on File Prior to Visit  Medication Sig Dispense Refill  . traMADol (ULTRAM) 50 MG tablet Take 1 tablet (50 mg total) by mouth every 6 (six) hours as needed for pain. Maximum dose= 8 tablets per day  100 tablet  3  .  clindamycin (CLEOCIN) 300 MG capsule Take 1 capsule (300 mg total) by mouth 4 (four) times daily.  44 capsule  0  . HYDROcodone-acetaminophen (NORCO/VICODIN) 5-325 MG per tablet Take 1 tablet by mouth every 6 (six) hours as needed. For pain       No current facility-administered medications on file prior to visit.    BP 108/60  Pulse 98  Temp(Src) 98.7 F (37.1 C) (Oral)  Wt 226 lb (102.513 kg)  BMI 44.14 kg/m2  SpO2 97%       Objective:   Physical Exam  Constitutional: She appears well-developed and well-nourished.  Cardiovascular: Normal rate, regular rhythm and normal heart sounds.   Pulmonary/Chest: Effort normal.  Musculoskeletal:  Mild right ankle swelling, right ankle joint stable.  Pain with inversion, medial pain with anterior translation          Assessment & Plan:

## 2012-07-10 ENCOUNTER — Telehealth: Payer: Self-pay | Admitting: Internal Medicine

## 2012-07-10 NOTE — Telephone Encounter (Signed)
Pt would like results of ankle xray. (ordered by Dr Artist Pais)

## 2012-07-10 NOTE — Telephone Encounter (Signed)
Pt aware of xray results

## 2012-07-13 ENCOUNTER — Ambulatory Visit (INDEPENDENT_AMBULATORY_CARE_PROVIDER_SITE_OTHER): Payer: Self-pay | Admitting: Family Medicine

## 2012-07-13 ENCOUNTER — Encounter: Payer: Self-pay | Admitting: Family Medicine

## 2012-07-13 ENCOUNTER — Ambulatory Visit (INDEPENDENT_AMBULATORY_CARE_PROVIDER_SITE_OTHER)
Admission: RE | Admit: 2012-07-13 | Discharge: 2012-07-13 | Disposition: A | Discharge status: Home / Self Care | Payer: Self-pay | Source: Ambulatory Visit | Attending: Family Medicine | Admitting: Family Medicine

## 2012-07-13 VITALS — BP 140/70 | Temp 98.5°F | Wt 229.0 lb

## 2012-07-13 DIAGNOSIS — R222 Localized swelling, mass and lump, trunk: Secondary | ICD-10-CM

## 2012-07-13 NOTE — Progress Notes (Signed)
  Subjective:    Patient ID: Lauren Campos, female    DOB: 20-Jul-1955, 57 y.o.   MRN: [id number]  HPI Patient seen with recurrent left supraclavicular edema. She presented here in early February with neck edema and was sent for CT of the neck which showed soft tissue swelling with possible concern for infection. She was subsequently referred to the emergency room and admitted and seen in consultation by EENT. CT scan showed extensive edema left neck extending soft tissues and left upper breast. There was concern for possible 1 cm mass left puriform sinus. ENT recommended followup CT with close attention to puriform sinus. Followup CT scan did not show puriform mass. Treat with clindamycin initially IV then oral. There is some question of angioedema type response the she never had any facial swelling. Eventually her edema improved. Her CT scan revealed some nonspecific nodules left and right upper lobes. She does have a long history of cigarette use.  No weight loss. No cough. No increased dyspnea.   Review of Systems  Constitutional: Negative for fever, appetite change and unexpected weight change.  Respiratory: Negative for cough and shortness of breath.   Hematological: Negative for adenopathy.       Objective:   Physical Exam  Constitutional: She appears well-developed and well-nourished.  HENT:  Mouth/Throat: Oropharynx is clear and moist.  Neck: Neck supple. No thyromegaly present.  Patient has some obvious left supraclavicular edema but no obvious adenopathy. No erythema. No warmth. Nontender. She does not have any anterior cervical or posterior cervical adenopathy  Cardiovascular: Normal rate and regular rhythm.   Pulmonary/Chest: Effort normal and breath sounds normal. No respiratory distress. She has no wheezes. She has no rales.  No axillary adenopathy.  Lymphadenopathy:    She has no cervical adenopathy.          Assessment & Plan:  Recurrent left supraclavicular  edema in a smoker with some nonspecific upper lung nodules noted on recent CT of the neck. Differential includes angioedema -though felt to be less likely, infectious (no recent fever,erythema, cough, etc) vs other. She's not any worrisome symptoms such as cough, increased dyspnea, or weight loss. Start with chest x-ray. Consider CT chest to further evaluate. Followup promptly for any fever or dypsnea.

## 2012-07-16 ENCOUNTER — Telehealth: Payer: Self-pay | Admitting: Internal Medicine

## 2012-07-16 NOTE — Telephone Encounter (Signed)
Pt informed of x-ray result and excuse from work faxed as requested, confirmation received.

## 2012-07-16 NOTE — Telephone Encounter (Signed)
Caller: Lauren Campos/Patient; Phone: [phone]; Reason for Call: Patient was seen on Friday, 2/21 and calling for the CXR results.  Also asking for a note for work and asked that it be faxed to [phone], atten: [employer] [facility] [facility]).   Her best contact number is [phone].

## 2012-07-16 NOTE — Telephone Encounter (Signed)
Patient called back to report that she does not need a work excuse for today but to cover her for tomorrow. Please add to previous two request.

## 2012-07-16 NOTE — Progress Notes (Signed)
Quick Note:  Pt informed earlier with a phone note request ______

## 2012-07-16 NOTE — Telephone Encounter (Signed)
ok 

## 2012-07-16 NOTE — Telephone Encounter (Signed)
Patient called stating that she need an excuse for work extension for today as she is still having difficulty walking and would like that faxed to [phone].  Please assist. And also patient would like a call back with cxr results. Please assist with both requests.

## 2012-07-19 ENCOUNTER — Encounter (INDEPENDENT_AMBULATORY_CARE_PROVIDER_SITE_OTHER): Payer: Self-pay

## 2012-08-02 ENCOUNTER — Encounter (INDEPENDENT_AMBULATORY_CARE_PROVIDER_SITE_OTHER): Payer: Self-pay

## 2012-08-09 ENCOUNTER — Encounter (INDEPENDENT_AMBULATORY_CARE_PROVIDER_SITE_OTHER): Payer: Self-pay

## 2012-08-09 ENCOUNTER — Ambulatory Visit: Payer: Self-pay | Admitting: Internal Medicine

## 2012-08-09 ENCOUNTER — Ambulatory Visit (INDEPENDENT_AMBULATORY_CARE_PROVIDER_SITE_OTHER): Payer: Self-pay | Admitting: Physician Assistant

## 2012-08-09 VITALS — BP 140/84 | HR 89 | Temp 98.0°F | Resp 18 | Ht 61.0 in | Wt 227.0 lb

## 2012-08-09 NOTE — Progress Notes (Signed)
  HISTORY: Lauren Campos is a 57 y.o.female who received an 10cm lap-band in July 2005 by Dr. Daphine Deutscher. She has not been seen since March 2010 and has since gained 13 lbs. She is eager to get back on track with weight loss. She complains of increasing hunger and larger than desired portions. She has no regurgitation or reflux symptoms. She wants a fill today.  VITAL SIGNS: Filed Vitals:   08/09/12 1220  BP: 140/84  Pulse: 89  Temp: 98 F (36.7 C)  Resp: 18    PHYSICAL EXAM: Physical exam reveals a very well-appearing 57 y.o.female in no apparent distress Neurologic: Awake, alert, oriented Psych: Bright affect, conversant Respiratory: Breathing even and unlabored. No stridor or wheezing Abdomen: Soft, nontender, nondistended to palpation. Incisions well-healed. No incisional hernias. Port easily palpated. Extremities: Atraumatic, good range of motion.  ASSESMENT: 57 y.o.  female  s/p 10cm lap-band.   PLAN: The patient's port was accessed with a 20G Huber needle without difficulty. Clear fluid was aspirated and 1 mL saline was added to the port to give a total predicted volume of 2 mL. The patient was able to swallow water without difficulty following the procedure and was instructed to take clear liquids for the next 24-48 hours and advance slowly as tolerated.

## 2012-08-09 NOTE — Patient Instructions (Signed)
Take clear liquids tonight. Thin protein shakes are ok to start tomorrow morning. Slowly advance your diet thereafter. Call us if you have persistent vomiting or regurgitation, night cough or reflux symptoms. Return as scheduled or sooner if you notice no changes in hunger/portion sizes.  

## 2012-08-15 ENCOUNTER — Ambulatory Visit: Payer: Self-pay | Admitting: Internal Medicine

## 2012-08-16 ENCOUNTER — Ambulatory Visit (INDEPENDENT_AMBULATORY_CARE_PROVIDER_SITE_OTHER): Payer: Self-pay | Admitting: Internal Medicine

## 2012-08-16 ENCOUNTER — Other Ambulatory Visit: Payer: Self-pay | Admitting: *Deleted

## 2012-08-16 ENCOUNTER — Encounter: Payer: Self-pay | Admitting: Internal Medicine

## 2012-08-16 ENCOUNTER — Encounter (INDEPENDENT_AMBULATORY_CARE_PROVIDER_SITE_OTHER): Payer: Self-pay

## 2012-08-16 VITALS — BP 120/80 | HR 99 | Temp 98.4°F | Resp 18 | Wt 217.0 lb

## 2012-08-16 DIAGNOSIS — M25579 Pain in unspecified ankle and joints of unspecified foot: Secondary | ICD-10-CM

## 2012-08-16 DIAGNOSIS — M25571 Pain in right ankle and joints of right foot: Secondary | ICD-10-CM

## 2012-08-16 DIAGNOSIS — E669 Obesity, unspecified: Secondary | ICD-10-CM

## 2012-08-16 MED ORDER — AMITRIPTYLINE HCL 100 MG PO TABS: 100.0000 mg | ORAL_TABLET | Freq: Every day | ORAL | Status: DC

## 2012-08-16 NOTE — Patient Instructions (Signed)
Call or return to clinic prn if these symptoms worsen or fail to improve as anticipated.

## 2012-08-16 NOTE — Progress Notes (Signed)
  Subjective:    Patient ID: Lauren Campos, female    DOB: [birth year], 57 y.o.   MRN: [id number]  HPI  57 year old patient who has a history of exogenous obesity who suffered a right lateral ankle strain about one month ago. She continues to have some persistent pain and soft tissue swelling. Radiographs were negative for acute fracture. She has been using tramadol and anti-inflammatory medications with some benefit. She is anxious to start walking again this spring  Past Medical History  Diagnosis Date  .   07/20/2006  . BACK PAIN, LOW 07/20/2006  . BREAST LUMP 09/05/2007  . CONSTIPATION, CHRONIC 08/10/2009  . DIVERTICULOSIS, COLON 08/10/2009  . Hemoptysis 08/27/2008  . HYPERLIPIDEMIA 05/08/2007  . HYPERLIPIDEMIA 07/20/2006  . INSOMNIA NOS 07/20/2006  . 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 MIGRAINE, COMMON 05/08/2007  . OBESITY, NOS 07/20/2006    History   Social History  . Marital Status: Single    Spouse Name: N/A    Number of Children: N/A  . Years of Education: N/A   Occupational History  . Not on file.   Social History Main Topics  . Smoking status: Current Every Day Smoker -- 0.50 packs/day    Types: Cigarettes  . Smokeless tobacco: Never Used  . Alcohol Use: No  . Drug Use: No  . Sexually Active: Not on file   Other Topics Concern  . Not on file   Social History Narrative  . No narrative on file    Past Surgical History  Procedure Laterality Date  . Tubal ligation    . Abdominal hysterectomy    . Laparoscopic gastric banding      Family History  Problem Relation Age of Onset  . Cancer Mother   . Diabetes Mother   . Heart disease Mother   . Cancer Father   . Diabetes Sister     No Known Allergies  Current Outpatient Prescriptions on File Prior to Visit  Medication Sig Dispense Refill  . amitriptyline (ELAVIL) 100 MG tablet Take 1 tablet (100 mg total) by mouth at bedtime.  30 tablet  2  . traMADol (ULTRAM) 50 MG tablet Take 1 tablet (50 mg total) by mouth every 6  (six) hours as needed for pain. Maximum dose= 8 tablets per day  100 tablet  3   No current facility-administered medications on file prior to visit.    BP 120/80  Pulse 99  Temp(Src) 98.4 F (36.9 C) (Oral)  Resp 18  Wt 217 lb (98.431 kg)  BMI 41.02 kg/m2  SpO2 98%       Review of Systems  Musculoskeletal: Positive for joint swelling.       Objective:   Physical Exam  Constitutional: She appears well-developed and well-nourished. No distress.  Blood pressure 120/80  Musculoskeletal:  Mild soft tissue swelling and tenderness over the right lateral ankle maximal beneath the fibular head          Assessment & Plan:   Slowly resolving right ankle sprain. The patient will try an Ace bandage continue anti-inflammatories Exogenous obesity. Lap band recently adjusted

## 2012-08-20 ENCOUNTER — Telehealth: Payer: Self-pay | Admitting: *Deleted

## 2012-08-20 NOTE — Telephone Encounter (Signed)
Pt was seen a month ago for right ankle pain and pt stated that Dr Artist Pais recommended a boot for her to wear and she wanted to know where to get it.  Told pt that we referred her to orthopedics and that would be where she gets the boot from.  However, pt refused original referral cause she states that she does not have insurance.  Told pt to go to a local pharmacy store and get an ankle brace for now and if she changed her mind about the referral to call us back.  Pt verbalized understanding and had no questions.

## 2012-08-23 ENCOUNTER — Encounter (HOSPITAL_COMMUNITY): Payer: Self-pay | Admitting: Emergency Medicine

## 2012-08-23 ENCOUNTER — Emergency Department (HOSPITAL_COMMUNITY)
Admission: EM | Admit: 2012-08-23 | Discharge: 2012-08-23 | Disposition: A | Discharge status: Home / Self Care | Payer: Self-pay | Attending: Emergency Medicine | Admitting: Emergency Medicine

## 2012-08-23 DIAGNOSIS — E669 Obesity, unspecified: Secondary | ICD-10-CM | POA: Insufficient documentation

## 2012-08-23 DIAGNOSIS — Z8719 Personal history of other diseases of the digestive system: Secondary | ICD-10-CM | POA: Insufficient documentation

## 2012-08-23 DIAGNOSIS — Z8742 Personal history of other diseases of the female genital tract: Secondary | ICD-10-CM | POA: Insufficient documentation

## 2012-08-23 DIAGNOSIS — M25579 Pain in unspecified ankle and joints of unspecified foot: Secondary | ICD-10-CM | POA: Insufficient documentation

## 2012-08-23 DIAGNOSIS — F17200 Nicotine dependence, unspecified, uncomplicated: Secondary | ICD-10-CM | POA: Insufficient documentation

## 2012-08-23 DIAGNOSIS — Z862 Personal history of diseases of the blood and blood-forming organs and certain disorders involving the immune mechanism: Secondary | ICD-10-CM | POA: Insufficient documentation

## 2012-08-23 DIAGNOSIS — G43909 Migraine, unspecified, not intractable, without status migrainosus: Secondary | ICD-10-CM | POA: Insufficient documentation

## 2012-08-23 DIAGNOSIS — G8911 Acute pain due to trauma: Secondary | ICD-10-CM | POA: Insufficient documentation

## 2012-08-23 DIAGNOSIS — Z79899 Other long term (current) drug therapy: Secondary | ICD-10-CM | POA: Insufficient documentation

## 2012-08-23 DIAGNOSIS — M25571 Pain in right ankle and joints of right foot: Secondary | ICD-10-CM

## 2012-08-23 DIAGNOSIS — Z8639 Personal history of other endocrine, nutritional and metabolic disease: Secondary | ICD-10-CM | POA: Insufficient documentation
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
## 2012-08-23 NOTE — ED Provider Notes (Signed)
Medical screening examination/treatment/procedure(s) were performed by non-physician practitioner and as supervising physician I was immediately available for consultation/collaboration.   [name]. Oletta Lamas, MD 08/23/12 2213

## 2012-08-24 ENCOUNTER — Encounter (INDEPENDENT_AMBULATORY_CARE_PROVIDER_SITE_OTHER): Payer: Self-pay | Admitting: General Surgery

## 2012-08-24 ENCOUNTER — Ambulatory Visit (INDEPENDENT_AMBULATORY_CARE_PROVIDER_SITE_OTHER): Payer: Self-pay | Admitting: General Surgery

## 2012-08-24 ENCOUNTER — Telehealth (INDEPENDENT_AMBULATORY_CARE_PROVIDER_SITE_OTHER): Payer: Self-pay

## 2012-08-24 VITALS — BP 146/88 | HR 92 | Temp 97.2°F | Resp 16 | Ht 61.0 in | Wt 214.8 lb

## 2012-08-24 DIAGNOSIS — Z9884 Bariatric surgery status: Secondary | ICD-10-CM | POA: Insufficient documentation

## 2012-08-24 NOTE — Progress Notes (Signed)
History: Patient returns to the office with a history of a 10 cm band placed in July of 2005. She has had somewhat infrequent followup but saw Mardelle Matte 2 weeks ago and 1 cc was added to her band due to increase portion size and weight gain. She states that within a couple of days she was usually unable to tolerate solid food with almost immediate regurgitation. Liquids have generally been staying down but sometimes coming back as well. She called we brought her in to the office today.  Exam: BP 146/88  Pulse 92  Temp(Src) 97.2 F (36.2 C) (Temporal)  Resp 16  Ht 5\' 1"  (1.549 m)  Wt 214 lb 12.8 oz (97.433 kg)  BMI 40.61 kg/m2 Total weight loss 29 pounds, 13 pounds from last visit General: Obese but otherwise well-appearing female Abdomen: Soft nontender. Port site looks fine.  Assessment and plan: Restriction following her last fill. We decided to remove half of the previous fill and 0.5 cc was removed. Abdomen expected volume of 1.5 cc. There was a fair amount of pressure filling the syringe. She will call if this does not relieve her over restriction.

## 2012-08-24 NOTE — Telephone Encounter (Signed)
I called and asked the pt to be here for a 2:15 appointment per Dr Johna Sheriff.  She agrees.

## 2012-08-24 NOTE — Telephone Encounter (Signed)
The pt called in after 2 weeks of vomiting at least twice a day.  She had her fill by Mardelle Matte.  She can't even keep liquids down.  I have paged Dr Johna Sheriff to see if he can work her in today.

## 2012-12-30 ENCOUNTER — Other Ambulatory Visit: Payer: Self-pay | Admitting: Internal Medicine

## 2013-01-03 ENCOUNTER — Other Ambulatory Visit: Payer: Self-pay | Admitting: Internal Medicine

## 2013-01-03 ENCOUNTER — Ambulatory Visit: Payer: Self-pay | Admitting: Family Medicine

## 2013-01-07 ENCOUNTER — Ambulatory Visit (INDEPENDENT_AMBULATORY_CARE_PROVIDER_SITE_OTHER): Payer: Self-pay | Admitting: Family Medicine

## 2013-01-07 ENCOUNTER — Encounter: Payer: Self-pay | Admitting: Family Medicine

## 2013-01-07 ENCOUNTER — Ambulatory Visit: Payer: Self-pay | Admitting: Internal Medicine

## 2013-01-07 ENCOUNTER — Other Ambulatory Visit: Payer: Self-pay

## 2013-01-07 VITALS — BP 130/72 | HR 90 | Temp 98.1°F | Wt 220.0 lb

## 2013-01-07 DIAGNOSIS — R062 Wheezing: Secondary | ICD-10-CM

## 2013-01-07 DIAGNOSIS — R05 Cough: Secondary | ICD-10-CM

## 2013-01-07 MED ORDER — METHYLPREDNISOLONE ACETATE 80 MG/ML IJ SUSP
80.0000 mg | Freq: Once | INTRAMUSCULAR | Status: AC
  Administered 2013-01-07: 80 mg via INTRAMUSCULAR

## 2013-01-07 MED ORDER — AMITRIPTYLINE HCL 100 MG PO TABS: 100.0000 mg | ORAL_TABLET | Freq: Every day | ORAL | Status: DC

## 2013-01-07 NOTE — Patient Instructions (Addendum)
Proair 2 puffs every 6 hours as needed for cough or wheeze. Follow up promptly for any fever or worsening symptoms.

## 2013-01-07 NOTE — Progress Notes (Signed)
  Subjective:    Patient ID: Lauren Campos, female    DOB: [birth year], 57 y.o.   MRN: [id number]  HPI  3 weeks of sore throat and cough.   Cough is dry. No fever. Some fatigue.  No PND.  Smoker.  No hemoptysis. She denies any focal facial pain or headaches. No recent appetite or weight changes. She is wheezing some off and on. No history of asthma.  Past Medical History  Diagnosis Date  .   07/20/2006  . BACK PAIN, LOW 07/20/2006  . BREAST LUMP 09/05/2007  . CONSTIPATION, CHRONIC 08/10/2009  . DIVERTICULOSIS, COLON 08/10/2009  . Hemoptysis 08/27/2008  . HYPERLIPIDEMIA 05/08/2007  . HYPERLIPIDEMIA 07/20/2006  . INSOMNIA NOS 07/20/2006  . 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 MIGRAINE, COMMON 05/08/2007  . OBESITY, NOS 07/20/2006   Past Surgical History  Procedure Laterality Date  . Tubal ligation    . Abdominal hysterectomy    . Laparoscopic gastric banding      reports that she has been smoking Cigarettes.  She has been smoking about 0.50 packs per day. She has never used smokeless tobacco. She reports that she does not drink alcohol or use illicit drugs. family history includes Cancer in her father and mother; Diabetes in her mother and sister; Heart disease in her mother. No Known Allergies     Review of Systems  Constitutional: Negative for fever and chills.  HENT: Negative for sore throat.   Respiratory: Positive for cough and wheezing.   Cardiovascular: Negative for chest pain.  Neurological: Negative for dizziness and headaches.       Objective:   Physical Exam  Constitutional: She appears well-developed and well-nourished. No distress.  Neck: Neck supple. No thyromegaly present.  Cardiovascular: Normal rate and regular rhythm.   Pulmonary/Chest:  Patient has diffuse wheezes. No rales. No retractions  Musculoskeletal: She exhibits no edema.  Lymphadenopathy:    She has no cervical adenopathy.          Assessment & Plan:  Cough with reactive airway component.  Depo-Medrol 80 mg given. Sample of Provera inhaler 2 puffs every 6 hours as needed for cough and wheeze. Followup promptly for any fever or worsening symptoms. Smoking cessation discussed.

## 2013-02-19 ENCOUNTER — Telehealth: Payer: Self-pay | Admitting: Internal Medicine

## 2013-02-19 NOTE — Telephone Encounter (Addendum)
Pt request to see Dr Caryl Never. Pt states he is familier w/ her symptoms from issue on 2/21.  Pt states the symptoms are returning and she thinks it is due to mold in her home. Pt would like an appt asap to fu on these symptoms. Pt unable to work w/out interuption. No appts until Mon except SD.  pls advise.

## 2013-02-20 NOTE — Telephone Encounter (Signed)
Done kh

## 2013-02-20 NOTE — Telephone Encounter (Signed)
I see same day and acute visits on Monday. Just fit patient in where you can and what is good for the patient

## 2013-02-20 NOTE — Telephone Encounter (Signed)
lmom/kh 

## 2013-02-25 ENCOUNTER — Encounter: Payer: Self-pay | Admitting: Family Medicine

## 2013-02-25 ENCOUNTER — Ambulatory Visit (INDEPENDENT_AMBULATORY_CARE_PROVIDER_SITE_OTHER): Payer: Self-pay | Admitting: Family Medicine

## 2013-02-25 VITALS — BP 124/78 | HR 92 | Temp 97.9°F | Wt 222.0 lb

## 2013-02-25 DIAGNOSIS — R062 Wheezing: Secondary | ICD-10-CM

## 2013-02-25 MED ORDER — METHYLPREDNISOLONE ACETATE 80 MG/ML IJ SUSP
80.0000 mg | Freq: Once | INTRAMUSCULAR | Status: AC
  Administered 2013-02-25: 80 mg via INTRAMUSCULAR

## 2013-02-25 MED ORDER — ALBUTEROL SULFATE HFA 108 (90 BASE) MCG/ACT IN AERS: 2.0000 | INHALATION_SPRAY | RESPIRATORY_TRACT | Status: DC | PRN

## 2013-02-25 NOTE — Patient Instructions (Addendum)
Pulmicort 2 puffs twice daily for one week then one puff twice daily Rinse mouth after use to prevent thrush.

## 2013-02-25 NOTE — Progress Notes (Signed)
  Subjective:    Patient ID: Lauren Campos, female    DOB: March 25, 1956, 57 y.o.   MRN: [id number]  HPI Patient seen with cough and wheeze intermittently since August. She states that she had problem with leakage in her house and has confirmed mold but has not been able to afford getting this fixed yet She's had substantial wheezing and cough since that time. She does smoke. She had chest x-ray back in February of this year which did not reveal any acute abnormalities. Cough is mostly dry. Cough and wheezing worse at night. No hemoptysis. No appetite or weight changes.  Last visit she was given Depo-Medrol and albuterol and did respond to that briefly. No recent fever. No postnasal drip. No sinus congestion.  Past Medical History  Diagnosis Date  .   07/20/2006  . BACK PAIN, LOW 07/20/2006  . BREAST LUMP 09/05/2007  . CONSTIPATION, CHRONIC 08/10/2009  . DIVERTICULOSIS, COLON 08/10/2009  . Hemoptysis 08/27/2008  . HYPERLIPIDEMIA 05/08/2007  . HYPERLIPIDEMIA 07/20/2006  . INSOMNIA NOS 07/20/2006  . 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 MIGRAINE, COMMON 05/08/2007  . OBESITY, NOS 07/20/2006   Past Surgical History  Procedure Laterality Date  . Tubal ligation    . Abdominal hysterectomy    . Laparoscopic gastric banding      reports that she has been smoking Cigarettes.  She has been smoking about 0.50 packs per day. She has never used smokeless tobacco. She reports that she does not drink alcohol or use illicit drugs. family history includes Cancer in her father and mother; Diabetes in her mother and sister; Heart disease in her mother. No Known Allergies    Review of Systems  Constitutional: Negative for fever, chills and unexpected weight change.  Respiratory: Positive for cough, shortness of breath and wheezing.   Cardiovascular: Negative for chest pain.  Neurological: Negative for dizziness and headaches.       Objective:   Physical Exam  Constitutional: She appears well-developed and  well-nourished.  HENT:  Right Ear: External ear normal.  Left Ear: External ear normal.  Mouth/Throat: Oropharynx is clear and moist.  Neck: Neck supple. No thyromegaly present.  Cardiovascular: Normal rate.   Pulmonary/Chest: She has wheezes.  Patient has diffuse wheezes. No rales. No retractions.  Musculoskeletal: She exhibits no edema.          Assessment & Plan:  Cough and wheezing. Patient reports possible mold issues in house. Refill albuterol for as needed use. Depo-Medrol 80 mg IM given. Samples of Pulmicort 2 puffs twice daily for one week then one puff twice daily. Rinse mouth after use. Followup with primary in 2-3 weeks if cough not tremendously improved.

## 2013-02-27 ENCOUNTER — Emergency Department (HOSPITAL_COMMUNITY)
Admission: EM | Admit: 2013-02-27 | Discharge: 2013-02-27 | Disposition: A | Discharge status: Home / Self Care | Payer: Self-pay | Attending: Emergency Medicine | Admitting: Emergency Medicine

## 2013-02-27 ENCOUNTER — Emergency Department (HOSPITAL_COMMUNITY): Payer: Self-pay

## 2013-02-27 ENCOUNTER — Encounter (HOSPITAL_COMMUNITY): Payer: Self-pay | Admitting: Emergency Medicine

## 2013-02-27 ENCOUNTER — Telehealth: Payer: Self-pay | Admitting: Internal Medicine

## 2013-02-27 DIAGNOSIS — Z79899 Other long term (current) drug therapy: Secondary | ICD-10-CM | POA: Insufficient documentation

## 2013-02-27 DIAGNOSIS — J159 Unspecified bacterial pneumonia: Secondary | ICD-10-CM | POA: Insufficient documentation

## 2013-02-27 DIAGNOSIS — J189 Pneumonia, unspecified organism: Secondary | ICD-10-CM

## 2013-02-27 DIAGNOSIS — Z8742 Personal history of other diseases of the female genital tract: Secondary | ICD-10-CM | POA: Insufficient documentation

## 2013-02-27 DIAGNOSIS — F17200 Nicotine dependence, unspecified, uncomplicated: Secondary | ICD-10-CM | POA: Insufficient documentation

## 2013-02-27 DIAGNOSIS — R0602 Shortness of breath: Secondary | ICD-10-CM

## 2013-02-27 DIAGNOSIS — E669 Obesity, unspecified: Secondary | ICD-10-CM | POA: Insufficient documentation

## 2013-02-27 DIAGNOSIS — Z8719 Personal history of other diseases of the digestive system: Secondary | ICD-10-CM | POA: Insufficient documentation

## 2013-02-27 DIAGNOSIS — G43009 Migraine without aura, not intractable, without status migrainosus: Secondary | ICD-10-CM | POA: Insufficient documentation

## 2013-02-27 DIAGNOSIS — G4700 Insomnia, unspecified: Secondary | ICD-10-CM | POA: Insufficient documentation

## 2013-02-27 DIAGNOSIS — R05 Cough: Secondary | ICD-10-CM

## 2013-02-27 MED ORDER — PREDNISONE 10 MG PO TABS: 40.0000 mg | ORAL_TABLET | Freq: Every day | ORAL | Status: DC

## 2013-02-27 MED ORDER — AZITHROMYCIN 250 MG PO TABS: 250.0000 mg | ORAL_TABLET | Freq: Every day | ORAL | Status: DC

## 2013-02-27 MED ORDER — AZITHROMYCIN 500 MG IV SOLR
500.0000 mg | Freq: Once | INTRAVENOUS | Status: AC
  Administered 2013-02-27: 500 mg via INTRAVENOUS

## 2013-02-27 MED ORDER — ALBUTEROL SULFATE (5 MG/ML) 0.5% IN NEBU
2.5000 mg | INHALATION_SOLUTION | RESPIRATORY_TRACT | Status: AC
  Administered 2013-02-27 (×3): 2.5 mg via RESPIRATORY_TRACT
  Filled 2013-02-27 (×3): qty 0.5

## 2013-02-27 MED ORDER — DEXTROSE 5 % IV SOLN
1.0000 g | Freq: Once | INTRAVENOUS | Status: AC
  Administered 2013-02-27: 1 g via INTRAVENOUS
  Filled 2013-02-27: qty 10

## 2013-02-27 MED ORDER — ALBUTEROL SULFATE HFA 108 (90 BASE) MCG/ACT IN AERS: 1.0000 | INHALATION_SPRAY | Freq: Four times a day (QID) | RESPIRATORY_TRACT | Status: DC | PRN

## 2013-02-27 MED ORDER — IPRATROPIUM BROMIDE 0.02 % IN SOLN
0.5000 mg | RESPIRATORY_TRACT | Status: AC
  Administered 2013-02-27 (×3): 0.5 mg via RESPIRATORY_TRACT
  Filled 2013-02-27 (×3): qty 2.5

## 2013-02-27 MED ORDER — PREDNISONE 20 MG PO TABS
60.0000 mg | ORAL_TABLET | Freq: Once | ORAL | Status: AC
  Administered 2013-02-27: 60 mg via ORAL
  Filled 2013-02-27 (×2): qty 3

## 2013-02-27 NOTE — Telephone Encounter (Signed)
Ok to set up CXR. i will order

## 2013-02-27 NOTE — ED Notes (Signed)
Seen by PCP given [facility] has not had RX filled- Pt reports increase in weakness and shortness in breath

## 2013-02-27 NOTE — Telephone Encounter (Signed)
Pt saw Dr Caryl Never yesterday. Pt would like to have chest xray today

## 2013-02-27 NOTE — Telephone Encounter (Signed)
Patient to [facility] and they did a chest xray on her, and it comes back that she has pneumonia.

## 2013-02-27 NOTE — Progress Notes (Signed)
[facility] provided pt with a list of primary care resources and a [facility] [employer] [facility] application.  °
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
## 2013-02-27 NOTE — Telephone Encounter (Signed)
Left message for patient to return call.

## 2013-02-27 NOTE — Telephone Encounter (Signed)
Pt would like to do a chest xray due to the mold in her home.

## 2013-02-27 NOTE — ED Provider Notes (Signed)
CSN: [id number]     Arrival date & time 02/27/13  1125 History   First MD Initiated Contact with Patient 02/27/13 1138     Chief Complaint  Patient presents with  . Shortness of Breath   (Consider location/radiation/quality/duration/timing/severity/associated sxs/prior Treatment) Patient is a 57 y.o. female presenting with shortness of breath. The history is provided by the patient.  Shortness of Breath Severity:  Mild Onset quality:  Gradual Duration:  2 weeks Timing:  Constant Progression:  Worsening Chronicity:  New Context comment:  Mold exposure Relieved by:  Inhaler (leaving her house) Worsened by:  Nothing tried Ineffective treatments:  None tried Associated symptoms: cough and wheezing   Associated symptoms: no fever, no rash and no vomiting     Past Medical History  Diagnosis Date  .   07/20/2006  . BACK PAIN, LOW 07/20/2006  . BREAST LUMP 09/05/2007  . CONSTIPATION, CHRONIC 08/10/2009  . DIVERTICULOSIS, COLON 08/10/2009  . Hemoptysis 08/27/2008  . HYPERLIPIDEMIA 05/08/2007  . HYPERLIPIDEMIA 07/20/2006  . INSOMNIA NOS 07/20/2006  . 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 MIGRAINE, COMMON 05/08/2007  . OBESITY, NOS 07/20/2006   Past Surgical History  Procedure Laterality Date  . Tubal ligation    . Abdominal hysterectomy    . Laparoscopic gastric banding     Family History  Problem Relation Age of Onset  . Cancer Mother   . Diabetes Mother   . Heart disease Mother   . Cancer Father   . Diabetes Sister    History  Substance Use Topics  . Smoking status: Current Every Day Smoker -- 0.50 packs/day    Types: Cigarettes  . Smokeless tobacco: Never Used  . Alcohol Use: No   OB History   Grav Para Term Preterm Abortions TAB SAB Ect Mult Living                 Review of Systems  Constitutional: Negative for fever.  Respiratory: Positive for cough, shortness of breath and wheezing.   Gastrointestinal: Negative for vomiting.  Skin: Negative for rash.  All other systems  reviewed and are negative.    Allergies  Review of patient's allergies indicates no known allergies.  Home Medications   Current Outpatient Rx  Name  Route  Sig  Dispense  Refill  . amitriptyline (ELAVIL) 100 MG tablet   Oral   Take 100 mg by mouth at bedtime.         Marland Kitchen albuterol (PROVENTIL HFA;VENTOLIN HFA) 108 (90 BASE) MCG/ACT inhaler   Inhalation   Inhale 2 puffs into the lungs every 4 (four) hours as needed for wheezing.   1 Inhaler   1    BP 115/73  Pulse 89  Temp(Src) 98.2 F (36.8 C) (Oral)  Resp 20  SpO2 95% Physical Exam  Nursing note and vitals reviewed. Constitutional: She is oriented to person, place, and time. She appears well-developed and well-nourished. No distress.  HENT:  Head: Normocephalic and atraumatic.  Eyes: EOM are normal. Pupils are equal, round, and reactive to light.  Neck: Normal range of motion. Neck supple.  Cardiovascular: Normal rate and regular rhythm.  Exam reveals no friction rub.   No murmur heard. Pulmonary/Chest: Effort normal. No respiratory distress. She has wheezes (diffuse). She has no rales.  Abdominal: Soft. She exhibits no distension. There is no tenderness. There is no rebound.  Musculoskeletal: Normal range of motion. She exhibits no edema.  Neurological: She is alert and oriented to person, place, and time.  Skin: No rash  noted. She is not diaphoretic.    ED Course  Procedures (including critical care time) Labs Review Labs Reviewed - No data to display Imaging Review No results found.  \ Date: 02/27/2013  Rate: 74  Rhythm: normal sinus rhythm  QRS Axis: normal  Intervals: normal  ST/T Wave abnormalities: normal  Conduction Disutrbances:none  Narrative Interpretation:   Old EKG Reviewed: unchanged   MDM   1. Community acquired pneumonia   2. Shortness of breath    57 year old female presents with shortness of breath. A single #2 weeks and getting worse. She seen by her doctor yesterday who gave her  an albuterol inhaler that  provided mild relief. She has no history of asthma or COPD. She is a smoker. She is not currently on any estrogen therapy. She states she is exposed to black mold at her house. She feels better when she is out of the house. And now she feels worse. She occasionally gets dizzy and nauseated. No vomiting. No chest pain with this. Here vitals show no tachycardia, normotensive. Patient is satting between 89 and 94-95% on room air. Given option to help the symptoms. On exam, has diffuse wheezing throughout her entire lung fields. No retractions and no acute respiratory distress. We will give patient doing meds and steroids. We'll also perform a chest x-ray. CXR shows RUL pneumonia. Rocephin, azithro given. Will be able to send home without hypoxia. Will allow her time to get breathing treatments. Sent home with Azithromycin. Instructed to f/u with PCP. Feeling better after duonebs.  Dagmar Hait, MD 02/27/13 [phone]

## 2013-02-27 NOTE — ED Notes (Signed)
Pt escorted to discharge window. Pt verbalized understanding discharge instructions. In no acute distress.  

## 2013-02-27 NOTE — Telephone Encounter (Signed)
ok 

## 2013-02-28 ENCOUNTER — Emergency Department (HOSPITAL_COMMUNITY)
Admission: EM | Admit: 2013-02-28 | Discharge: 2013-02-28 | Disposition: A | Discharge status: Home / Self Care | Payer: Self-pay | Attending: Emergency Medicine | Admitting: Emergency Medicine

## 2013-02-28 ENCOUNTER — Encounter (HOSPITAL_COMMUNITY): Payer: Self-pay | Admitting: Emergency Medicine

## 2013-02-28 DIAGNOSIS — R062 Wheezing: Secondary | ICD-10-CM | POA: Insufficient documentation

## 2013-02-28 DIAGNOSIS — F17200 Nicotine dependence, unspecified, uncomplicated: Secondary | ICD-10-CM | POA: Insufficient documentation

## 2013-02-28 DIAGNOSIS — R05 Cough: Secondary | ICD-10-CM | POA: Insufficient documentation

## 2013-02-28 DIAGNOSIS — Z8742 Personal history of other diseases of the female genital tract: Secondary | ICD-10-CM | POA: Insufficient documentation

## 2013-02-28 DIAGNOSIS — E785 Hyperlipidemia, unspecified: Secondary | ICD-10-CM | POA: Insufficient documentation

## 2013-02-28 DIAGNOSIS — R079 Chest pain, unspecified: Secondary | ICD-10-CM

## 2013-02-28 DIAGNOSIS — R059 Cough, unspecified: Secondary | ICD-10-CM | POA: Insufficient documentation

## 2013-02-28 DIAGNOSIS — Z8669 Personal history of other diseases of the nervous system and sense organs: Secondary | ICD-10-CM | POA: Insufficient documentation

## 2013-02-28 DIAGNOSIS — Z79899 Other long term (current) drug therapy: Secondary | ICD-10-CM | POA: Insufficient documentation

## 2013-02-28 DIAGNOSIS — Z8739 Personal history of other diseases of the musculoskeletal system and connective tissue: Secondary | ICD-10-CM | POA: Insufficient documentation

## 2013-02-28 DIAGNOSIS — Z8719 Personal history of other diseases of the digestive system: Secondary | ICD-10-CM | POA: Insufficient documentation

## 2013-02-28 DIAGNOSIS — E669 Obesity, unspecified: Secondary | ICD-10-CM | POA: Insufficient documentation

## 2013-02-28 DIAGNOSIS — R071 Chest pain on breathing: Secondary | ICD-10-CM | POA: Insufficient documentation

## 2013-02-28 DIAGNOSIS — J189 Pneumonia, unspecified organism: Secondary | ICD-10-CM | POA: Insufficient documentation

## 2013-02-28 LAB — POCT I-STAT TROPONIN I: Troponin i, poc: 0 ng/mL (ref 0.00–0.08)

## 2013-02-28 MED ORDER — AZITHROMYCIN 250 MG PO TABS
250.0000 mg | ORAL_TABLET | Freq: Once | ORAL | Status: AC
  Administered 2013-02-28: 250 mg via ORAL
  Filled 2013-02-28: qty 1

## 2013-02-28 MED ORDER — BENZONATATE 100 MG PO CAPS
100.0000 mg | ORAL_CAPSULE | Freq: Once | ORAL | Status: AC
  Administered 2013-02-28: 100 mg via ORAL
  Filled 2013-02-28: qty 1

## 2013-02-28 NOTE — ED Provider Notes (Signed)
CSN: [id number]     Arrival date & time 02/28/13  1054 History   First MD Initiated Contact with Patient 02/28/13 1101     Chief Complaint  Patient presents with  . Chest Pain   (Consider location/radiation/quality/duration/timing/severity/associated sxs/prior Treatment) Patient is a 57 y.o. female presenting with chest pain. The history is provided by the patient.  Chest Pain Pain location:  R chest Pain quality: sharp   Radiates to: R lateral chest. Pain radiates to the back: no   Pain severity:  Mild Onset quality:  Sudden Timing:  Constant Progression:  Unchanged Chronicity:  New Context: not breathing, no drug use, no intercourse, no stress and no trauma   Relieved by:  Nothing Worsened by:  Nothing tried Associated symptoms: cough (diagnosed with Pneumonia yesterday)   Associated symptoms: no abdominal pain, no dizziness, no fatigue, no fever and not vomiting     Past Medical History  Diagnosis Date  .   07/20/2006  . BACK PAIN, LOW 07/20/2006  . BREAST LUMP 09/05/2007  . CONSTIPATION, CHRONIC 08/10/2009  . DIVERTICULOSIS, COLON 08/10/2009  . Hemoptysis 08/27/2008  . HYPERLIPIDEMIA 05/08/2007  . HYPERLIPIDEMIA 07/20/2006  . INSOMNIA NOS 07/20/2006  . 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 MIGRAINE, COMMON 05/08/2007  . OBESITY, NOS 07/20/2006   Past Surgical History  Procedure Laterality Date  . Tubal ligation    . Abdominal hysterectomy    . Laparoscopic gastric banding     Family History  Problem Relation Age of Onset  . Cancer Mother   . Diabetes Mother   . Heart disease Mother   . Cancer Father   . Diabetes Sister    History  Substance Use Topics  . Smoking status: Current Every Day Smoker -- 0.50 packs/day    Types: Cigarettes  . Smokeless tobacco: Never Used  . Alcohol Use: No   OB History   Grav Para Term Preterm Abortions TAB SAB Ect Mult Living                 Review of Systems  Constitutional: Negative for fever and fatigue.  Respiratory: Positive for cough  (diagnosed with Pneumonia yesterday).   Cardiovascular: Positive for chest pain.  Gastrointestinal: Negative for vomiting and abdominal pain.  Neurological: Negative for dizziness.  All other systems reviewed and are negative.    Allergies  Review of patient's allergies indicates no known allergies.  Home Medications   Current Outpatient Rx  Name  Route  Sig  Dispense  Refill  . amitriptyline (ELAVIL) 100 MG tablet   Oral   Take 100 mg by mouth at bedtime.         Marland Kitchen albuterol (PROVENTIL HFA;VENTOLIN HFA) 108 (90 BASE) MCG/ACT inhaler   Inhalation   Inhale 1-2 puffs into the lungs every 6 (six) hours as needed for wheezing.   1 Inhaler   0   . azithromycin (ZITHROMAX) 250 MG tablet   Oral   Take 1 tablet (250 mg total) by mouth daily. Take first 2 tablets together, then 1 every day until finished.   4 tablet   0   . predniSONE (DELTASONE) 10 MG tablet   Oral   Take 4 tablets (40 mg total) by mouth daily.   16 tablet   0    BP 124/75  Pulse 80  Temp(Src) 97.7 F (36.5 C) (Oral)  Resp 14  SpO2 95% Physical Exam  Nursing note and vitals reviewed. Constitutional: She is oriented to person, place, and time. She appears well-developed  and well-nourished. No distress.  HENT:  Head: Normocephalic and atraumatic.  Eyes: EOM are normal. Pupils are equal, round, and reactive to light.  Neck: Normal range of motion. Neck supple.  Cardiovascular: Normal rate and regular rhythm.  Exam reveals no friction rub.   No murmur heard. Pulmonary/Chest: Effort normal. No respiratory distress. She has wheezes (mild R middle lobe). She has no rales.  Abdominal: Soft. She exhibits no distension. There is no tenderness. There is no rebound.  Musculoskeletal: Normal range of motion. She exhibits no edema.  Neurological: She is alert and oriented to person, place, and time.  Skin: She is not diaphoretic.    ED Course  Procedures (including critical care time) Labs Review Labs  Reviewed - No data to display Imaging Review Dg Chest 2 View  02/27/2013   CLINICAL DATA:  Shortness of breath, chest congestion, cough  EXAM: CHEST  2 VIEW  COMPARISON:  07/13/2012  FINDINGS: Increased interstitial markings. Mild patchy right upper lobe opacity, suspicious for pneumonia. No pleural effusion or pneumothorax.  The heart is normal in size.  Mild degenerative changes of the visualized thoracolumbar spine.  IMPRESSION: Mild patchy right upper lobe opacity, suspicious for pneumonia.   Electronically Signed   By: Charline Bills M.D.   On: 02/27/2013 12:47    EKG Interpretation   None       Date: 02/28/2013  Rate: 82  Rhythm: normal sinus rhythm  QRS Axis: normal  Intervals: normal  ST/T Wave abnormalities: normal  Conduction Disutrbances:none  Narrative Interpretation:   Old EKG Reviewed: unchanged   MDM   1. Chest pain    2F presents with R sided chest pain. Patient diagnosed with PNA yesterday by me, right upper lobe. Did not fill prescriptions. No SOB, hemoptysis, just R sided chest pain that radiates to R lateral chest. Began while driving, pain concerned she is having a heart attack. No prior history of CAD. AFVSS here. Lungs clear except for mild wheeze in RML. No chest tenderness. Initial EKG sinus rhythm, no changes from yesterday. Will rule out ACS with troponins. Has known pneumonia and has been coughing, this is likely the source of the pain. Patient's clinical picture does not seem like PE.  Troponins normal. Stable for discharge. Instructed to f/u with PCP.     Dagmar Hait, MD 02/28/13 1515

## 2013-02-28 NOTE — ED Notes (Signed)
Pt c/o intermittent central chest and rib cage pain starting this morning.  Pain score 2/10.  Pt was seen at [facility] yesterday and diagnosed with PNA.  Sts " I have been coughing a lot and did not fill my prescriptions."

## 2013-02-28 NOTE — Progress Notes (Signed)
[facility] spoke with patient about [facility] [employer] yesterday. Today, spoke with patient to see if she had a chance to look over paperwork and if she had any questions.

## 2013-03-13 ENCOUNTER — Ambulatory Visit: Payer: Self-pay | Admitting: Family Medicine

## 2013-03-21 ENCOUNTER — Ambulatory Visit (INDEPENDENT_AMBULATORY_CARE_PROVIDER_SITE_OTHER): Payer: Self-pay | Admitting: Internal Medicine

## 2013-03-21 ENCOUNTER — Encounter: Payer: Self-pay | Admitting: Internal Medicine

## 2013-03-21 VITALS — BP 140/84 | HR 87 | Temp 98.0°F | Resp 20 | Wt 226.0 lb

## 2013-03-21 DIAGNOSIS — B349 Viral infection, unspecified: Secondary | ICD-10-CM

## 2013-03-21 DIAGNOSIS — B9789 Other viral agents as the cause of diseases classified elsewhere: Secondary | ICD-10-CM

## 2013-03-21 DIAGNOSIS — E669 Obesity, unspecified: Secondary | ICD-10-CM

## 2013-03-21 DIAGNOSIS — F17200 Nicotine dependence, unspecified, uncomplicated: Secondary | ICD-10-CM

## 2013-03-21 MED ORDER — PHENTERMINE HCL 37.5 MG PO CAPS: 37.5000 mg | ORAL_CAPSULE | ORAL | Status: DC

## 2013-03-21 NOTE — Progress Notes (Signed)
Subjective:    Patient ID: Lauren Campos, female    DOB: [birth year], 57 y.o.   MRN: [id number]  HPI  57 year old patient who was seen in the ED 3 weeks ago she was treated for probable right upper lobe pneumonia.  She completed she states it 2 separate antibiotics. She does have a heavy bottle of azithromycin. She states she initially improved but over the past couple days has had mild increased cough and a general sense of unwellness. She also has some diabetic concerns due to frequent urination and excessive thirst. She continues to smoke.  Denies any productive cough or fever.  Past Medical History  Diagnosis Date  .   07/20/2006  . BACK PAIN, LOW 07/20/2006  . BREAST LUMP 09/05/2007  . CONSTIPATION, CHRONIC 08/10/2009  . DIVERTICULOSIS, COLON 08/10/2009  . Hemoptysis 08/27/2008  . HYPERLIPIDEMIA 05/08/2007  . HYPERLIPIDEMIA 07/20/2006  . INSOMNIA NOS 07/20/2006  . 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 MIGRAINE, COMMON 05/08/2007  . OBESITY, NOS 07/20/2006    History   Social History  . Marital Status: Single    Spouse Name: N/A    Number of Children: N/A  . Years of Education: N/A   Occupational History  . Not on file.   Social History Main Topics  . Smoking status: Current Every Day Smoker -- 0.50 packs/day    Types: Cigarettes  . Smokeless tobacco: Never Used  . Alcohol Use: No  . Drug Use: No  . Sexual Activity: Not on file   Other Topics Concern  . Not on file   Social History Narrative  . No narrative on file    Past Surgical History  Procedure Laterality Date  . Tubal ligation    . Abdominal hysterectomy    . Laparoscopic gastric banding      Family History  Problem Relation Age of Onset  . Cancer Mother   . Diabetes Mother   . Heart disease Mother   . Cancer Father   . Diabetes Sister     No Known Allergies  Current Outpatient Prescriptions on File Prior to Visit  Medication Sig Dispense Refill  . albuterol (PROVENTIL HFA;VENTOLIN HFA) 108 (90 BASE)  MCG/ACT inhaler Inhale 1-2 puffs into the lungs every 6 (six) hours as needed for wheezing.  1 Inhaler  0  . amitriptyline (ELAVIL) 100 MG tablet Take 100 mg by mouth at bedtime.       No current facility-administered medications on file prior to visit.    BP 140/84  Pulse 87  Temp(Src) 98 F (36.7 C) (Oral)  Resp 20  Wt 226 lb (102.513 kg)  BMI 42.72 kg/m2  SpO2 97%       Review of Systems  Constitutional: Positive for appetite change and fatigue. Negative for fever.  HENT: Negative for congestion, dental problem, hearing loss, rhinorrhea, sinus pressure, sore throat and tinnitus.   Eyes: Negative for pain, discharge and visual disturbance.  Respiratory: Positive for cough. Negative for shortness of breath.   Cardiovascular: Negative for chest pain, palpitations and leg swelling.  Gastrointestinal: Negative for nausea, vomiting, abdominal pain, diarrhea, constipation, blood in stool and abdominal distention.  Genitourinary: Negative for dysuria, urgency, frequency, hematuria, flank pain, vaginal bleeding, vaginal discharge, difficulty urinating, vaginal pain and pelvic pain.  Musculoskeletal: Negative for arthralgias, gait problem and joint swelling.  Skin: Negative for rash.  Neurological: Negative for dizziness, syncope, speech difficulty, weakness, numbness and headaches.  Hematological: Negative for adenopathy.  Psychiatric/Behavioral: Negative for behavioral problems, dysphoric mood and  agitation. The patient is not nervous/anxious.        Objective:   Physical Exam  Constitutional: She is oriented to person, place, and time. She appears well-developed and well-nourished.  HENT:  Head: Normocephalic.  Right Ear: External ear normal.  Left Ear: External ear normal.  Mouth/Throat: Oropharynx is clear and moist.  Eyes: Conjunctivae and EOM are normal. Pupils are equal, round, and reactive to light.  Neck: Normal range of motion. Neck supple. No thyromegaly present.   Cardiovascular: Normal rate, regular rhythm, normal heart sounds and intact distal pulses.   Pulmonary/Chest: Effort normal and breath sounds normal. No respiratory distress. She has no wheezes. She has no rales.  Abdominal: Soft. Bowel sounds are normal. She exhibits no mass. There is no tenderness.  Musculoskeletal: Normal range of motion.  Lymphadenopathy:    She has no cervical adenopathy.  Neurological: She is alert and oriented to person, place, and time.  Skin: Skin is warm and dry. No rash noted.  Psychiatric: She has a normal mood and affect. Her behavior is normal.          Assessment & Plan:   Mild cough in a patient with history of recent pneumonia and ongoing tobacco use. Patient has no fever and clear lung fields. Suspect early viral syndrome. Total smoking cessation encouraged. We'll treat with Mucinex DM and observe Exogenous obesity Ongoing tobacco use  Rule out diabetes mellitus. Her random blood sugar was normal

## 2013-03-21 NOTE — Patient Instructions (Signed)
Acute bronchitis symptoms for less than 10 days are generally not helped by antibiotics.  Take over-the-counter expectorants and cough medications such as  Mucinex DM.  Call if there is no improvement in 5 to 7 days or if he developed worsening cough, fever, or new symptoms, such as shortness of breath or chest pain.  Smoking tobacco is very bad for your health. You should stop smoking immediately.  You need to lose weight.  Consider a lower calorie diet and regular exercise.

## 2013-03-22 ENCOUNTER — Telehealth: Payer: Self-pay | Admitting: Internal Medicine

## 2013-03-22 ENCOUNTER — Encounter: Payer: Self-pay | Admitting: *Deleted

## 2013-03-22 NOTE — Telephone Encounter (Signed)
ok 

## 2013-03-22 NOTE — Telephone Encounter (Signed)
Letter faxed to number given and patient is aware

## 2013-03-22 NOTE — Telephone Encounter (Signed)
Pt states she was seen in ED on 10/8 & 10/9 and dx with pnuemonia.  She was seen by PCP yesterday for ED followup and pt states today she feels really bad, the same that she felt earlier this month when she was first diagnosed.  Pt is requesting to have note written to be out of work this weekend(Saturday & Sunday).  Please fax to employer at [phone] Attn: Olegario Messier.  Please notify pt if/when this has been done.

## 2013-04-04 ENCOUNTER — Emergency Department (HOSPITAL_COMMUNITY)
Admission: EM | Admit: 2013-04-04 | Discharge: 2013-04-04 | Disposition: A | Discharge status: Home / Self Care | Payer: Self-pay | Attending: Emergency Medicine | Admitting: Emergency Medicine

## 2013-04-04 ENCOUNTER — Emergency Department (HOSPITAL_COMMUNITY): Payer: Self-pay

## 2013-04-04 ENCOUNTER — Encounter (HOSPITAL_COMMUNITY): Payer: Self-pay | Admitting: Emergency Medicine

## 2013-04-04 DIAGNOSIS — R05 Cough: Secondary | ICD-10-CM | POA: Insufficient documentation

## 2013-04-04 DIAGNOSIS — Z8679 Personal history of other diseases of the circulatory system: Secondary | ICD-10-CM | POA: Insufficient documentation

## 2013-04-04 DIAGNOSIS — F17200 Nicotine dependence, unspecified, uncomplicated: Secondary | ICD-10-CM | POA: Insufficient documentation

## 2013-04-04 DIAGNOSIS — R079 Chest pain, unspecified: Secondary | ICD-10-CM | POA: Insufficient documentation

## 2013-04-04 DIAGNOSIS — Z8742 Personal history of other diseases of the female genital tract: Secondary | ICD-10-CM | POA: Insufficient documentation

## 2013-04-04 DIAGNOSIS — Z8719 Personal history of other diseases of the digestive system: Secondary | ICD-10-CM | POA: Insufficient documentation

## 2013-04-04 DIAGNOSIS — J029 Acute pharyngitis, unspecified: Secondary | ICD-10-CM | POA: Insufficient documentation

## 2013-04-04 DIAGNOSIS — E669 Obesity, unspecified: Secondary | ICD-10-CM | POA: Insufficient documentation

## 2013-04-04 DIAGNOSIS — Z8701 Personal history of pneumonia (recurrent): Secondary | ICD-10-CM | POA: Insufficient documentation

## 2013-04-04 DIAGNOSIS — R059 Cough, unspecified: Secondary | ICD-10-CM | POA: Insufficient documentation

## 2013-04-04 HISTORY — DX: Pneumonia, unspecified organism: J18.9

## 2013-04-04 LAB — POCT I-STAT TROPONIN I: Troponin i, poc: 0 ng/mL (ref 0.00–0.08)

## 2013-04-04 MED ORDER — BENZONATATE 100 MG PO CAPS
100.0000 mg | ORAL_CAPSULE | Freq: Once | ORAL | Status: AC
  Administered 2013-04-04: 100 mg via ORAL
  Filled 2013-04-04: qty 1

## 2013-04-04 NOTE — ED Provider Notes (Signed)
CSN: [id number]     Arrival date & time 04/04/13  1426 History   First MD Initiated Contact with Patient 04/04/13 1459     Chief Complaint  Patient presents with  . Cough   (Consider location/radiation/quality/duration/timing/severity/associated sxs/prior Treatment) Patient is a 57 y.o. female presenting with cough. The history is provided by the patient.  Cough Cough characteristics:  Non-productive Severity:  Moderate Onset quality:  Gradual Duration:  2 days Timing:  Constant Progression:  Unchanged Chronicity:  New Smoker: no   Context: not exposure to allergens, not sick contacts and not smoke exposure   Relieved by:  Nothing Worsened by:  Nothing tried Ineffective treatments:  None tried Associated symptoms: chest pain (constant, central heaviness), rhinorrhea, sinus congestion and sore throat   Associated symptoms: no chills, no fever, no rash, no shortness of breath and no wheezing     Past Medical History  Diagnosis Date  .   07/20/2006  . BACK PAIN, LOW 07/20/2006  . BREAST LUMP 09/05/2007  . CONSTIPATION, CHRONIC 08/10/2009  . DIVERTICULOSIS, COLON 08/10/2009  . Hemoptysis 08/27/2008  . HYPERLIPIDEMIA 05/08/2007  . HYPERLIPIDEMIA 07/20/2006  . INSOMNIA NOS 07/20/2006  . 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 MIGRAINE, COMMON 05/08/2007  . OBESITY, NOS 07/20/2006  . Pneumonia    Past Surgical History  Procedure Laterality Date  . Tubal ligation    . Abdominal hysterectomy    . Laparoscopic gastric banding     Family History  Problem Relation Age of Onset  . Cancer Mother   . Diabetes Mother   . Heart disease Mother   . Cancer Father   . Diabetes Sister    History  Substance Use Topics  . Smoking status: Current Every Day Smoker -- 0.50 packs/day    Types: Cigarettes  . Smokeless tobacco: Never Used  . Alcohol Use: No   OB History   Grav Para Term Preterm Abortions TAB SAB Ect Mult Living                 Review of Systems  Constitutional: Negative for fever and  chills.  HENT: Positive for rhinorrhea and sore throat.   Respiratory: Positive for cough (non-productive). Negative for shortness of breath and wheezing.   Cardiovascular: Positive for chest pain (constant, central heaviness).  Gastrointestinal: Negative for vomiting and abdominal pain.  Skin: Negative for rash.  All other systems reviewed and are negative.    Allergies  Review of patient's allergies indicates no known allergies.  Home Medications   Current Outpatient Rx  Name  Route  Sig  Dispense  Refill  . albuterol (PROVENTIL HFA;VENTOLIN HFA) 108 (90 BASE) MCG/ACT inhaler   Inhalation   Inhale 1-2 puffs into the lungs every 6 (six) hours as needed for wheezing.   1 Inhaler   0   . amitriptyline (ELAVIL) 100 MG tablet   Oral   Take 100 mg by mouth at bedtime.         . phentermine 37.5 MG capsule   Oral   Take 1 capsule (37.5 mg total) by mouth every morning.   90 capsule   0    BP 141/73  Pulse 94  Temp(Src) 98.7 F (37.1 C) (Oral)  Resp 20  SpO2 98% Physical Exam  Nursing note and vitals reviewed. Constitutional: She is oriented to person, place, and time. She appears well-developed and well-nourished. No distress.  HENT:  Head: Normocephalic and atraumatic.  Eyes: EOM are normal. Pupils are equal, round, and reactive to light.  Neck: Normal range of motion. Neck supple.  Cardiovascular: Normal rate and regular rhythm.  Exam reveals no friction rub.   No murmur heard. Pulmonary/Chest: Effort normal and breath sounds normal. No respiratory distress. She has no wheezes. She has no rales.  Abdominal: Soft. She exhibits no distension. There is no tenderness. There is no rebound.  Musculoskeletal: Normal range of motion. She exhibits no edema.  Neurological: She is alert and oriented to person, place, and time.  Skin: No rash noted. She is not diaphoretic. No pallor.    ED Course  Procedures (including critical care time) Labs Review Labs Reviewed  POCT  I-STAT TROPONIN I   Imaging Review Dg Chest 2 View  04/04/2013   CLINICAL DATA:  Short of breath. Chest pain. Cough and congestion. History of pneumonia 1 month ago.  EXAM: CHEST  2 VIEW  COMPARISON:  02/27/2013  FINDINGS: Lateral view degraded by patient arm position. Midline trachea. Mild cardiomegaly with tortuous thoracic aorta. No pleural effusion or pneumothorax. Diffuse peribronchial thickening. Cannot exclude a subtle 8 mm left upper lobe lung nodule.  IMPRESSION: 1. No acute cardiopulmonary disease. Mild peribronchial thickening which may relate to chronic bronchitis or smoking. 2. Cannot exclude subtle left upper lobe pulmonary nodule. Consider further evaluation with chest CT. These results were called by telephone at the time of interpretation on 04/04/2013 at 3:28 PM to Dr. Marena Chancy , who verbally acknowledged these results.   Electronically Signed   By: Jeronimo Greaves M.D.   On: 04/04/2013 15:28   Ct Chest Wo Contrast  04/04/2013   CLINICAL DATA:  Nonproductive cough. Night sweats. Recent treatment for pneumonia.  EXAM: CT CHEST WITHOUT CONTRAST  TECHNIQUE: Multidetector CT imaging of the chest was performed following the standard protocol without IV contrast.  COMPARISON:  PA and lateral chest earlier this same day, 02/27/2013 hand 05/12/2009. CT chest 12/24/2003.  FINDINGS: There is no axillary, hilar or mediastinal lymphadenopathy. Pleural or pericardial effusion. Heart size is normal. The esophagus is mildly dilated with an air-fluid level. Lap band is noted. Lungs demonstrate fairly extensive centrilobular emphysematous disease. Small focus of micro nodularity is identified in the right lung apex most consistent with prior inflammatory change. No consolidative process is present. There is a left upper lobe nodule measuring 0.9 cm in diameter on image 14. A 0.4 cm left upper lobe nodule is seen image 19. The lungs are otherwise unremarkable. Incidentally imaged upper abdomen  demonstrates no focal abnormality. No focal bony abnormality is identified.  IMPRESSION: Negative for pneumonia.  0.9 cm left upper lobe nodule could be secondary to bronchogenic carcinoma. PET CT scan is recommended for further evaluation.  Mildly dilated esophagus with an air-fluid level may be secondary to the patient's lap band.   Electronically Signed   By: Drusilla Kanner M.D.   On: 04/04/2013 16:40    EKG Interpretation   None       Date: 04/04/2013  Rate: 86  Rhythm: normal sinus rhythm  QRS Axis: normal  Intervals: normal  ST/T Wave abnormalities: normal  Conduction Disutrbances:none  Narrative Interpretation:   Old EKG Reviewed: unchanged   MDM   1. Cough    34F with recent hx of pneumonia presents with chest heaviness and cough. She is concerned about another episode of pneumonia. No fever, no N/V/D. Constant chest heaviness for past 2 days. No alleviating/exacerbating. Non-productive cough. AFVSS here. Lungs clear, belly benign, HEENT exam normal.  Will check EKG, CXR, istat troponin.  Single troponin  ok as she's had constant chest pain. On CXR, concern for pulmonary nodule. CT ordered. CT shows possible bronchogenic carcinoma. I caught the patient trying to leave and informed her of these results. She is aware and will f/u with her doctor.    Dagmar Hait, MD 04/04/13 [phone]

## 2013-04-04 NOTE — ED Notes (Signed)
Patient transported to X-ray 

## 2013-04-04 NOTE — Progress Notes (Signed)
[facility] provided pt with a list of primary care resources and a [facility] [employer] [facility] application.  °

## 2013-04-04 NOTE — ED Notes (Signed)
Per pt was treated for pneumonia a month ago was feeling better and started feeling same symptoms on Tuesday. Pt reports nonproductive cough, night sweats, and congestion.

## 2013-04-04 NOTE — ED Notes (Signed)
Pt. Was trying to leave AMA when I brought her d/c papers. Patient agreed to sign but refused vitals.

## 2013-04-08 ENCOUNTER — Other Ambulatory Visit: Payer: Self-pay | Admitting: Internal Medicine

## 2013-04-08 DIAGNOSIS — R911 Solitary pulmonary nodule: Secondary | ICD-10-CM

## 2013-04-09 ENCOUNTER — Encounter: Payer: Self-pay | Admitting: Internal Medicine

## 2013-04-09 ENCOUNTER — Ambulatory Visit (INDEPENDENT_AMBULATORY_CARE_PROVIDER_SITE_OTHER): Payer: Self-pay | Admitting: Internal Medicine

## 2013-04-09 VITALS — BP 126/80 | HR 85 | Temp 98.0°F | Resp 20 | Wt 232.0 lb

## 2013-04-09 DIAGNOSIS — R911 Solitary pulmonary nodule: Secondary | ICD-10-CM

## 2013-04-09 MED ORDER — HYDROCODONE-HOMATROPINE 5-1.5 MG/5ML PO SYRP: 5.0000 mL | ORAL_SOLUTION | Freq: Four times a day (QID) | ORAL | Status: AC | PRN

## 2013-04-09 NOTE — Progress Notes (Signed)
Subjective:    Patient ID: Lauren Campos, female    DOB: 1955-08-13, 57 y.o.   MRN: [id number]  HPI  57 year old patient who was seen in the ED recently with a chief complaint of cough. Chest x-ray was suspicious for a left upper lobe nodule which was confirmed on CT scanning. CT scan revealed a 0.9 cm solid left upper lobe pulmonary nodule. The patient has smoked since age 57.  No health insurance at this time but she will be exploring options in 2 days  Past Medical History  Diagnosis Date  .   07/20/2006  . BACK PAIN, LOW 07/20/2006  . BREAST LUMP 09/05/2007  . CONSTIPATION, CHRONIC 08/10/2009  . DIVERTICULOSIS, COLON 08/10/2009  . Hemoptysis 08/27/2008  . HYPERLIPIDEMIA 05/08/2007  . HYPERLIPIDEMIA 07/20/2006  . INSOMNIA NOS 07/20/2006  . 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 MIGRAINE, COMMON 05/08/2007  . OBESITY, NOS 07/20/2006  . Pneumonia     History   Social History  . Marital Status: Single    Spouse Name: N/A    Number of Children: N/A  . Years of Education: N/A   Occupational History  . Not on file.   Social History Main Topics  . Smoking status: Current Every Day Smoker -- 0.50 packs/day    Types: Cigarettes  . Smokeless tobacco: Never Used  . Alcohol Use: No  . Drug Use: No  . Sexual Activity: Not on file   Other Topics Concern  . Not on file   Social History Narrative  . No narrative on file    Past Surgical History  Procedure Laterality Date  . Tubal ligation    . Abdominal hysterectomy    . Laparoscopic gastric banding      Family History  Problem Relation Age of Onset  . Cancer Mother   . Diabetes Mother   . Heart disease Mother   . Cancer Father   . Diabetes Sister     No Known Allergies  Current Outpatient Prescriptions on File Prior to Visit  Medication Sig Dispense Refill  . albuterol (PROVENTIL HFA;VENTOLIN HFA) 108 (90 BASE) MCG/ACT inhaler Inhale 1-2 puffs into the lungs every 6 (six) hours as needed for wheezing.  1 Inhaler  0  .  amitriptyline (ELAVIL) 100 MG tablet Take 100 mg by mouth at bedtime.      . phentermine 37.5 MG capsule Take 1 capsule (37.5 mg total) by mouth every morning.  90 capsule  0   No current facility-administered medications on file prior to visit.    BP 126/80  Pulse 85  Temp(Src) 98 F (36.7 C) (Oral)  Resp 20  Wt 232 lb (105.235 kg)  SpO2 97%      Review of Systems  Constitutional: Negative.   HENT: Negative for congestion, dental problem, hearing loss, rhinorrhea, sinus pressure, sore throat and tinnitus.   Eyes: Negative for pain, discharge and visual disturbance.  Respiratory: Positive for cough. Negative for shortness of breath.   Cardiovascular: Negative for chest pain, palpitations and leg swelling.  Gastrointestinal: Negative for nausea, vomiting, abdominal pain, diarrhea, constipation, blood in stool and abdominal distention.  Genitourinary: Negative for dysuria, urgency, frequency, hematuria, flank pain, vaginal bleeding, vaginal discharge, difficulty urinating, vaginal pain and pelvic pain.  Musculoskeletal: Negative for arthralgias, gait problem and joint swelling.  Skin: Negative for rash.  Neurological: Negative for dizziness, syncope, speech difficulty, weakness, numbness and headaches.  Hematological: Negative for adenopathy.  Psychiatric/Behavioral: Negative for behavioral problems, dysphoric mood and agitation. The patient is  not nervous/anxious.        Objective:   Physical Exam  Constitutional: She is oriented to person, place, and time. She appears well-developed and well-nourished.  HENT:  Head: Normocephalic.  Right Ear: External ear normal.  Left Ear: External ear normal.  Mouth/Throat: Oropharynx is clear and moist.  Eyes: Conjunctivae and EOM are normal. Pupils are equal, round, and reactive to light.  Neck: Normal range of motion. Neck supple. No thyromegaly present.  Cardiovascular: Normal rate, regular rhythm, normal heart sounds and intact  distal pulses.   Pulmonary/Chest: Effort normal and breath sounds normal.  Abdominal: Soft. Bowel sounds are normal. She exhibits no mass. There is no tenderness.  Musculoskeletal: Normal range of motion.  Lymphadenopathy:    She has no cervical adenopathy.  Neurological: She is alert and oriented to person, place, and time.  Skin: Skin is warm and dry. No rash noted.  Psychiatric: She has a normal mood and affect. Her behavior is normal.          Assessment & Plan:   SPN  LUL Intermediate probability of Ca.  Best option  would be PET scan followed by surgical excision if Positive.  No health insurance at this time.  Will be meeting with representative n 2 days.

## 2013-04-09 NOTE — Patient Instructions (Signed)
PET scan as discussed  Smoking tobacco is very bad for your health. You should stop smoking immediately.  Acute bronchitis symptoms are generally not helped by antibiotics.  Take over-the-counter expectorants and cough medications such as  Mucinex DM.  Call if there is no improvement in 5 to 7 days or if he developed worsening cough, fever, or new symptoms, such as shortness of breath or chest pain.

## 2013-04-11 ENCOUNTER — Other Ambulatory Visit: Payer: Self-pay | Admitting: Internal Medicine

## 2013-04-11 DIAGNOSIS — R911 Solitary pulmonary nodule: Secondary | ICD-10-CM

## 2013-04-12 ENCOUNTER — Encounter: Payer: Self-pay | Admitting: Internal Medicine

## 2013-04-30 ENCOUNTER — Encounter (HOSPITAL_COMMUNITY): Payer: Self-pay

## 2013-04-30 ENCOUNTER — Encounter: Payer: Self-pay | Admitting: Internal Medicine

## 2013-04-30 ENCOUNTER — Ambulatory Visit (INDEPENDENT_AMBULATORY_CARE_PROVIDER_SITE_OTHER): Payer: Self-pay | Admitting: Internal Medicine

## 2013-04-30 ENCOUNTER — Encounter (HOSPITAL_COMMUNITY)
Admission: RE | Admit: 2013-04-30 | Discharge: 2013-04-30 | Disposition: A | Discharge status: Home / Self Care | Payer: Self-pay | Source: Ambulatory Visit | Attending: Internal Medicine | Admitting: Internal Medicine

## 2013-04-30 VITALS — BP 140/80 | HR 79 | Ht 61.0 in | Wt 230.0 lb

## 2013-04-30 DIAGNOSIS — R05 Cough: Secondary | ICD-10-CM | POA: Insufficient documentation

## 2013-04-30 DIAGNOSIS — R918 Other nonspecific abnormal finding of lung field: Secondary | ICD-10-CM | POA: Insufficient documentation

## 2013-04-30 DIAGNOSIS — R911 Solitary pulmonary nodule: Secondary | ICD-10-CM

## 2013-04-30 DIAGNOSIS — K449 Diaphragmatic hernia without obstruction or gangrene: Secondary | ICD-10-CM | POA: Insufficient documentation

## 2013-04-30 DIAGNOSIS — R059 Cough, unspecified: Secondary | ICD-10-CM | POA: Insufficient documentation

## 2013-04-30 DIAGNOSIS — R61 Generalized hyperhidrosis: Secondary | ICD-10-CM | POA: Insufficient documentation

## 2013-04-30 MED ORDER — FLUDEOXYGLUCOSE F - 18 (FDG) INJECTION
18.5000 | Freq: Once | INTRAVENOUS | Status: AC | PRN
  Administered 2013-04-30: 18.5 via INTRAVENOUS

## 2013-04-30 NOTE — Patient Instructions (Signed)
Lung nodule Left upper lobe is small less than 1cm; new since 2005 Unlikely to be lung cancer due to ssmall size and negative PET scan but cannot be ruled out Please await to hear from Ms Lauren Campos about participation in research study for lung nodule Flu shot today Do Ct chest wo contrast super-D protocol in 3 months from 04/30/2013 I will see you in 3 months with CT chest wo contrast

## 2013-04-30 NOTE — Progress Notes (Signed)
Subjective:    Patient ID: Lauren Campos, female    DOB: 02-Oct-1955, 57 y.o.   MRN: [id number]  HPI  IOV 04/30/2013   Chief Complaint  Patient presents with  . Pulmonary Consult    for abnormal PET scan. Pt was having chest pain and SOB and this is why CXR was originally done.     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 Reported to the emergency department on 04/04/2013 with cough. Had a CT scan of the chest that showed new-onset left upper lobe nodule she 0.5 mm in size compared to a CT scan of the chest in 2005 [I. personally reviewed the scan and confirmed this nodule]. This nodule is not spiculated. Currently she is asymptomatic without any shortness of breath or cough or hemoptysis or wheezing or edema or any radiation symptoms. The nodule was followed up by a PET scan today 04/30/2013 which did not show any uptake. She's been referred here for evaluation of pulmonary nodule. She is extremely worried that she might have lung cancer due to smoking history, age and family history of cancer with her dad having had prostate cancer and mother having had breast cancer. There is no associated weight loss   Nodule [facility] hx   reports that she has been smoking Cigarettes.  She has a 10 pack-year smoking history. She has never used smokeless tobacco.   EXAM:  CT CHEST WITHOUT CONTRAST 04/04/13 TECHNIQUE:  Multidetector CT imaging of the chest was performed following the  standard protocol without IV contrast.  COMPARISON: PA and lateral chest earlier this same day, 02/27/2013  hand 05/12/2009. CT chest 12/24/2003.  FINDINGS:  There is no axillary, hilar or mediastinal lymphadenopathy. Pleural  or pericardial effusion. Heart size is normal. The esophagus is  mildly dilated with an air-fluid level. Lap band is noted. Lungs  demonstrate fairly extensive centrilobular emphysematous disease.  Small focus of micro nodularity is identified in  the right lung apex  most consistent with prior inflammatory change. No consolidative  process is present.  There is a left upper lobe nodule measuring 0.9  cm in diameter on image 14. A 0.4 cm left upper lobe nodule is seen  image 19. The lungs are otherwise unremarkable. Incidentally imaged  upper abdomen demonstrates no focal abnormality. No focal bony  abnormality is identified.  IMPRESSION:  Negative for pneumonia.  0.9 cm left upper lobe nodule could be secondary to bronchogenic  carcinoma. PET CT scan is recommended for further evaluation.  Mildly dilated esophagus with an air-fluid level may be secondary to  the patient's lap band.  Electronically Signed  By: Drusilla Kanner M.D.  On: 04/04/2013 16:40   PET scan 04/30/13 IMPRESSION:  1. The left upper lobe pulmonary nodule and additional smaller  pulmonary nodules do not show significantly increased metabolic  activity. This is a reassuring finding, although does not completely  exclude low grade neoplasm. Chest CT follow-up in 6 months  recommended.  2. No hypermetabolic nodal activity within the neck, chest, abdomen  or pelvis.  Electronically Signed  By: Roxy Horseman M.D.  On: 04/30/2013 13:24     Past Medical History  Diagnosis Date  .   07/20/2006  . BACK PAIN, LOW 07/20/2006  . BREAST LUMP 09/05/2007  . CONSTIPATION, CHRONIC 08/10/2009  . DIVERTICULOSIS, COLON 08/10/2009  . Hemoptysis 08/27/2008  . HYPERLIPIDEMIA 05/08/2007  . HYPERLIPIDEMIA 07/20/2006  . INSOMNIA NOS 07/20/2006  . 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  MIGRAINE, COMMON 05/08/2007  . OBESITY, NOS 07/20/2006  . Pneumonia   . Pulmonary nodule      Family History  Problem Relation Age of Onset  . Cancer Mother   . Diabetes Mother   . Heart disease Mother   . Cancer Father   . Diabetes Sister      History   Social History  . Marital Status: Single    Spouse Name: N/A    Number of Children: N/A  . Years of Education: N/A   Occupational History  .  Not on file.   Social History Main Topics  . Smoking status: Current Every Day Smoker -- 0.50 packs/day for 20 years    Types: Cigarettes  . Smokeless tobacco: Never Used  . Alcohol Use: No  . Drug Use: No  . Sexual Activity: Not on file   Other Topics Concern  . Not on file   Social History Narrative  . No narrative on file     No Known Allergies   Outpatient Prescriptions Prior to Visit  Medication Sig Dispense Refill  . albuterol (PROVENTIL HFA;VENTOLIN HFA) 108 (90 BASE) MCG/ACT inhaler Inhale 1-2 puffs into the lungs every 6 (six) hours as needed for wheezing.  1 Inhaler  0  . amitriptyline (ELAVIL) 100 MG tablet Take 100 mg by mouth at bedtime.      . phentermine 37.5 MG capsule Take 1 capsule (37.5 mg total) by mouth every morning.  90 capsule  0   No facility-administered medications prior to visit.      Review of Systems  Constitutional: Negative for fever and unexpected weight change.  HENT: Negative for congestion, dental problem, ear pain, nosebleeds, postnasal drip, rhinorrhea, sinus pressure, sneezing, sore throat and trouble swallowing.   Eyes: Negative for redness and itching.  Respiratory: Positive for cough and shortness of breath. Negative for chest tightness and wheezing.   Cardiovascular: Positive for chest pain. Negative for palpitations and leg swelling.  Gastrointestinal: Negative for nausea and vomiting.  Genitourinary: Negative for dysuria.  Musculoskeletal: Negative for joint swelling.  Skin: Negative for rash.  Neurological: Negative for headaches.  Hematological: Does not bruise/bleed easily.  Psychiatric/Behavioral: Negative for dysphoric mood. The patient is not nervous/anxious.        Objective:   Physical Exam  Vitals reviewed. Constitutional: She is oriented to person, place, and time. She appears well-developed and well-nourished. No distress.  Body mass index is 43.48 kg/(m^2).   HENT:  Head: Normocephalic and atraumatic.   Right Ear: External ear normal.  Left Ear: External ear normal.  Mouth/Throat: Oropharynx is clear and moist. No oropharyngeal exudate.  Eyes: Conjunctivae and EOM are normal. Pupils are equal, round, and reactive to light. Right eye exhibits no discharge. Left eye exhibits no discharge. No scleral icterus.  Neck: Normal range of motion. Neck supple. No JVD present. No tracheal deviation present. No thyromegaly present.  Cardiovascular: Normal rate, regular rhythm, normal heart sounds and intact distal pulses.  Exam reveals no gallop and no friction rub.   No murmur heard. Pulmonary/Chest: Effort normal and breath sounds normal. No respiratory distress. She has no wheezes. She has no rales. She exhibits no tenderness.  Abdominal: Soft. Bowel sounds are normal. She exhibits no distension and no mass. There is no tenderness. There is no rebound and no guarding.  Musculoskeletal: Normal range of motion. She exhibits no edema and no tenderness.  Lymphadenopathy:    She has no cervical adenopathy.  Neurological: She is alert and  oriented to person, place, and time. She has normal reflexes. No cranial nerve deficit. She exhibits normal muscle tone. Coordination normal.  Skin: Skin is warm and dry. No rash noted. She is not diaphoretic. No erythema. No pallor.  Psychiatric: She has a normal mood and affect. Her behavior is normal. Judgment and thought content normal.          Assessment & Plan:

## 2013-04-30 NOTE — Assessment & Plan Note (Signed)
Lung nodule Left upper lobe is small less than 1cm; new since 2005 Unlikely to be lung cancer due to ssmall size and negative PET scan but cannot be ruled out due to fact is nedw and age and smoking hx Overall Low-INtermediate prob for stage 1 NSCLC Too small to biopsy with accuracy  PLAN Please await to hear from Ms Lauren Campos about participation in research study for lung nodule Flu shot today Do Ct chest wo contrast super-D protocol in 3 months from 04/30/2013 I will see you in 3 months with CT chest wo contrast   She is agreeable with plan

## 2013-05-02 LAB — GLUCOSE, CAPILLARY: Glucose-Capillary: 82 mg/dL (ref 70–99)

## 2013-05-14 ENCOUNTER — Telehealth: Payer: Self-pay | Admitting: Internal Medicine

## 2013-05-14 DIAGNOSIS — N644 Mastodynia: Secondary | ICD-10-CM

## 2013-05-14 NOTE — Telephone Encounter (Signed)
Referral placed.

## 2013-05-14 NOTE — Telephone Encounter (Signed)
Pt states her left side breast hurts and throbs on the side. Pt needs referral for  Diagnostic mammogram to the breast center on [address]. Would like afternoon appt.

## 2013-05-29 ENCOUNTER — Other Ambulatory Visit: Payer: Self-pay | Admitting: Internal Medicine

## 2013-05-29 DIAGNOSIS — N644 Mastodynia: Secondary | ICD-10-CM

## 2013-05-30 ENCOUNTER — Ambulatory Visit (INDEPENDENT_AMBULATORY_CARE_PROVIDER_SITE_OTHER): Payer: BC Managed Care – PPO | Admitting: Internal Medicine

## 2013-05-30 ENCOUNTER — Encounter: Payer: Self-pay | Admitting: Internal Medicine

## 2013-05-30 VITALS — BP 120/70 | HR 87 | Temp 98.3°F | Resp 20 | Wt 232.0 lb

## 2013-05-30 DIAGNOSIS — E669 Obesity, unspecified: Secondary | ICD-10-CM

## 2013-05-30 DIAGNOSIS — N760 Acute vaginitis: Secondary | ICD-10-CM

## 2013-05-30 DIAGNOSIS — E782 Mixed hyperlipidemia: Secondary | ICD-10-CM

## 2013-05-30 DIAGNOSIS — R911 Solitary pulmonary nodule: Secondary | ICD-10-CM

## 2013-05-30 MED ORDER — METRONIDAZOLE 500 MG PO TABS: 500.0000 mg | ORAL_TABLET | Freq: Three times a day (TID) | ORAL | Status: DC

## 2013-05-30 MED ORDER — PHENTERMINE HCL 37.5 MG PO CAPS: 37.5000 mg | ORAL_CAPSULE | ORAL | Status: DC

## 2013-05-30 MED ORDER — AMITRIPTYLINE HCL 100 MG PO TABS: 100.0000 mg | ORAL_TABLET | Freq: Every day | ORAL | Status: DC

## 2013-05-30 NOTE — Progress Notes (Signed)
Pre-visit discussion using our clinic review tool. No additional management support is needed unless otherwise documented below in the visit note.  

## 2013-05-30 NOTE — Patient Instructions (Signed)
Take your antibiotic as prescribed until ALL of it is gone, but stop if you develop a rash, swelling, or any side effects of the medication.  Contact our office as soon as possible if  there are side effects of the medication.  Pulmonary followup as discussed  Return in 6 months for follow-up

## 2013-05-30 NOTE — Progress Notes (Signed)
Subjective:    Patient ID: Lauren Campos, female    DOB: September 04, 1955, 58 y.o.   MRN: [id number]  HPI  58 year old patient who is status post hysterectomy who presents with a 6 weeks history of a strong vaginal odor. This has persisted in spite of meticulous efforts at personal hygiene. She states she notices the odor  even immediately following a bath. No discharge. She also states that she has moved out of her house to do to fears of black mold exposure. She now lives with her sister. She continues to pay mortgage payments and feels she cannot sell her house due to the presence of mold.  She states that she discarded the multiple items of clothing due to the mold exposure and chronic odor that persisted in spite of the dry cleaning.  She denies any symptoms of significant asthma allergic rhinitis or symptoms of hypersensitivity pneumonitis.  Past Medical History  Diagnosis Date  .   07/20/2006  . BACK PAIN, LOW 07/20/2006  . BREAST LUMP 09/05/2007  . CONSTIPATION, CHRONIC 08/10/2009  . DIVERTICULOSIS, COLON 08/10/2009  . Hemoptysis 08/27/2008  . HYPERLIPIDEMIA 05/08/2007  . HYPERLIPIDEMIA 07/20/2006  . INSOMNIA NOS 07/20/2006  . 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 MIGRAINE, COMMON 05/08/2007  . OBESITY, NOS 07/20/2006  . Pneumonia   . Pulmonary nodule     History   Social History  . Marital Status: Single    Spouse Name: N/A    Number of Children: N/A  . Years of Education: N/A   Occupational History  . Not on file.   Social History Main Topics  . Smoking status: Current Every Day Smoker -- 0.50 packs/day for 20 years    Types: Cigarettes  . Smokeless tobacco: Never Used  . Alcohol Use: No  . Drug Use: No  . Sexual Activity: Not on file   Other Topics Concern  . Not on file   Social History Narrative  . No narrative on file    Past Surgical History  Procedure Laterality Date  . Tubal ligation    . Abdominal hysterectomy    . Laparoscopic gastric banding      Family History    Problem Relation Age of Onset  . Cancer Mother   . Diabetes Mother   . Heart disease Mother   . Cancer Father   . Diabetes Sister     No Known Allergies  Current Outpatient Prescriptions on File Prior to Visit  Medication Sig Dispense Refill  . albuterol (PROVENTIL HFA;VENTOLIN HFA) 108 (90 BASE) MCG/ACT inhaler Inhale 1-2 puffs into the lungs every 6 (six) hours as needed for wheezing.  1 Inhaler  0   No current facility-administered medications on file prior to visit.    BP 120/70  Pulse 87  Temp(Src) 98.3 F (36.8 C) (Oral)  Resp 20  Wt 232 lb (105.235 kg)  SpO2 99%       Review of Systems  Constitutional: Negative.   HENT: Negative for congestion, dental problem, hearing loss, rhinorrhea, sinus pressure, sore throat and tinnitus.   Eyes: Negative for pain, discharge and visual disturbance.  Respiratory: Negative for cough and shortness of breath.   Cardiovascular: Negative for chest pain, palpitations and leg swelling.  Gastrointestinal: Negative for nausea, vomiting, abdominal pain, diarrhea, constipation, blood in stool and abdominal distention.  Genitourinary: Negative for dysuria, urgency, frequency, hematuria, flank pain, vaginal bleeding, vaginal discharge, difficulty urinating, vaginal pain and pelvic pain.  Musculoskeletal: Negative for arthralgias, gait problem and joint swelling.  Skin: Negative for rash.  Neurological: Negative for dizziness, syncope, speech difficulty, weakness, numbness and headaches.  Hematological: Negative for adenopathy.  Psychiatric/Behavioral: Negative for behavioral problems, dysphoric mood and agitation. The patient is not nervous/anxious.        Objective:   Physical Exam  Constitutional: She appears well-developed and well-nourished. No distress.  Genitourinary: Vagina normal.  Vaginal exam unremarkable without discharge           Assessment & Plan:   Vaginal odor without clinical evidence of infection. Will  empirically treat with Flagyl for 7 days. Black mold exposure. This was discussed at length. The patient was made aware that there is much controversy in the medical literature concerning this topic. The patient has no symptoms to suggest asthma allergic rhinitis or hypersensitivity pneumonitis and it is doubtful that she has any symptoms or illnesses related to black mold

## 2013-05-31 ENCOUNTER — Telehealth: Payer: Self-pay | Admitting: Internal Medicine

## 2013-05-31 NOTE — Telephone Encounter (Signed)
Relevant patient education mailed to patient.  

## 2013-06-01 ENCOUNTER — Encounter (HOSPITAL_COMMUNITY): Payer: Self-pay | Admitting: Emergency Medicine

## 2013-06-01 ENCOUNTER — Emergency Department (HOSPITAL_COMMUNITY)
Admission: EM | Admit: 2013-06-01 | Discharge: 2013-06-01 | Disposition: A | Discharge status: Home / Self Care | Payer: Worker's Compensation | Attending: Emergency Medicine | Admitting: Emergency Medicine

## 2013-06-01 ENCOUNTER — Emergency Department (HOSPITAL_COMMUNITY): Payer: Worker's Compensation

## 2013-06-01 DIAGNOSIS — E785 Hyperlipidemia, unspecified: Secondary | ICD-10-CM | POA: Insufficient documentation

## 2013-06-01 DIAGNOSIS — Y9389 Activity, other specified: Secondary | ICD-10-CM | POA: Insufficient documentation

## 2013-06-01 DIAGNOSIS — W010XXA Fall on same level from slipping, tripping and stumbling without subsequent striking against object, initial encounter: Secondary | ICD-10-CM | POA: Insufficient documentation

## 2013-06-01 DIAGNOSIS — Y929 Unspecified place or not applicable: Secondary | ICD-10-CM | POA: Insufficient documentation

## 2013-06-01 DIAGNOSIS — R0602 Shortness of breath: Secondary | ICD-10-CM | POA: Insufficient documentation

## 2013-06-01 DIAGNOSIS — IMO0002 Reserved for concepts with insufficient information to code with codable children: Secondary | ICD-10-CM | POA: Insufficient documentation

## 2013-06-01 DIAGNOSIS — M549 Dorsalgia, unspecified: Secondary | ICD-10-CM | POA: Insufficient documentation

## 2013-06-01 DIAGNOSIS — F17200 Nicotine dependence, unspecified, uncomplicated: Secondary | ICD-10-CM | POA: Insufficient documentation

## 2013-06-01 DIAGNOSIS — R0789 Other chest pain: Secondary | ICD-10-CM

## 2013-06-01 MED ORDER — IBUPROFEN 800 MG PO TABS
800.0000 mg | ORAL_TABLET | Freq: Once | ORAL | Status: AC
Start: 2013-06-01 — End: 2013-06-01
  Administered 2013-06-01: 800 mg via ORAL
  Filled 2013-06-01: qty 1

## 2013-06-01 MED ORDER — IBUPROFEN 800 MG PO TABS: 800.0000 mg | ORAL_TABLET | Freq: Three times a day (TID) | ORAL | Status: DC

## 2013-06-01 NOTE — ED Provider Notes (Signed)
CSN: [id number]     Arrival date & time 06/01/13  1304 History  This chart was scribed for non-physician practitioner working with Neta Ehlers, MD by Stacy Gardner, ED scribe. This patient was seen in room WTR7/WTR7 and the patient's care was started at 1:41 PM.   First MD Initiated Contact with Patient 06/01/13 1327     Chief Complaint  Patient presents with  . Fall  . Rib Injury   (Consider location/radiation/quality/duration/timing/severity/associated sxs/prior Treatment) The history is provided by the patient and medical records. No language interpreter was used.   HPI Comments: Lauren Campos is a 58 y.o. female who presents to the Emergency Department complaining of fall that occurred this morning while coming out of her car.  Pt fell backwards onto her right side. She is experiencing anterior right rib pain that radiates to her back mid thoracic region. She denies LOC and head injury. She is experiencing mild SOB. Denies taking any medications for pain. There are no other injuries noted. No bruising noted. Pt has a past medical hx of hemoptysis, hyperlipidemia, pulmonary nodule and lower lumbar pain. Her PCP is Rolla Flatten MD.    Past Medical History  Diagnosis Date  .   07/20/2006  . BACK PAIN, LOW 07/20/2006  . BREAST LUMP 09/05/2007  . CONSTIPATION, CHRONIC 08/10/2009  . DIVERTICULOSIS, COLON 08/10/2009  . Hemoptysis 08/27/2008  . HYPERLIPIDEMIA 05/08/2007  . HYPERLIPIDEMIA 07/20/2006  . INSOMNIA NOS 07/20/2006  . 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 MIGRAINE, COMMON 05/08/2007  . OBESITY, NOS 07/20/2006  . Pneumonia   . Pulmonary nodule    Past Surgical History  Procedure Laterality Date  . Tubal ligation    . Abdominal hysterectomy    . Laparoscopic gastric banding     Family History  Problem Relation Age of Onset  . Cancer Mother   . Diabetes Mother   . Heart disease Mother   . Cancer Father   . Diabetes Sister    History  Substance Use Topics  . Smoking  status: Current Every Day Smoker -- 0.50 packs/day for 20 years    Types: Cigarettes  . Smokeless tobacco: Never Used  . Alcohol Use: No   OB History   Grav Para Term Preterm Abortions TAB SAB Ect Mult Living                 Review of Systems  Respiratory: Positive for shortness of breath.   Musculoskeletal: Positive for arthralgias, back pain and myalgias.  Neurological: Negative for syncope.  All other systems reviewed and are negative.    Allergies  Review of patient's allergies indicates no known allergies.  Home Medications   Current Outpatient Rx  Name  Route  Sig  Dispense  Refill  . amitriptyline (ELAVIL) 100 MG tablet   Oral   Take 1 tablet (100 mg total) by mouth at bedtime.   90 tablet   1   . metroNIDAZOLE (FLAGYL) 500 MG tablet   Oral   Take 1 tablet (500 mg total) by mouth 3 (three) times daily.   21 tablet   0   . phentermine 37.5 MG capsule   Oral   Take 1 capsule (37.5 mg total) by mouth every morning.   90 capsule   1    BP 119/60  Pulse 94  Temp(Src) 97.5 F (36.4 C) (Oral)  Resp 16  SpO2 97% Physical Exam  Nursing note and vitals reviewed. Constitutional: She is oriented to person, place, and time.  She appears well-developed and well-nourished. No distress.  HENT:  Head: Normocephalic and atraumatic.  Mouth/Throat: Oropharynx is clear and moist.  Eyes: Conjunctivae are normal. Pupils are equal, round, and reactive to light. No scleral icterus.  Neck: Neck supple.  Cardiovascular: Normal rate, regular rhythm, normal heart sounds and intact distal pulses.   No murmur heard. Pulmonary/Chest: Effort normal and breath sounds normal. No stridor. No respiratory distress. She has no wheezes. She has no rales. She exhibits tenderness (anterior inferior chest wall tenderness). She exhibits no crepitus.  No paradoxical breathing  Abdominal: Soft. There is no tenderness.  Musculoskeletal: Normal range of motion.  Neurological: She is alert and  oriented to person, place, and time.  Skin: Skin is warm and dry. No rash noted.  Psychiatric: She has a normal mood and affect. Her behavior is normal.    ED Course  Procedures (including critical care time) DIAGNOSTIC STUDIES: Oxygen Saturation is 97% on room air, normal by my interpretation.    COORDINATION OF CARE:  1:39 PM Discussed course of care with pt which includes unilateral right chest x-ray. Pt understands and agrees.  Labs Review Labs Reviewed - No data to display Imaging Review Dg Ribs Unilateral W/chest Right  06/01/2013   CLINICAL DATA:  Fall, right rib pain  EXAM: RIGHT RIBS AND CHEST - 3+ VIEW  COMPARISON:  None.  FINDINGS: No fracture or other bone lesions are seen involving the ribs. There is no evidence of pneumothorax or pleural effusion. Both lungs are clear. Heart size and mediastinal contours are within normal limits.  IMPRESSION: Negative.   Electronically Signed   By: Lahoma Crocker M.D.   On: 06/01/2013 14:01    EKG Interpretation   None       MDM   1. Right-sided chest wall pain    BP 119/60  Pulse 94  Temp(Src) 97.5 F (36.4 C) (Oral)  Resp 16  SpO2 97%  I have reviewed nursing notes and vital signs. I personally reviewed the imaging tests through PACS system  I reviewed available ER/hospitalization records thought the EMR  I personally performed the services described in this documentation, which was scribed in my presence. The recorded information has been reviewed and is accurate.     Domenic Moras, PA-C 06/01/13 1413

## 2013-06-01 NOTE — ED Provider Notes (Signed)
Medical screening examination/treatment/procedure(s) were performed by non-physician practitioner and as supervising physician I was immediately available for consultation/collaboration.   Neta Ehlers, MD 06/01/13 [phone]

## 2013-06-01 NOTE — Discharge Instructions (Signed)

## 2013-06-01 NOTE — ED Notes (Signed)
Pt stepped on ice getting into car this am, fell on rt side, now has pain in rt breast rib area around toward back. Reproducible, no LOC or other injuries noted.

## 2013-06-05 ENCOUNTER — Ambulatory Visit
Admission: RE | Admit: 2013-06-05 | Discharge: 2013-06-05 | Disposition: A | Discharge status: Home / Self Care | Payer: BC Managed Care – PPO | Source: Ambulatory Visit | Attending: Internal Medicine | Admitting: Internal Medicine

## 2013-06-05 DIAGNOSIS — N644 Mastodynia: Secondary | ICD-10-CM

## 2013-07-05 ENCOUNTER — Ambulatory Visit (INDEPENDENT_AMBULATORY_CARE_PROVIDER_SITE_OTHER): Payer: BC Managed Care – PPO | Admitting: Internal Medicine

## 2013-07-05 ENCOUNTER — Encounter: Payer: Self-pay | Admitting: Internal Medicine

## 2013-07-05 VITALS — BP 130/80 | HR 86 | Temp 97.8°F | Resp 20 | Ht 61.0 in | Wt 221.0 lb

## 2013-07-05 DIAGNOSIS — E785 Hyperlipidemia, unspecified: Secondary | ICD-10-CM

## 2013-07-05 DIAGNOSIS — M549 Dorsalgia, unspecified: Secondary | ICD-10-CM

## 2013-07-05 MED ORDER — BETAMETHASONE VALERATE 0.1 % EX LOTN: 1.0000 "application " | TOPICAL_LOTION | Freq: Two times a day (BID) | CUTANEOUS | Status: DC

## 2013-07-05 MED ORDER — IBUPROFEN 800 MG PO TABS: 800.0000 mg | ORAL_TABLET | Freq: Three times a day (TID) | ORAL | Status: DC

## 2013-07-05 NOTE — Progress Notes (Signed)
Subjective:    Patient ID: Lauren Campos, female    DOB: 02/02/1956, 58 y.o.   MRN: [id number]  HPI   58 year old patient who is seen today with a chief complaint of low back pain.  She fell on January 10 and was evaluated at the ED.  She apparently slipped on some black ice traumatized in her right chest wall.  Hip and back area.  She continues to have pain in the lumbar spine.  Intermittently.  She works 12 hour shifts on weekends and is scheduled to work over the next 2 days.  She states that she uses ibuprofen with nice benefit.  Past Medical History  Diagnosis Date  .   07/20/2006  . BACK PAIN, LOW 07/20/2006  . BREAST LUMP 09/05/2007  . CONSTIPATION, CHRONIC 08/10/2009  . DIVERTICULOSIS, COLON 08/10/2009  . Hemoptysis 08/27/2008  . HYPERLIPIDEMIA 05/08/2007  . HYPERLIPIDEMIA 07/20/2006  . INSOMNIA NOS 07/20/2006  . 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 MIGRAINE, COMMON 05/08/2007  . OBESITY, NOS 07/20/2006  . Pneumonia   . Pulmonary nodule     History   Social History  . Marital Status: Divorced    Spouse Name: N/A    Number of Children: N/A  . Years of Education: N/A   Occupational History  . Not on file.   Social History Main Topics  . Smoking status: Current Every Day Smoker -- 0.50 packs/day for 20 years    Types: Cigarettes  . Smokeless tobacco: Never Used  . Alcohol Use: No  . Drug Use: No  . Sexual Activity: Not on file   Other Topics Concern  . Not on file   Social History Narrative  . No narrative on file    Past Surgical History  Procedure Laterality Date  . Tubal ligation    . Abdominal hysterectomy    . Laparoscopic gastric banding      Family History  Problem Relation Age of Onset  . Cancer Mother   . Diabetes Mother   . Heart disease Mother   . Cancer Father   . Diabetes Sister     No Known Allergies  Current Outpatient Prescriptions on File Prior to Visit  Medication Sig Dispense Refill  . amitriptyline (ELAVIL) 100 MG tablet Take 1 tablet (100  mg total) by mouth at bedtime.  90 tablet  1  . ibuprofen (ADVIL,MOTRIN) 800 MG tablet Take 1 tablet (800 mg total) by mouth 3 (three) times daily.  21 tablet  0  . phentermine 37.5 MG capsule Take 1 capsule (37.5 mg total) by mouth every morning.  90 capsule  1   No current facility-administered medications on file prior to visit.    BP 130/80  Pulse 86  Temp(Src) 97.8 F (36.6 C) (Oral)  Resp 20  Ht 5\' 1"  (1.549 m)  Wt 221 lb (100.245 kg)  BMI 41.78 kg/m2  SpO2 97%       Review of Systems  Constitutional: Negative.   HENT: Negative for congestion, dental problem, hearing loss, rhinorrhea, sinus pressure, sore throat and tinnitus.   Eyes: Negative for pain, discharge and visual disturbance.  Respiratory: Negative for cough and shortness of breath.   Cardiovascular: Negative for chest pain, palpitations and leg swelling.  Gastrointestinal: Negative for nausea, vomiting, abdominal pain, diarrhea, constipation, blood in stool and abdominal distention.  Genitourinary: Negative for dysuria, urgency, frequency, hematuria, flank pain, vaginal bleeding, vaginal discharge, difficulty urinating, vaginal pain and pelvic pain.  Musculoskeletal: Positive for back pain. Negative for  arthralgias, gait problem and joint swelling.  Skin: Negative for rash.  Neurological: Negative for dizziness, syncope, speech difficulty, weakness, numbness and headaches.  Hematological: Negative for adenopathy.  Psychiatric/Behavioral: Negative for behavioral problems, dysphoric mood and agitation. The patient is not nervous/anxious.        Objective:   Physical Exam  Constitutional: She appears well-developed and well-nourished. No distress.  Musculoskeletal: Normal range of motion. She exhibits no edema and no tenderness.  Negative straight leg test          Assessment & Plan:   Posttraumatic lumbar pain.  Will continue ibuprofen, which has been quite helpful, as well as rest Obesity History  of pulmonary nodule.  Followup pulmonary medicine in 3 months at

## 2013-07-05 NOTE — Progress Notes (Signed)
Pre-visit discussion using our clinic review tool. No additional management support is needed unless otherwise documented below in the visit note.  

## 2013-07-05 NOTE — Patient Instructions (Signed)
Back Exercises Back exercises help treat and prevent back injuries. The goal is to increase your strength in your belly (abdominal) and back muscles. These exercises can also help with flexibility. Start these exercises when told by your doctor. HOME CARE Back exercises include: Pelvic Tilt.  Lie on your back with your knees bent. Tilt your pelvis until the lower part of your back is against the floor. Hold this position 5 to 10 sec. Repeat this exercise 5 to 10 times. Knee to Chest.  Pull 1 knee up against your chest and hold for 20 to 30 seconds. Repeat this with the other knee. This may be done with the other leg straight or bent, whichever feels better. Then, pull both knees up against your chest. Sit-Ups or Curl-Ups.  Bend your knees 90 degrees. Start with tilting your pelvis, and do a partial, slow sit-up. Only lift your upper half 30 to 45 degrees off the floor. Take at least 2 to 3 seonds for each sit-up. Do not do sit-ups with your knees out straight. If partial sit-ups are difficult, simply do the above but with only tightening your belly (abdominal) muscles and holding it as told. Hip-Lift.  Lie on your back with your knees flexed 90 degrees. Push down with your feet and shoulders as you raise your hips 2 inches off the floor. Hold for 10 seconds, repeat 5 to 10 times. Back Arches.  Lie on your stomach. Prop yourself up on bent elbows. Slowly press on your hands, causing an arch in your low back. Repeat 3 to 5 times. Shoulder-Lifts.  Lie face down with arms beside your body. Keep hips and belly pressed to floor as you slowly lift your head and shoulders off the floor. Do not overdo your exercises. Be careful in the beginning. Exercises may cause you some mild back discomfort. If the pain lasts for more than 15 minutes, stop the exercises until you see your doctor. Improvement with exercise for back problems is slow.  Document Released: 06/11/2010 Document Revised: 08/01/2011  Document Reviewed: 03/10/2011 [facility] Patient Information 2014 [place], [place]. Back Injury Prevention The following tips can help you to prevent a back injury. PHYSICAL FITNESS  Exercise often. Try to develop strong stomach (abdominal) muscles.  Do aerobic exercises often. This includes walking, jogging, biking, swimming.  Do exercises that help with balance and strength often. This includes tai chi and yoga.  Stretch before and after you exercise.  Keep a healthy weight. DIET   Ask your doctor how much calcium and vitamin D you need every day.  Include calcium in your diet. Foods high in calcium include dairy products; green, leafy vegetables; and products with calcium added (fortified).  Include vitamin D in your diet. Foods high in vitamin D include milk and products with vitamin D added.  Think about taking a multivitamin or other nutritional products called " supplements."  Stop smoking if you smoke. POSTURE   Sit and stand up straight. Avoid leaning forward or hunching over.  Choose chairs that support your lower back.  If you work at a desk:  Sit close to your work so you do not lean over.  Keep your chin tucked in.  Keep your neck drawn back.  Keep your elbows bent at a right angle. Your arms should look like the letter "L."  Sit high and close to the steering wheel when you drive. Add low back support to your car seat if needed.  Avoid sitting or standing in one position  for too long. Get up and move around every hour. Take breaks if you are driving for a long time.  Sleep on your side with your knees slightly bent. You can also sleep on your back with a pillow under your knees. Do not sleep on your stomach. LIFTING, TWISTING, AND REACHING  Avoid heavy lifting, especially lifting over and over again. If you must do heavy lifting:  Stretch before lifting.  Work slowly.  Rest between lifts.  Use carts and dollies to move objects when possible.  Make  several small trips instead of carrying 1 heavy load.  Ask for help when you need it.  Ask for help when moving big, awkward objects.  Follow these steps when lifting:  Stand with your feet shoulder-width apart.  Get as close to the object as you can. Do not pick up heavy objects that are far from your body.  Use handles or lifting straps when possible.  Bend at your knees. Squat down, but keep your heels off the floor.  Keep your shoulders back, your chin tucked in, and your back straight.  Lift the object slowly. Tighten the muscles in your legs, stomach, and butt. Keep the object as close to the center of your body as possible.  Reverse these directions when you put a load down.  Do not:  Lift the object above your waist.  Twist at the waist while lifting or carrying a load. Move your feet if you need to turn, not your waist.  Bend over without bending at your knees.  Avoid reaching over your head, across a table, or for an object on a high surface. OTHER TIPS  Avoid wet floors and keep sidewalks clear of ice.  Do not sleep on a mattress that is too soft or too hard.  Keep items that you use often within easy reach.  Put heavier objects on shelves at waist level. Put lighter objects on lower or higher shelves.  Find ways to lessen your stress. You can try exercise, massage, or relaxation.  Get help for depression or anxiety if needed. GET HELP IF:  You injure your back.  You have questions about diet, exercise, or other ways to prevent back injuries. MAKE SURE YOU:  Understand these instructions.  Will watch your condition.  Will get help right away if you are not doing well or get worse. Document Released: 10/26/2007 Document Revised: 08/01/2011 Document Reviewed: 06/20/2011 [facility] Patient Information 2014 [place], [place]. Low Back Sprain with Rehab  A sprain is an injury in which a ligament is torn. The ligaments of the lower back are vulnerable to  sprains. However, they are strong and require great force to be injured. These ligaments are important for stabilizing the spinal column. Sprains are classified into three categories. Grade 1 sprains cause pain, but the tendon is not lengthened. Grade 2 sprains include a lengthened ligament, due to the ligament being stretched or partially ruptured. With grade 2 sprains there is still function, although the function may be decreased. Grade 3 sprains involve a complete tear of the tendon or muscle, and function is usually impaired. SYMPTOMS   Severe pain in the lower back.  Sometimes, a feeling of a "pop," "snap," or tear, at the time of injury.  Tenderness and sometimes swelling at the injury site.  Uncommonly, bruising (contusion) within 48 hours of injury.  Muscle spasms in the back. CAUSES  Low back sprains occur when a force is placed on the ligaments that is greater  than they can handle. Common causes of injury include:  Performing a stressful act while off-balance.  Repetitive stressful activities that involve movement of the lower back.  Direct hit (trauma) to the lower back. RISK INCREASES WITH:  Contact sports (football, wrestling).  Collisions (major skiing accidents).  Sports that require throwing or lifting (baseball, weightlifting).  Sports involving twisting of the spine (gymnastics, diving, tennis, golf).  Poor strength and flexibility.  Inadequate protection.  Previous back injury or surgery (especially fusion). PREVENTION  Wear properly fitted and padded protective equipment.  Warm up and stretch properly before activity.  Allow for adequate recovery between workouts.  Maintain physical fitness:  Strength, flexibility, and endurance.  Cardiovascular fitness.  Maintain a healthy body weight. PROGNOSIS  If treated properly, low back sprains usually heal with non-surgical treatment. The length of time for healing depends on the severity of the injury.    RELATED COMPLICATIONS   Recurring symptoms, resulting in a chronic problem.  Chronic inflammation and pain in the low back.  Delayed healing or resolution of symptoms, especially if activity is resumed too soon.  Prolonged impairment.  Unstable or arthritic joints of the low back. TREATMENT  Treatment first involves the use of ice and medicine, to reduce pain and inflammation. The use of strengthening and stretching exercises may help reduce pain with activity. These exercises may be performed at home or with a therapist. Severe injuries may require referral to a therapist for further evaluation and treatment, such as ultrasound. Your caregiver may advise that you wear a back brace or corset, to help reduce pain and discomfort. Often, prolonged bed rest results in greater harm then benefit. Corticosteroid injections may be recommended. However, these should be reserved for the most serious cases. It is important to avoid using your back when lifting objects. At night, sleep on your back on a firm mattress, with a pillow placed under your knees. If non-surgical treatment is unsuccessful, surgery may be needed.  MEDICATION   If pain medicine is needed, nonsteroidal anti-inflammatory medicines (aspirin and ibuprofen), or other minor pain relievers (acetaminophen), are often advised.  Do not take pain medicine for 7 days before surgery.  Prescription pain relievers may be given, if your caregiver thinks they are needed. Use only as directed and only as much as you need.  Ointments applied to the skin may be helpful.  Corticosteroid injections may be given by your caregiver. These injections should be reserved for the most serious cases, because they may only be given a certain number of times. HEAT AND COLD  Cold treatment (icing) should be applied for 10 to 15 minutes every 2 to 3 hours for inflammation and pain, and immediately after activity that aggravates your symptoms. Use ice packs or  an ice massage.  Heat treatment may be used before performing stretching and strengthening activities prescribed by your caregiver, physical therapist, or athletic trainer. Use a heat pack or a warm water soak. SEEK MEDICAL CARE IF:   Symptoms get worse or do not improve in 2 to 4 weeks, despite treatment.  You develop numbness or weakness in either leg.  You lose bowel or bladder function.  Any of the following occur after surgery: fever, increased pain, swelling, redness, drainage of fluids, or bleeding in the affected area.  New, unexplained symptoms develop. (Drugs used in treatment may produce side effects.) EXERCISES  RANGE OF MOTION (ROM) AND STRETCHING EXERCISES - Low Back Sprain Most people with lower back pain will find that their  symptoms get worse with excessive bending forward (flexion) or arching at the lower back (extension). The exercises that will help resolve your symptoms will focus on the opposite motion.  Your physician, physical therapist or athletic trainer will help you determine which exercises will be most helpful to resolve your lower back pain. Do not complete any exercises without first consulting with your caregiver. Discontinue any exercises which make your symptoms worse, until you speak to your caregiver. If you have pain, numbness or tingling which travels down into your buttocks, leg or foot, the goal of the therapy is for these symptoms to move closer to your back and eventually resolve. Sometimes, these leg symptoms will get better, but your lower back pain may worsen. This is often an indication of progress in your rehabilitation. Be very alert to any changes in your symptoms and the activities in which you participated in the 24 hours prior to the change. Sharing this information with your caregiver will allow him or her to most efficiently treat your condition. These exercises may help you when beginning to rehabilitate your injury. Your symptoms may resolve  with or without further involvement from your physician, physical therapist or athletic trainer. While completing these exercises, remember:   Restoring tissue flexibility helps normal motion to return to the joints. This allows healthier, less painful movement and activity.  An effective stretch should be held for at least 30 seconds.  A stretch should never be painful. You should only feel a gentle lengthening or release in the stretched tissue. FLEXION RANGE OF MOTION AND STRETCHING EXERCISES: STRETCH  Flexion, Single Knee to Chest   Lie on a firm bed or floor with both legs extended in front of you.  Keeping one leg in contact with the floor, bring your opposite knee to your chest. Hold your leg in place by either grabbing behind your thigh or at your knee.  Pull until you feel a gentle stretch in your low back. Hold __________ seconds.  Slowly release your grasp and repeat the exercise with the opposite side. Repeat __________ times. Complete this exercise __________ times per day.  STRETCH  Flexion, Double Knee to Chest  Lie on a firm bed or floor with both legs extended in front of you.  Keeping one leg in contact with the floor, bring your opposite knee to your chest.  Tense your stomach muscles to support your back and then lift your other knee to your chest. Hold your legs in place by either grabbing behind your thighs or at your knees.  Pull both knees toward your chest until you feel a gentle stretch in your low back. Hold __________ seconds.  Tense your stomach muscles and slowly return one leg at a time to the floor. Repeat __________ times. Complete this exercise __________ times per day.  STRETCH  Low Trunk Rotation  Lie on a firm bed or floor. Keeping your legs in front of you, bend your knees so they are both pointed toward the ceiling and your feet are flat on the floor.  Extend your arms out to the side. This will stabilize your upper body by keeping your  shoulders in contact with the floor.  Gently and slowly drop both knees together to one side until you feel a gentle stretch in your low back. Hold for __________ seconds.  Tense your stomach muscles to support your lower back as you bring your knees back to the starting position. Repeat the exercise to the other side. Repeat  __________ times. Complete this exercise __________ times per day  EXTENSION RANGE OF MOTION AND FLEXIBILITY EXERCISES: STRETCH  Extension, Prone on Elbows   Lie on your stomach on the floor, a bed will be too soft. Place your palms about shoulder width apart and at the height of your head.  Place your elbows under your shoulders. If this is too painful, stack pillows under your chest.  Allow your body to relax so that your hips drop lower and make contact more completely with the floor.  Hold this position for __________ seconds.  Slowly return to lying flat on the floor. Repeat __________ times. Complete this exercise __________ times per day.  RANGE OF MOTION  Extension, Prone Press Ups  Lie on your stomach on the floor, a bed will be too soft. Place your palms about shoulder width apart and at the height of your head.  Keeping your back as relaxed as possible, slowly straighten your elbows while keeping your hips on the floor. You may adjust the placement of your hands to maximize your comfort. As you gain motion, your hands will come more underneath your shoulders.  Hold this position __________ seconds.  Slowly return to lying flat on the floor. Repeat __________ times. Complete this exercise __________ times per day.  RANGE OF MOTION- Quadruped, Neutral Spine   Assume a hands and knees position on a firm surface. Keep your hands under your shoulders and your knees under your hips. You may place padding under your knees for comfort.  Drop your head and point your tailbone toward the ground below you. This will round out your lower back like an angry cat.  Hold this position for __________ seconds.  Slowly lift your head and release your tail bone so that your back sags into a large arch, like an old horse.  Hold this position for __________ seconds.  Repeat this until you feel limber in your low back.  Now, find your "sweet spot." This will be the most comfortable position somewhere between the two previous positions. This is your neutral spine. Once you have found this position, tense your stomach muscles to support your low back.  Hold this position for __________ seconds. Repeat __________ times. Complete this exercise __________ times per day.  STRENGTHENING EXERCISES - Low Back Sprain These exercises may help you when beginning to rehabilitate your injury. These exercises should be done near your "sweet spot." This is the neutral, low-back arch, somewhere between fully rounded and fully arched, that is your least painful position. When performed in this safe range of motion, these exercises can be used for people who have either a flexion or extension based injury. These exercises may resolve your symptoms with or without further involvement from your physician, physical therapist or athletic trainer. While completing these exercises, remember:   Muscles can gain both the endurance and the strength needed for everyday activities through controlled exercises.  Complete these exercises as instructed by your physician, physical therapist or athletic trainer. Increase the resistance and repetitions only as guided.  You may experience muscle soreness or fatigue, but the pain or discomfort you are trying to eliminate should never worsen during these exercises. If this pain does worsen, stop and make certain you are following the directions exactly. If the pain is still present after adjustments, discontinue the exercise until you can discuss the trouble with your caregiver. STRENGTHENING Deep Abdominals, Pelvic Tilt   Lie on a firm bed or floor.  Keeping your legs in  front of you, bend your knees so they are both pointed toward the ceiling and your feet are flat on the floor.  Tense your lower abdominal muscles to press your low back into the floor. This motion will rotate your pelvis so that your tail bone is scooping upwards rather than pointing at your feet or into the floor. With a gentle tension and even breathing, hold this position for __________ seconds. Repeat __________ times. Complete this exercise __________ times per day.  STRENGTHENING  Abdominals, Crunches   Lie on a firm bed or floor. Keeping your legs in front of you, bend your knees so they are both pointed toward the ceiling and your feet are flat on the floor. Cross your arms over your chest.  Slightly tip your chin down without bending your neck.  Tense your abdominals and slowly lift your trunk high enough to just clear your shoulder blades. Lifting higher can put excessive stress on the lower back and does not further strengthen your abdominal muscles.  Control your return to the starting position. Repeat __________ times. Complete this exercise __________ times per day.  STRENGTHENING  Quadruped, Opposite UE/LE Lift   Assume a hands and knees position on a firm surface. Keep your hands under your shoulders and your knees under your hips. You may place padding under your knees for comfort.  Find your neutral spine and gently tense your abdominal muscles so that you can maintain this position. Your shoulders and hips should form a rectangle that is parallel with the floor and is not twisted.  Keeping your trunk steady, lift your right hand no higher than your shoulder and then your left leg no higher than your hip. Make sure you are not holding your breath. Hold this position for __________ seconds.  Continuing to keep your abdominal muscles tense and your back steady, slowly return to your starting position. Repeat with the opposite arm and leg. Repeat  __________ times. Complete this exercise __________ times per day.  STRENGTHENING  Abdominals and Quadriceps, Straight Leg Raise   Lie on a firm bed or floor with both legs extended in front of you.  Keeping one leg in contact with the floor, bend the other knee so that your foot can rest flat on the floor.  Find your neutral spine, and tense your abdominal muscles to maintain your spinal position throughout the exercise.  Slowly lift your straight leg off the floor about 6 inches for a count of 15, making sure to not hold your breath.  Still keeping your neutral spine, slowly lower your leg all the way to the floor. Repeat this exercise with each leg __________ times. Complete this exercise __________ times per day. POSTURE AND BODY MECHANICS CONSIDERATIONS - Low Back Sprain Keeping correct posture when sitting, standing or completing your activities will reduce the stress put on different body tissues, allowing injured tissues a chance to heal and limiting painful experiences. The following are general guidelines for improved posture. Your physician or physical therapist will provide you with any instructions specific to your needs. While reading these guidelines, remember:  The exercises prescribed by your provider will help you have the flexibility and strength to maintain correct postures.  The correct posture provides the best environment for your joints to work. All of your joints have less wear and tear when properly supported by a spine with good posture. This means you will experience a healthier, less painful body.  Correct posture must be practiced with all of  your activities, especially prolonged sitting and standing. Correct posture is as important when doing repetitive low-stress activities (typing) as it is when doing a single heavy-load activity (lifting). RESTING POSITIONS Consider which positions are most painful for you when choosing a resting position. If you have pain with  flexion-based activities (sitting, bending, stooping, squatting), choose a position that allows you to rest in a less flexed posture. You would want to avoid curling into a fetal position on your side. If your pain worsens with extension-based activities (prolonged standing, working overhead), avoid resting in an extended position such as sleeping on your stomach. Most people will find more comfort when they rest with their spine in a more neutral position, neither too rounded nor too arched. Lying on a non-sagging bed on your side with a pillow between your knees, or on your back with a pillow under your knees will often provide some relief. Keep in mind, being in any one position for a prolonged period of time, no matter how correct your posture, can still lead to stiffness. PROPER SITTING POSTURE In order to minimize stress and discomfort on your spine, you must sit with correct posture. Sitting with good posture should be effortless for a healthy body. Returning to good posture is a gradual process. Many people can work toward this most comfortably by using various supports until they have the flexibility and strength to maintain this posture on their own. When sitting with proper posture, your ears will fall over your shoulders and your shoulders will fall over your hips. You should use the back of the chair to support your upper back. Your lower back will be in a neutral position, just slightly arched. You may place a small pillow or folded towel at the base of your lower back for  support.  When working at a desk, create an environment that supports good, upright posture. Without extra support, muscles tire, which leads to excessive strain on joints and other tissues. Keep these recommendations in mind: CHAIR:  A chair should be able to slide under your desk when your back makes contact with the back of the chair. This allows you to work closely.  The chair's height should allow your eyes to be  level with the upper part of your monitor and your hands to be slightly lower than your elbows. BODY POSITION  Your feet should make contact with the floor. If this is not possible, use a foot rest.  Keep your ears over your shoulders. This will reduce stress on your neck and low back. INCORRECT SITTING POSTURES  If you are feeling tired and unable to assume a healthy sitting posture, do not slouch or slump. This puts excessive strain on your back tissues, causing more damage and pain. Healthier options include:  Using more support, like a lumbar pillow.  Switching tasks to something that requires you to be upright or walking.  Talking a brief walk.  Lying down to rest in a neutral-spine position. PROLONGED STANDING WHILE SLIGHTLY LEANING FORWARD  When completing a task that requires you to lean forward while standing in one place for a long time, place either foot up on a stationary 2-4 inch high object to help maintain the best posture. When both feet are on the ground, the lower back tends to lose its slight inward curve. If this curve flattens (or becomes too large), then the back and your other joints will experience too much stress, tire more quickly, and can cause pain. CORRECT STANDING  POSTURES Proper standing posture should be assumed with all daily activities, even if they only take a few moments, like when brushing your teeth. As in sitting, your ears should fall over your shoulders and your shoulders should fall over your hips. You should keep a slight tension in your abdominal muscles to brace your spine. Your tailbone should point down to the ground, not behind your body, resulting in an over-extended swayback posture.  INCORRECT STANDING POSTURES  Common incorrect standing postures include a forward head, locked knees and/or an excessive swayback. WALKING Walk with an upright posture. Your ears, shoulders and hips should all line-up. PROLONGED ACTIVITY IN A FLEXED  POSITION When completing a task that requires you to bend forward at your waist or lean over a low surface, try to find a way to stabilize 3 out of 4 of your limbs. You can place a hand or elbow on your thigh or rest a knee on the surface you are reaching across. This will provide you more stability, so that your muscles do not tire as quickly. By keeping your knees relaxed, or slightly bent, you will also reduce stress across your lower back. CORRECT LIFTING TECHNIQUES DO :  Assume a wide stance. This will provide you more stability and the opportunity to get as close as possible to the object which you are lifting.  Tense your abdominals to brace your spine. Bend at the knees and hips. Keeping your back locked in a neutral-spine position, lift using your leg muscles. Lift with your legs, keeping your back straight.  Test the weight of unknown objects before attempting to lift them.  Try to keep your elbows locked down at your sides in order get the best strength from your shoulders when carrying an object.  Always ask for help when lifting heavy or awkward objects. INCORRECT LIFTING TECHNIQUES DO NOT:   Lock your knees when lifting, even if it is a small object.  Bend and twist. Pivot at your feet or move your feet when needing to change directions.  Assume that you can safely pick up even a paperclip without proper posture. Document Released: 05/09/2005 Document Revised: 08/01/2011 Document Reviewed: 08/21/2008 [facility] Patient Information 2014 [place], [place].

## 2013-07-08 ENCOUNTER — Telehealth: Payer: Self-pay | Admitting: Internal Medicine

## 2013-07-08 NOTE — Telephone Encounter (Signed)
Relevant patient education assigned to patient using Emmi. ° °

## 2013-07-19 ENCOUNTER — Encounter: Payer: Self-pay | Admitting: *Deleted

## 2013-07-19 ENCOUNTER — Telehealth: Payer: Self-pay | Admitting: Internal Medicine

## 2013-07-19 NOTE — Telephone Encounter (Signed)
Pt states dr Raliegh Ip wrote her a note to be out of work last weekend for injury to back. Pt would like to know if he would write her another note to be out this weekend also, sat and sun? Pt's work fax: [place]: Juliann Pulse

## 2013-07-19 NOTE — Telephone Encounter (Signed)
ok 

## 2013-07-19 NOTE — Telephone Encounter (Signed)
Letter faxed and patient is aware

## 2013-07-25 ENCOUNTER — Encounter (INDEPENDENT_AMBULATORY_CARE_PROVIDER_SITE_OTHER): Payer: Self-pay

## 2013-07-29 ENCOUNTER — Ambulatory Visit (INDEPENDENT_AMBULATORY_CARE_PROVIDER_SITE_OTHER)
Admission: RE | Admit: 2013-07-29 | Discharge: 2013-07-29 | Disposition: A | Discharge status: Home / Self Care | Payer: BC Managed Care – PPO | Source: Ambulatory Visit | Attending: Internal Medicine | Admitting: Internal Medicine

## 2013-07-29 ENCOUNTER — Telehealth: Payer: Self-pay | Admitting: Internal Medicine

## 2013-07-29 DIAGNOSIS — R911 Solitary pulmonary nodule: Secondary | ICD-10-CM

## 2013-07-29 NOTE — Telephone Encounter (Signed)
Pt states she has taking all of her antibiotic that dr.k prescribed for her, pt was unsure of the name, however pt states she still has a really bad odor and the medicine did not work, want to know if dr. Raliegh Ip can call her in something else at [facility] [address].

## 2013-07-29 NOTE — Telephone Encounter (Signed)
Left message on voicemail to call office.  

## 2013-07-30 NOTE — Telephone Encounter (Signed)
Spoke to pt told her Dr. Raliegh Ip is out of the office will not be back till March 17 th and I can not order antibiotics without being seen. Pt verbalized understanding, c/o vaginal odor still present. Offered pt an appointment with another provider, but pt said she will wait. Appointment scheduled for March 17 at 4:00 pm with Dr. Raliegh Ip. Pt verbalized understanding.

## 2013-08-01 ENCOUNTER — Telehealth: Payer: Self-pay | Admitting: Internal Medicine

## 2013-08-01 ENCOUNTER — Ambulatory Visit (INDEPENDENT_AMBULATORY_CARE_PROVIDER_SITE_OTHER): Payer: BC Managed Care – PPO | Admitting: Physician Assistant

## 2013-08-01 ENCOUNTER — Encounter (INDEPENDENT_AMBULATORY_CARE_PROVIDER_SITE_OTHER): Payer: Self-pay

## 2013-08-01 VITALS — BP 154/90 | HR 86 | Temp 97.9°F | Resp 16 | Ht 61.0 in | Wt 223.6 lb

## 2013-08-01 DIAGNOSIS — Z4651 Encounter for fitting and adjustment of gastric lap band: Secondary | ICD-10-CM

## 2013-08-01 NOTE — Progress Notes (Signed)
  HISTORY: Lauren Campos is a 58 y.o.female who received an 10cm lap-band in July 2005 by Dr. Hassell Done. She comes in with 9 lbs weight gain since her last visit with him in April 2014. She had 0.5 mL removed then for obstructive symptoms. She has no further untoward symptoms but is complaining of excessive hunger and poor restriction.  VITAL SIGNS: Filed Vitals:   08/01/13 1500  BP: 154/90  Pulse: 86  Temp: 97.9 F (36.6 C)  Resp: 16    PHYSICAL EXAM: Physical exam reveals a very well-appearing 58 y.o.female in no apparent distress Neurologic: Awake, alert, oriented Psych: Bright affect, conversant Respiratory: Breathing even and unlabored. No stridor or wheezing Abdomen: Soft, nontender, nondistended to palpation. Incisions well-healed. No incisional hernias. Port easily palpated. Extremities: Atraumatic, good range of motion.  ASSESMENT: 58 y.o.  female  s/p 10cm lap-band.   PLAN: The patient's port was accessed with a 20G Huber needle without difficulty. Clear fluid was aspirated and 0.4 mL saline was added to the port to give a total predicted volume of 1.9 mL. The patient was able to swallow water without difficulty following the procedure and was instructed to take clear liquids for the next 24-48 hours and advance slowly as tolerated.

## 2013-08-01 NOTE — Patient Instructions (Signed)

## 2013-08-01 NOTE — Telephone Encounter (Signed)
Result Note     Ensure upcoming follupwup wit her over phone to discuss important result  ----  lmtcb x1 for pt

## 2013-08-02 ENCOUNTER — Encounter (INDEPENDENT_AMBULATORY_CARE_PROVIDER_SITE_OTHER): Payer: Self-pay | Admitting: Surgery

## 2013-08-02 ENCOUNTER — Ambulatory Visit (INDEPENDENT_AMBULATORY_CARE_PROVIDER_SITE_OTHER): Payer: BC Managed Care – PPO | Admitting: Surgery

## 2013-08-02 VITALS — BP 130/82 | HR 82 | Temp 98.7°F | Resp 14 | Ht 61.0 in | Wt 222.8 lb

## 2013-08-02 DIAGNOSIS — Z9884 Bariatric surgery status: Secondary | ICD-10-CM

## 2013-08-02 NOTE — Patient Instructions (Signed)

## 2013-08-02 NOTE — Progress Notes (Signed)
Lapband Fill Encounter Problem List:   Patient Active Problem List   Diagnosis Date Noted  . Pulmonary nodule, left 04/09/2013  . LAP-BAND surgery status 10cm band 7/19.05 08/24/2012  . Right ankle pain 07/09/2012  . Cellulitis 06/25/2012  . Contact dermatitis 11/24/2011  . Shoulder pain, left 11/24/2011  . DIVERTICULOSIS, COLON 08/10/2009  . CONSTIPATION, CHRONIC 08/10/2009  . HEMOPTYSIS 08/27/2008  . BREAST LUMP 09/05/2007  . HYPERLIPIDEMIA 05/08/2007  . MIGRAINE, COMMON 05/08/2007  . 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 HYPERLIPIDEMIA 07/20/2006  . OBESITY, NOS 07/20/2006  .   07/20/2006  . TOBACCO DEPENDENCE 07/20/2006  . BACK PAIN, LOW 07/20/2006  . INSOMNIA NOS 07/20/2006    Lisabeth Devoid Merlos Body mass index is 42.12 kg/(m^2). Weight loss since surgery  20  Having regurgitation?:  no  Feel that they need a fill?  Yes-she felt that she wasn't restricted enough after her fill yesterday.   Nocturnal reflux?  no  Amount of fill  0.25     Instructions given and weight loss goals discussed.    Drank water OK.  Advised to keep appt with [place] Chard  Matt B. Hassell Done, MD, FACS

## 2013-08-02 NOTE — Telephone Encounter (Signed)
Pt has an appt on 08-05-13 to review results. Pt aware. [place] Bing, CMA

## 2013-08-05 ENCOUNTER — Encounter: Payer: Self-pay | Admitting: Internal Medicine

## 2013-08-05 ENCOUNTER — Ambulatory Visit (INDEPENDENT_AMBULATORY_CARE_PROVIDER_SITE_OTHER): Payer: BC Managed Care – PPO | Admitting: Internal Medicine

## 2013-08-05 VITALS — BP 128/78 | HR 85 | Ht 61.0 in | Wt 219.6 lb

## 2013-08-05 DIAGNOSIS — F17200 Nicotine dependence, unspecified, uncomplicated: Secondary | ICD-10-CM

## 2013-08-05 DIAGNOSIS — R911 Solitary pulmonary nodule: Secondary | ICD-10-CM

## 2013-08-05 NOTE — Progress Notes (Signed)
Subjective:    Patient ID: Lauren Campos, female    DOB: 29-Apr-1956, 58 y.o.   MRN: [id number]  HPI   IOV 04/30/2013   Chief Complaint  Patient presents with  . Pulmonary Consult    for abnormal PET scan. Pt was having chest pain and SOB and this is why CXR was originally done.     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 Smokes half a pack a day since age 58. Reported to the emergency department on 04/04/2013 with cough. Had a CT scan of the chest that showed new-onset left upper lobe nodule she 0.9 mm in size compared to a CT scan of the chest in 2005 [I. personally reviewed the scan and confirmed this nodule]. This nodule is not spiculated. Currently she is asymptomatic without any shortness of breath or cough or hemoptysis or wheezing or edema or any radiation symptoms. The nodule was followed up by a PET scan today 04/30/2013 which did not show any uptake. She's been referred here for evaluation of pulmonary nodule. She is extremely worried that she might have lung cancer due to smoking history, age and family history of cancer with her dad having had prostate cancer and mother having had breast cancer. There is no associated weight loss   Nodule RElevant hx   reports that she has been smoking Cigarettes.  She has a 10 pack-year smoking history. She has never used smokeless tobacco.   EXAM:  CT CHEST WITHOUT CONTRAST 04/04/13 TECHNIQUE:  Multidetector CT imaging of the chest was performed following the  standard protocol without IV contrast.  COMPARISON: PA and lateral chest earlier this same day, 02/27/2013  hand 05/12/2009. CT chest 12/24/2003.  FINDINGS:  There is no axillary, hilar or mediastinal lymphadenopathy. Pleural  or pericardial effusion. Heart size is normal. The esophagus is  mildly dilated with an air-fluid level. Lap band is noted. Lungs  demonstrate fairly extensive centrilobular emphysematous disease.  Small focus of micro nodularity is identified in  the right lung apex  most consistent with prior inflammatory change. No consolidative  process is present.  There is a left upper lobe nodule measuring 0.9  cm in diameter on image 14. A 0.4 cm left upper lobe nodule is seen  image 19. The lungs are otherwise unremarkable. Incidentally imaged  upper abdomen demonstrates no focal abnormality. No focal bony  abnormality is identified.  IMPRESSION:  Negative for pneumonia.  0.9 cm left upper lobe nodule could be secondary to bronchogenic  carcinoma. PET CT scan is recommended for further evaluation.  Mildly dilated esophagus with an air-fluid level may be secondary to  the patient's lap band.  Electronically Signed  By: Inge Rise M.D.  On: 04/04/2013 16:40   PET scan 04/30/13 IMPRESSION:  1. The left upper lobe pulmonary nodule and additional smaller  pulmonary nodules do not show significantly increased metabolic  activity. This is a reassuring finding, although does not completely  exclude low grade neoplasm. Chest CT follow-up in 6 months  recommended.  2. No hypermetabolic nodal activity within the neck, chest, abdomen  or pelvis.  Electronically Signed  By: Camie Patience M.D.  On: 04/30/2013 13:24    REC Lung nodule Left upper lobe is small less than 1cm; new since 2005 Unlikely to be lung cancer due to ssmall size and negative PET scan but cannot be ruled out Please await to hear from Ms Grant Fontana about participation in research study for lung nodule Flu shot today Do Ct chest wo  contrast super-D protocol in 3 months from 04/30/2013 I will see you in 3 months with CT chest wo contrast  OV 08/05/2013 Chief Complaint  Patient presents with  . Follow-up    discuss CT scan.    Here to discuss CT results. CT scan of the chest 07/31/2013 shows enlarging left upper lobe nodule that is lobulated and more concerning for non-small cell lung cancer stage I. Overall she is doing stable except for mild dyspnea on exertion. She  also was exposed to mold in her house discovered by her, and there for she's left the house. She is having nonspecific tiredness and dizziness. She's planning to discuss this with her primary care physician   reports that she has been smoking Cigarettes.  She has a 10 pack-year smoking history. She has never used smokeless tobacco.   CLINICAL DATA: Followup of pulmonary nodule. History of hemoptysis.  Gastric banding. Obesity.  EXAM:  CT CHEST WITHOUT CONTRAST  07/29/13: TECHNIQUE:  Multidetector CT imaging of the chest was performed using thin slice  collimation for electromagnetic bronchoscopy planning purposes,  without intravenous contrast.  COMPARISON: DG RIBS UNILATERAL W/CHEST*R* dated 06/01/2013; NM PET  IMAGE INITIAL (PI) SKULL BASE TO THIGH dated 04/30/2013; CT CHEST W/O  CM dated 04/04/2013  FINDINGS:  Lungs/Pleura: Moderate centrilobular emphysema. Similar subpleural 3  mm right apical lung nodule on image 7.  Posterior left upper lobe pulmonary nodule measures 11 x 11 mm on  image 13 and has enlarged from the prior exam, where it measured 9 x  9 mm (when remeasured).  3 mm subpleural nodule in the left upper lobe on image 18 is less  conspicuous in on the prior exam. There is an adjacent more cephalad  3 mm nodule which is likely new but also favored to represent a  subpleural lymph node.  3 mm left lower lobe nodule on image 29 demonstrates central  cavitation and is not readily apparent on the prior.  Subpleural 3 mm superior segment left lower lobe nodule on image 13  is likely new. No pleural fluid.  Heart/Mediastinum: Normal heart size, without pericardial effusion.  No mediastinal or definite hilar adenopathy, given limitations of  unenhanced CT. Small hiatal hernia. Dilated esophagus with fluid  level within.  Upper Abdomen: Suspicion of a hypo attenuating left liver lobe  lesion at 1.6 cm on image 50/series 2. Likely similar on image 115  of the prior PET.  Indeterminate. Lap band. Normal imaged adrenal  glands. A right-sided fat containing Bochdalek's hernia.  Bones/Musculoskeletal: Multiple healing anterior right rib  fractures, new since 04/04/2013.  IMPRESSION:  1. Mild but definite enlargement of a left upper lobe pulmonary  nodule since 04/04/2013. This is highly suspicious for primary  bronchogenic carcinoma.  2. Centrilobular emphysema. Scattered smaller nodules. Some are  larger and some are smaller. Primarily felt to represent benign  entities such as subpleural lymph nodes. A tiny but cavitary left  lower lobe nodule warrant special followup attention.  3. Suspicion of a low-density left hepatic lobe lesion. This was  present on the 04/30/2013 PET and is not definitely hypermetabolic.  Recommend either attention on followup or definitive  characterization with dedicated abdominal MRI.  4. Lap band in place. Esophageal dilatation and fluid level could be  secondary or represent dysmotility.  5. Interval nonacute anterior right rib fractures.  Electronically Signed  By: Abigail Miyamoto M.D.  On: 07/29/2013 15:18  Review of Systems  Constitutional: Negative for fever and unexpected weight change.  HENT: Negative for congestion, dental problem, ear pain, nosebleeds, postnasal drip, rhinorrhea, sinus pressure, sneezing, sore throat and trouble swallowing.   Eyes: Negative for redness and itching.  Respiratory: Negative for cough, chest tightness, shortness of breath and wheezing.   Cardiovascular: Negative for palpitations and leg swelling.  Gastrointestinal: Negative for nausea and vomiting.  Genitourinary: Negative for dysuria.  Musculoskeletal: Negative for joint swelling.  Skin: Negative for rash.  Neurological: Negative for headaches.  Hematological: Does not bruise/bleed easily.  Psychiatric/Behavioral: Negative for dysphoric mood. The patient is not nervous/anxious.        Objective:   Physical Exam Filed Vitals:    08/05/13 1354  BP: 128/78  Pulse: 85  Height: 5\' 1"  (1.549 m)  Weight: 219 lb 9.6 oz (99.61 kg)  SpO2: 94%   Discussion only visit       Assessment & Plan:

## 2013-08-05 NOTE — Patient Instructions (Addendum)
Left upper lobe lung nodule - growing in size, more concerning for early stage 1 potentially curable lung cancer - do full PFT breathing test next few days - will discuss with my colleagues about doing lung biopsy versus straight surgery - quit smoking  Followup  will get back to you about next step

## 2013-08-06 ENCOUNTER — Ambulatory Visit (INDEPENDENT_AMBULATORY_CARE_PROVIDER_SITE_OTHER): Payer: BC Managed Care – PPO | Admitting: Internal Medicine

## 2013-08-06 ENCOUNTER — Ambulatory Visit (HOSPITAL_COMMUNITY)
Admission: RE | Admit: 2013-08-06 | Discharge: 2013-08-06 | Disposition: A | Discharge status: Home / Self Care | Payer: BC Managed Care – PPO | Source: Ambulatory Visit | Attending: Internal Medicine | Admitting: Internal Medicine

## 2013-08-06 ENCOUNTER — Encounter: Payer: Self-pay | Admitting: Internal Medicine

## 2013-08-06 VITALS — BP 120/78 | HR 91 | Temp 98.5°F | Resp 20 | Ht 61.0 in | Wt 221.0 lb

## 2013-08-06 DIAGNOSIS — Z0000 Encounter for general adult medical examination without abnormal findings: Secondary | ICD-10-CM

## 2013-08-06 DIAGNOSIS — R911 Solitary pulmonary nodule: Secondary | ICD-10-CM

## 2013-08-06 DIAGNOSIS — N951 Menopausal and female climacteric states: Secondary | ICD-10-CM

## 2013-08-06 LAB — PULMONARY FUNCTION TEST

## 2013-08-06 MED ORDER — ALBUTEROL SULFATE (2.5 MG/3ML) 0.083% IN NEBU
2.5000 mg | INHALATION_SOLUTION | Freq: Once | RESPIRATORY_TRACT | Status: AC
  Administered 2013-08-06: 2.5 mg via RESPIRATORY_TRACT

## 2013-08-06 NOTE — Progress Notes (Signed)
Pre-visit discussion using our clinic review tool. No additional management support is needed unless otherwise documented below in the visit note.  

## 2013-08-06 NOTE — Progress Notes (Signed)
Subjective:    Patient ID: Lauren Campos, female    DOB: 10-02-1955, 58 y.o.   MRN: [id number]  HPI  58 year old patient, who presents today with a chief complaint of a persistent vaginal odor.  She states this has been present for a six-months.  She reports no history of vaginal discharge.  She's had a remote hysterectomy.  She has had a normal clinical examination and has been treated empirically with both the antifungal's and metronidazole without even temporary benefit.  She has been reevaluated by pulmonary medicine, who has recently performed a followup CT scan to evaluate a left pulmonary nodule.  Prior evaluation has included a PET scan that was felt to be low risk for malignancy.  Followup chest CT unfortunately revealed slight enlargement and now there is concern for early bronchogenic carcinoma.  The patient does have a significant tobacco use, history  Past Medical History  Diagnosis Date  .   07/20/2006  . BACK PAIN, LOW 07/20/2006  . BREAST LUMP 09/05/2007  . CONSTIPATION, CHRONIC 08/10/2009  . DIVERTICULOSIS, COLON 08/10/2009  . Hemoptysis 08/27/2008  . HYPERLIPIDEMIA 05/08/2007  . HYPERLIPIDEMIA 07/20/2006  . INSOMNIA NOS 07/20/2006  . 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 MIGRAINE, COMMON 05/08/2007  . OBESITY, NOS 07/20/2006  . Pneumonia   . Pulmonary nodule     History   Social History  . Marital Status: Divorced    Spouse Name: N/A    Number of Children: N/A  . Years of Education: N/A   Occupational History  . Not on file.   Social History Main Topics  . Smoking status: Current Every Day Smoker -- 0.50 packs/day for 20 years    Types: Cigarettes  . Smokeless tobacco: Never Used  . Alcohol Use: No  . Drug Use: No  . Sexual Activity: Not on file   Other Topics Concern  . Not on file   Social History Narrative  . No narrative on file    Past Surgical History  Procedure Laterality Date  . Tubal ligation    . Abdominal hysterectomy    . Laparoscopic gastric banding       Family History  Problem Relation Age of Onset  . Cancer Mother   . Diabetes Mother   . Heart disease Mother   . Cancer Father   . Diabetes Sister     No Known Allergies  Current Outpatient Prescriptions on File Prior to Visit  Medication Sig Dispense Refill  . amitriptyline (ELAVIL) 100 MG tablet Take 1 tablet (100 mg total) by mouth at bedtime.  90 tablet  1  . ibuprofen (ADVIL,MOTRIN) 800 MG tablet Take 1 tablet (800 mg total) by mouth 3 (three) times daily.  90 tablet  3   No current facility-administered medications on file prior to visit.    BP 120/78  Pulse 91  Temp(Src) 98.5 F (36.9 C) (Oral)  Resp 20  Ht 5\' 1"  (1.549 m)  Wt 221 lb (100.245 kg)  BMI 41.78 kg/m2  SpO2 98%       Review of Systems  Genitourinary:       Chronic vaginal odor       Objective:   Physical Exam  Constitutional: She appears well-developed and well-nourished. No distress.  Blood pressure 120/78          Assessment & Plan:   Vaginal odor.  Patient has a normal clinical examination and has not improved with empiric treatment.  Will refer to gynecology, although doubt significant pathology Probable  early bronchogenic lung carcinoma.  Followup pulmonary medicine

## 2013-08-06 NOTE — Patient Instructions (Signed)
Followup pulmonary medicine Gynecologic evaluation as discussed  Call or return to clinic prn if these symptoms worsen or fail to improve as anticipated.

## 2013-08-11 ENCOUNTER — Telehealth: Payer: Self-pay | Admitting: Internal Medicine

## 2013-08-11 NOTE — Assessment & Plan Note (Signed)
0.9 mm left upper lobe pulmonary nodule noted on CT scan November 2014. PET negaitive when small. . ENlarged at 3 months followup March 2015 to 1.1cm and lobulated. High pretest prob for Stage 1A NSCLC . Discussed with [place] conference 08/08/13 and consensus is removal  PLAN Will call her and ;let her know she needs to meet with [facility] for removal Get PFT before she sees [facility] Might need CPST for preop eval depending on PFT result   > 50% of this > 25 min visit spent in face to face counseling (15 min visit converted to 25 min)

## 2013-08-11 NOTE — Telephone Encounter (Signed)
Let patient know I discussed at tumor conference 08/08/13 and because lesion is growing best felt that she needs surgical referral. Please refer her to thoracic surgeon (ensure PFT done before that); needs to see them < 1 week. She can discuss with surgeon what pro and con are. Let me know when you get appt with surgeon and who is so I can communicate directly with surgeon  Dr. Brand Males, M.D., [facility].C.P Pulmonary and Critical Care Medicine Staff Physician [place] [facility] Pager: [phone], If no answer or between  15:00h - 7:00h: call [phone]  08/11/2013 12:13 AM

## 2013-08-11 NOTE — Assessment & Plan Note (Signed)
Advised to  Quit. Will work on this more at a followup

## 2013-08-12 ENCOUNTER — Telehealth: Payer: Self-pay | Admitting: *Deleted

## 2013-08-12 NOTE — Telephone Encounter (Signed)
Called left vm message regarding appt.  Left my name and phone number to call

## 2013-08-12 NOTE — Telephone Encounter (Signed)
Pt called left vm message to call.  I called her back with more information regarding appt for [place].  She verbalized understanding of appt time and place

## 2013-08-15 ENCOUNTER — Encounter: Payer: Self-pay | Admitting: Thoracic Surgery (Cardiothoracic Vascular Surgery)

## 2013-08-15 ENCOUNTER — Encounter: Payer: Self-pay | Admitting: *Deleted

## 2013-08-15 ENCOUNTER — Ambulatory Visit: Payer: BC Managed Care – PPO | Admitting: Physical Therapy

## 2013-08-15 ENCOUNTER — Ambulatory Visit (INDEPENDENT_AMBULATORY_CARE_PROVIDER_SITE_OTHER): Payer: BC Managed Care – PPO | Admitting: Thoracic Surgery (Cardiothoracic Vascular Surgery)

## 2013-08-15 ENCOUNTER — Telehealth: Payer: Self-pay | Admitting: *Deleted

## 2013-08-15 VITALS — BP 125/84 | HR 87 | Temp 98.0°F | Resp 18 | Ht 61.0 in | Wt 215.8 lb

## 2013-08-15 DIAGNOSIS — R911 Solitary pulmonary nodule: Secondary | ICD-10-CM

## 2013-08-15 NOTE — Telephone Encounter (Signed)
Pt called left me vm message to call.  I called back.  Pt would like to know name of physician she is seeing today.  I gave her Dr. Leonarda Salon name.  She stated she will be at appt today.

## 2013-08-15 NOTE — Telephone Encounter (Signed)
I spoke with the pt and she saw Dr. Roxan Hockey on 08-15-13. [place] Bing, CMA

## 2013-08-15 NOTE — Progress Notes (Signed)
PCP is Nyoka Cowden, MD Referring Provider is Marletta Lor, MD  No chief complaint on file.   HPI:57 yo woman presents for evaluation of left upper lobe nodule  58 yo woman with a history of tobacco use(<1/2 ppd x 40 years). She developed a cough and neck swelling last November. She went to the ED. A CXR showed a left upper lobe nodule. No other source was found for her symptoms. Her neck swelling resolved but her cough has persisted off and on since then. She has not had hemoptysis.  The CXR finding led to a CT which showed an 8.8 mm nodule in the LUL. A PET was done. The nodule had an SUV of 1.8 which was equal to the background. She was advised to have another CT at 3 months. That CT was done and showed an increase in the size of the nodule to 11.3 x 10.7 mm.  She has not been exposed to TB. She has not been around birds or travelled to any unusual locations. She has had to move out of her house due to mold.   Past Medical History  Diagnosis Date  .   07/20/2006  . BACK PAIN, LOW 07/20/2006  . BREAST LUMP 09/05/2007  . CONSTIPATION, CHRONIC 08/10/2009  . DIVERTICULOSIS, COLON 08/10/2009  . Hemoptysis 08/27/2008  . HYPERLIPIDEMIA 05/08/2007  . HYPERLIPIDEMIA 07/20/2006  . INSOMNIA NOS 07/20/2006  . 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 MIGRAINE, COMMON 05/08/2007  . OBESITY, NOS 07/20/2006  . Pneumonia   . Pulmonary nodule     Past Surgical History  Procedure Laterality Date  . Tubal ligation    . Abdominal hysterectomy    . Laparoscopic gastric banding      Family History  Problem Relation Age of Onset  . Cancer Mother   . Diabetes Mother   . Heart disease Mother   . Cancer Father   . Diabetes Sister     Social History History  Substance Use Topics  . Smoking status: Current Every Day Smoker -- 0.50 packs/day for 20 years    Types: Cigarettes  . Smokeless tobacco: Never Used  . Alcohol Use: No    Current Outpatient Prescriptions  Medication Sig Dispense Refill   . amitriptyline (ELAVIL) 100 MG tablet Take 1 tablet (100 mg total) by mouth at bedtime.  90 tablet  1  . ibuprofen (ADVIL,MOTRIN) 800 MG tablet Take 1 tablet (800 mg total) by mouth 3 (three) times daily.  90 tablet  3   No current facility-administered medications for this visit.    No Known Allergies  Review of Systems  Constitutional: Negative for fever, chills, activity change, appetite change and unexpected weight change.  Respiratory: Positive for cough (no hemoptysis) and shortness of breath (with exertion).   Gastrointestinal:       Has had lap band for obesity  Genitourinary: Positive for vaginal discharge.  Musculoskeletal: Positive for back pain.  Neurological: Negative.   All other systems reviewed and are negative.    BP 125/84  Pulse 87  Temp(Src) 98 F (36.7 C)  Resp 18  Ht 5\' 1"  (1.549 m)  Wt 215 lb 12.8 oz (97.886 kg)  BMI 40.80 kg/m2 Physical Exam  Vitals reviewed. Constitutional: She is oriented to person, place, and time.  Obese  HENT:  Head: Normocephalic and atraumatic.  Eyes: EOM are normal. Pupils are equal, round, and reactive to light.  Cardiovascular: Normal rate and regular rhythm.   Murmur (2/6 systolic) heard. Pulmonary/Chest: Effort normal and  breath sounds normal. She has no wheezes. She has no rales.  Abdominal: Soft. There is no tenderness.  Musculoskeletal: She exhibits no edema.  Lymphadenopathy:    She has no cervical adenopathy.  Neurological: She is alert and oriented to person, place, and time. No cranial nerve deficit.  Skin: Skin is warm and dry.     Diagnostic Tests: CT CHEST WITHOUT CONTRAST 04/04/2013 TECHNIQUE:  Multidetector CT imaging of the chest was performed following the  standard protocol without IV contrast.  COMPARISON: PA and lateral chest earlier this same day, 02/27/2013  hand 05/12/2009. CT chest 12/24/2003.  FINDINGS:  There is no axillary, hilar or mediastinal lymphadenopathy. Pleural  or  pericardial effusion. Heart size is normal. The esophagus is  mildly dilated with an air-fluid level. Lap band is noted. Lungs  demonstrate fairly extensive centrilobular emphysematous disease.  Small focus of micro nodularity is identified in the right lung apex  most consistent with prior inflammatory change. No consolidative  process is present. There is a left upper lobe nodule measuring 0.9  cm in diameter on image 14. A 0.4 cm left upper lobe nodule is seen  image 19. The lungs are otherwise unremarkable. Incidentally imaged  upper abdomen demonstrates no focal abnormality. No focal bony  abnormality is identified.  IMPRESSION:  Negative for pneumonia.  0.9 cm left upper lobe nodule could be secondary to bronchogenic  carcinoma. PET CT scan is recommended for further evaluation.  Mildly dilated esophagus with an air-fluid level may be secondary to  the patient's lap band.  Electronically Signed  By: Inge Rise M.D.  On: 04/04/2013 16:40    NUCLEAR MEDICINE PET SKULL BASE TO THIGH  FASTING BLOOD GLUCOSE: Value: 82mg /dl  TECHNIQUE:  18.5 mCi F-18 FDG was injected intravenously. CT data was obtained  and used for attenuation correction and anatomic localization only.  (This was not acquired as a diagnostic CT examination.) Additional  exam technical data entered on technologist worksheet.  COMPARISON: Chest CT 04/04/2013.  FINDINGS:  NECK  No hypermetabolic lymph nodes in the neck.  CHEST  There are no hypermetabolic mediastinal, hilar or axillary lymph  nodes. The left upper lobe 9 mm pulmonary nodule does not show  significantly increased metabolic activity. This is seen on image 63  and has an SUV max of 1.8, similar to background soft tissue  activity. No enlarging pulmonary nodules are identified. Scattered  smaller nodules are present bilaterally. In addition moderate  emphysematous changes are present throughout both lungs.  ABDOMEN/PELVIS  There is no  abnormal metabolic activity within the liver, spleen,  adrenal glands or pancreas. There are no hypermetabolic abdominal  pelvic lymph nodes. A hiatal hernia and gastric laparoscopic band  are noted.  SKELETON  No focal hypermetabolic activity to suggest skeletal metastasis.  IMPRESSION:  1. The left upper lobe pulmonary nodule and additional smaller  pulmonary nodules do not show significantly increased metabolic  activity. This is a reassuring finding, although does not completely  exclude low grade neoplasm. Chest CT follow-up in 6 months  recommended.  2. No hypermetabolic nodal activity within the neck, chest, abdomen  or pelvis.  Electronically Signed  By: Camie Patience M.D.  On: 04/30/2013 13:24   CT CHEST WITHOUT CONTRAST 07/29/2013 TECHNIQUE:  Multidetector CT imaging of the chest was performed using thin slice  collimation for electromagnetic bronchoscopy planning purposes,  without intravenous contrast.  COMPARISON: DG RIBS UNILATERAL W/CHEST*R* dated 06/01/2013; NM PET  IMAGE INITIAL (PI) SKULL BASE TO THIGH dated  04/30/2013; CT CHEST W/O  CM dated 04/04/2013  FINDINGS:  Lungs/Pleura: Moderate centrilobular emphysema. Similar subpleural 3  mm right apical lung nodule on image 7.  Posterior left upper lobe pulmonary nodule measures 11 x 11 mm on  image 13 and has enlarged from the prior exam, where it measured 9 x  9 mm (when remeasured).  3 mm subpleural nodule in the left upper lobe on image 18 is less  conspicuous in on the prior exam. There is an adjacent more cephalad  3 mm nodule which is likely new but also favored to represent a  subpleural lymph node.  3 mm left lower lobe nodule on image 29 demonstrates central  cavitation and is not readily apparent on the prior.  Subpleural 3 mm superior segment left lower lobe nodule on image 13  is likely new. No pleural fluid.  Heart/Mediastinum: Normal heart size, without pericardial effusion.  No mediastinal or definite  hilar adenopathy, given limitations of  unenhanced CT. Small hiatal hernia. Dilated esophagus with fluid  level within.  Upper Abdomen: Suspicion of a hypo attenuating left liver lobe  lesion at 1.6 cm on image 50/series 2. Likely similar on image 115  of the prior PET. Indeterminate. Lap band. Normal imaged adrenal  glands. A right-sided fat containing Bochdalek's hernia.  Bones/Musculoskeletal: Multiple healing anterior right rib  fractures, new since 04/04/2013.  IMPRESSION:  1. Mild but definite enlargement of a left upper lobe pulmonary  nodule since 04/04/2013. This is highly suspicious for primary  bronchogenic carcinoma.  2. Centrilobular emphysema. Scattered smaller nodules. Some are  larger and some are smaller. Primarily felt to represent benign  entities such as subpleural lymph nodes. A tiny but cavitary left  lower lobe nodule warrant special followup attention.  3. Suspicion of a low-density left hepatic lobe lesion. This was  present on the 04/30/2013 PET and is not definitely hypermetabolic.  Recommend either attention on followup or definitive  characterization with dedicated abdominal MRI.  4. Lap band in place. Esophageal dilatation and fluid level could be  secondary or represent dysmotility.  5. Interval nonacute anterior right rib fractures.  Electronically Signed  By: Abigail Miyamoto M.D.  On: 07/29/2013 15:18  PFT 08/05/2013  FVC 2.53(105%) FEV1 2.11(112%) DLCO 79%  Impression: 58 yo woman with a history of moderate tobacco use. She was found to have a LUL nodule in November. It was not felt to be hypermetabolic on PET, but was < 1 cm. The nodule has increased in size over a 3 month period which is concerning. The differential diagnosis includes cancer and fungal or mycobacterial infection. We discussed the possible etiologies as well as ways to go about making the diagnosis. I emphasized that this had to be considered lung cancer unless it can be proven  otherwise.   IMO the best option is to proceed directly to left VATS and wedge resection for definitive diagnosis. The lesion is in the posterior superior LUL and should be amenable to wedge resection. That will give use the definitive diagnosis. If benign no further resection would be necessary. If malignant we would proceed with a left upper lobectomy(or possibly segmentectomy) at the same setting providing definitive treatment. Bronchoscopic or Ct guided biopsies would both have a relatively high false negative rate.  I have discussed with the patient and her friend the general nature of the procedure, the need for general anesthesia, and the incisions to be used. I discussed the expected hospital stay, overall recovery and short and  long term outcomes. They understand the risks include, but are not limited to death, stroke, MI, DVT/PE, bleeding, possible need for transfusion, infections, prolonged air leaks, and other organ system dysfunction including respiratory, renal, or GI complications.   She accepts the risks and agrees to proceed  Plan:  Left VATS, wedge resection, possible left upper lobectomy on Monday 08/26/13

## 2013-08-16 ENCOUNTER — Other Ambulatory Visit: Payer: Self-pay

## 2013-08-16 DIAGNOSIS — D381 Neoplasm of uncertain behavior of trachea, bronchus and lung: Secondary | ICD-10-CM

## 2013-08-19 ENCOUNTER — Encounter (HOSPITAL_COMMUNITY): Payer: Self-pay | Admitting: Pharmacy Technician

## 2013-08-21 ENCOUNTER — Ambulatory Visit: Payer: BC Managed Care – PPO | Admitting: Obstetrics & Gynecology

## 2013-08-21 NOTE — Pre-Procedure Instructions (Signed)
IKEA DEMICCO  08/21/2013   Your procedure is scheduled on: Monday, August 26, 2013 at  7:30 AM  Report to [facility] (use Main Entrance "A'') at 5:30 AM.  Call this number if you have problems the morning of surgery: [phone]   Remember:   Do not eat food or drink liquids after midnight.   Take these medicines the morning of surgery with A SIP OF WATER: NONE Stop taking Aspirin and herbal medications. Do not take any NSAIDs ie: Ibuprofen, Advil, Naproxen or any medication containing Aspirin  Do not wear jewelry, make-up or nail polish.  Do not wear lotions, powders, or perfumes. You may  NOT wear deodorant.  Do not shave 48 hours prior to surgery.  Do not bring valuables to the hospital.  [facility] is not responsible for any belongings or valuables.               Contacts, dentures or bridgework may not be worn into surgery.  Leave suitcase in the car. After surgery it may be brought to your room.  For patients admitted to the hospital, discharge time is determined by your treatment team.               Patients discharged the day of surgery will not be allowed to drive home.  Name and phone number of your driver:   Special Instructions:  Special Instructions:Special Instructions: [facility] - Preparing for Surgery  Before surgery, you can play an important role.  Because skin is not sterile, your skin needs to be as free of germs as possible.  You can reduce the number of germs on you skin by washing with CHG (chlorahexidine gluconate) soap before surgery.  CHG is an antiseptic cleaner which kills germs and bonds with the skin to continue killing germs even after washing.  Please DO NOT use if you have an allergy to CHG or antibacterial soaps.  If your skin becomes reddened/irritated stop using the CHG and inform your nurse when you arrive at Short Stay.  Do not shave (including legs and underarms) for at least 48 hours prior to the first CHG shower.  You may shave  your face.  Please follow these instructions carefully:   1.  Shower with CHG Soap the night before surgery and the morning of Surgery.  2.  If you choose to wash your hair, wash your hair first as usual with your normal shampoo.  3.  After you shampoo, rinse your hair and body thoroughly to remove the Shampoo.  4.  Use CHG as you would any other liquid soap.  You can apply chg directly  to the skin and wash gently with scrungie or a clean washcloth.  5.  Apply the CHG Soap to your body ONLY FROM THE NECK DOWN.  Do not use on open wounds or open sores.  Avoid contact with your eyes, ears, mouth and genitals (private parts).  Wash genitals (private parts) with your normal soap.  6.  Wash thoroughly, paying special attention to the area where your surgery will be performed.  7.  Thoroughly rinse your body with warm water from the neck down.  8.  DO NOT shower/wash with your normal soap after using and rinsing off the CHG Soap.  9.  Pat yourself dry with a clean towel.            10.  Wear clean pajamas.  11.  Place clean sheets on your bed the night of your first shower and do not sleep with pets.  Day of Surgery  Do not apply any lotions/deodorants the morning of surgery.  Please wear clean clothes to the hospital/surgery center.   Please read over the following fact sheets that you were given: Pain Booklet, Coughing and Deep Breathing, Blood Transfusion Information, MRSA Information and Surgical Site Infection Prevention

## 2013-08-22 ENCOUNTER — Encounter (HOSPITAL_COMMUNITY)
Admission: RE | Admit: 2013-08-22 | Discharge: 2013-08-22 | Disposition: A | Discharge status: Home / Self Care | Payer: BC Managed Care – PPO | Source: Ambulatory Visit | Attending: Thoracic Surgery (Cardiothoracic Vascular Surgery) | Admitting: Thoracic Surgery (Cardiothoracic Vascular Surgery)

## 2013-08-22 ENCOUNTER — Encounter (HOSPITAL_COMMUNITY): Payer: Self-pay

## 2013-08-22 VITALS — BP 117/79 | HR 97 | Temp 98.2°F | Resp 20 | Ht 61.0 in | Wt 213.9 lb

## 2013-08-22 DIAGNOSIS — Z01810 Encounter for preprocedural cardiovascular examination: Secondary | ICD-10-CM | POA: Insufficient documentation

## 2013-08-22 DIAGNOSIS — Z01812 Encounter for preprocedural laboratory examination: Secondary | ICD-10-CM | POA: Insufficient documentation

## 2013-08-22 DIAGNOSIS — D381 Neoplasm of uncertain behavior of trachea, bronchus and lung: Secondary | ICD-10-CM

## 2013-08-22 HISTORY — DX: Gastro-esophageal reflux disease without esophagitis: K21.9

## 2013-08-22 HISTORY — DX: Cardiac murmur, unspecified: R01.1

## 2013-08-22 LAB — URINE MICROSCOPIC-ADD ON

## 2013-08-22 LAB — COMPREHENSIVE METABOLIC PANEL
ALK PHOS: 102 U/L (ref 39–117)
ALT: 18 U/L (ref 0–35)
AST: 23 U/L (ref 0–37)
Albumin: 3.6 g/dL (ref 3.5–5.2)
BUN: 13 mg/dL (ref 6–23)
CHLORIDE: 100 meq/L (ref 96–112)
CO2: 25 meq/L (ref 19–32)
CREATININE: 0.65 mg/dL (ref 0.50–1.10)
Calcium: 9.9 mg/dL (ref 8.4–10.5)
GLUCOSE: 111 mg/dL — AB (ref 70–99)
POTASSIUM: 4 meq/L (ref 3.7–5.3)
Sodium: 140 mEq/L (ref 137–147)
Total Bilirubin: <0.2 mg/dL — ABNORMAL LOW (ref 0.3–1.2)
Total Protein: 6.9 g/dL (ref 6.0–8.3)

## 2013-08-22 LAB — CBC
HCT: 39.5 % (ref 36.0–46.0)
HEMOGLOBIN: 13 g/dL (ref 12.0–15.0)
MCH: 30.5 pg (ref 26.0–34.0)
MCHC: 32.9 g/dL (ref 30.0–36.0)
MCV: 92.7 fL (ref 78.0–100.0)
Platelets: 321 10*3/uL (ref 150–400)
RBC: 4.26 MIL/uL (ref 3.87–5.11)
RDW: 14.1 % (ref 11.5–15.5)
WBC: 6.5 10*3/uL (ref 4.0–10.5)

## 2013-08-22 LAB — BLOOD GAS, ARTERIAL
ACID-BASE EXCESS: 5.9 mmol/L — AB (ref 0.0–2.0)
BICARBONATE: 30.2 meq/L — AB (ref 20.0–24.0)
Drawn by: 344381
FIO2: 0.21 %
O2 Saturation: 95.2 %
PCO2 ART: 46.6 mmHg — AB (ref 35.0–45.0)
Patient temperature: 98.6
TCO2: 31.6 mmol/L (ref 0–100)
pH, Arterial: 7.427 (ref 7.350–7.450)
pO2, Arterial: 74.6 mmHg — ABNORMAL LOW (ref 80.0–100.0)

## 2013-08-22 LAB — URINALYSIS, ROUTINE W REFLEX MICROSCOPIC
Bilirubin Urine: NEGATIVE
Glucose, UA: NEGATIVE mg/dL
Hgb urine dipstick: NEGATIVE
KETONES UR: NEGATIVE mg/dL
NITRITE: NEGATIVE
PH: 7 (ref 5.0–8.0)
PROTEIN: NEGATIVE mg/dL
Specific Gravity, Urine: 1.022 (ref 1.005–1.030)
Urobilinogen, UA: 0.2 mg/dL (ref 0.0–1.0)

## 2013-08-22 LAB — SURGICAL PCR SCREEN
MRSA, PCR: NEGATIVE
STAPHYLOCOCCUS AUREUS: NEGATIVE

## 2013-08-22 LAB — PROTIME-INR
INR: 0.98 (ref 0.00–1.49)
PROTHROMBIN TIME: 12.8 s (ref 11.6–15.2)

## 2013-08-22 LAB — TYPE AND SCREEN
ABO/RH(D): O POS
Antibody Screen: NEGATIVE

## 2013-08-22 LAB — APTT: aPTT: 30 seconds (ref 24–37)

## 2013-08-22 LAB — ABO/RH: ABO/RH(D): O POS

## 2013-08-23 NOTE — Progress Notes (Addendum)
Anesthesia Chart Review:  Patient is a 57 year old female scheduled for left VATS, wedge resection, possible lobectomy for left upper lobe lung nodule on 08/26/13 by Dr. Roxan Hockey. History includes smoking hyperlipidemia migraines, anxiety, GERD, heart murmur, hemoptysis '10, laparoscopic gastric banding, hysterectomy. BMI 40.44 consistent with morbid obesity. PCP is listed as Dr. Salli Real. Pulmonologist is Dr. Chase Caller.  EKG on 08/22/13 showed NSR.  Nuclear stress test on 05/13/09 showed: 1. Negative for pharmacologic-stress induced ischemia.  2. Left ventricular ejection fraction 59%.  Normal LVEF, mild LV wall thickness, trivial MR, mild PR/TR by echo in 2005.  According to Dr. Leonarda Salon 08/15/13 office note,   PFT 08/05/2013  FVC 2.53(105%)  FEV1 2.11(112%)  DLCO 79%   Preoperative labs noted.  She is for CXR on arrival.  [facility] Short Stay Center/Anesthesiology Phone [phone] 08/23/2013 9:18 AM

## 2013-08-25 MED ORDER — DEXTROSE 5 % IV SOLN
1.5000 g | INTRAVENOUS | Status: AC
  Administered 2013-08-26: 1.5 g via INTRAVENOUS
  Filled 2013-08-25: qty 1.5

## 2013-08-26 ENCOUNTER — Inpatient Hospital Stay (HOSPITAL_COMMUNITY): Payer: BC Managed Care – PPO

## 2013-08-26 ENCOUNTER — Observation Stay (HOSPITAL_COMMUNITY): Payer: BC Managed Care – PPO

## 2013-08-26 ENCOUNTER — Inpatient Hospital Stay (HOSPITAL_COMMUNITY): Payer: BC Managed Care – PPO | Admitting: Anesthesiology

## 2013-08-26 ENCOUNTER — Observation Stay (HOSPITAL_COMMUNITY)
Admission: RE | Admit: 2013-08-26 | Discharge: 2013-08-27 | Disposition: A | Discharge status: Home / Self Care | Payer: BC Managed Care – PPO | Source: Ambulatory Visit | Attending: Thoracic Surgery (Cardiothoracic Vascular Surgery) | Admitting: Thoracic Surgery (Cardiothoracic Vascular Surgery)

## 2013-08-26 ENCOUNTER — Encounter (HOSPITAL_COMMUNITY): Payer: Self-pay | Admitting: Anesthesiology

## 2013-08-26 ENCOUNTER — Encounter (HOSPITAL_COMMUNITY): Payer: BC Managed Care – PPO | Admitting: Vascular Surgery

## 2013-08-26 ENCOUNTER — Encounter (HOSPITAL_COMMUNITY)
Admission: RE | Disposition: A | Discharge status: Home / Self Care | Payer: Self-pay | Source: Ambulatory Visit | Attending: Thoracic Surgery (Cardiothoracic Vascular Surgery)

## 2013-08-26 DIAGNOSIS — F411 Generalized anxiety disorder: Secondary | ICD-10-CM | POA: Insufficient documentation

## 2013-08-26 DIAGNOSIS — G4700 Insomnia, unspecified: Secondary | ICD-10-CM | POA: Insufficient documentation

## 2013-08-26 DIAGNOSIS — D381 Neoplasm of uncertain behavior of trachea, bronchus and lung: Secondary | ICD-10-CM

## 2013-08-26 DIAGNOSIS — C3490 Malignant neoplasm of unspecified part of unspecified bronchus or lung: Secondary | ICD-10-CM

## 2013-08-26 DIAGNOSIS — Z9884 Bariatric surgery status: Secondary | ICD-10-CM | POA: Insufficient documentation

## 2013-08-26 DIAGNOSIS — G43009 Migraine without aura, not intractable, without status migrainosus: Secondary | ICD-10-CM | POA: Insufficient documentation

## 2013-08-26 DIAGNOSIS — R222 Localized swelling, mass and lump, trunk: Secondary | ICD-10-CM

## 2013-08-26 DIAGNOSIS — T17208A Unspecified foreign body in pharynx causing other injury, initial encounter: Secondary | ICD-10-CM | POA: Insufficient documentation

## 2013-08-26 DIAGNOSIS — J4489 Other specified chronic obstructive pulmonary disease: Secondary | ICD-10-CM | POA: Insufficient documentation

## 2013-08-26 DIAGNOSIS — Z23 Encounter for immunization: Secondary | ICD-10-CM | POA: Insufficient documentation

## 2013-08-26 DIAGNOSIS — K219 Gastro-esophageal reflux disease without esophagitis: Secondary | ICD-10-CM | POA: Insufficient documentation

## 2013-08-26 DIAGNOSIS — E785 Hyperlipidemia, unspecified: Secondary | ICD-10-CM | POA: Insufficient documentation

## 2013-08-26 DIAGNOSIS — IMO0002 Reserved for concepts with insufficient information to code with codable children: Secondary | ICD-10-CM | POA: Insufficient documentation

## 2013-08-26 DIAGNOSIS — F17200 Nicotine dependence, unspecified, uncomplicated: Secondary | ICD-10-CM | POA: Insufficient documentation

## 2013-08-26 DIAGNOSIS — K5730 Diverticulosis of large intestine without perforation or abscess without bleeding: Secondary | ICD-10-CM | POA: Insufficient documentation

## 2013-08-26 DIAGNOSIS — T17408A Unspecified foreign body in trachea causing other injury, initial encounter: Secondary | ICD-10-CM | POA: Insufficient documentation

## 2013-08-26 DIAGNOSIS — Z6841 Body Mass Index (BMI) 40.0 and over, adult: Secondary | ICD-10-CM | POA: Insufficient documentation

## 2013-08-26 DIAGNOSIS — J449 Chronic obstructive pulmonary disease, unspecified: Secondary | ICD-10-CM | POA: Insufficient documentation

## 2013-08-26 DIAGNOSIS — T17508A Unspecified foreign body in bronchus causing other injury, initial encounter: Secondary | ICD-10-CM | POA: Insufficient documentation

## 2013-08-26 HISTORY — DX: Malignant neoplasm of unspecified part of unspecified bronchus or lung: C34.90

## 2013-08-26 HISTORY — PX: BRONCHOSCOPY: SUR163

## 2013-08-26 HISTORY — PX: VIDEO BRONCHOSCOPY: SHX5072

## 2013-08-26 SURGERY — BRONCHOSCOPY, VIDEO-ASSISTED
Anesthesia: General | Site: Chest

## 2013-08-26 MED ORDER — DEXAMETHASONE SODIUM PHOSPHATE 10 MG/ML IJ SOLN
INTRAMUSCULAR | Status: DC | PRN
  Administered 2013-08-26: 10 mg via INTRAVENOUS

## 2013-08-26 MED ORDER — DEXAMETHASONE SODIUM PHOSPHATE 10 MG/ML IJ SOLN
INTRAMUSCULAR | Status: AC
  Filled 2013-08-26: qty 1

## 2013-08-26 MED ORDER — ACETAMINOPHEN 325 MG PO TABS
650.0000 mg | ORAL_TABLET | Freq: Four times a day (QID) | ORAL | Status: DC | PRN
  Administered 2013-08-26: 650 mg via ORAL
  Filled 2013-08-26: qty 2

## 2013-08-26 MED ORDER — ROCURONIUM BROMIDE 50 MG/5ML IV SOLN
INTRAVENOUS | Status: AC
  Filled 2013-08-26: qty 1

## 2013-08-26 MED ORDER — PROPOFOL 10 MG/ML IV BOLUS
INTRAVENOUS | Status: AC
  Filled 2013-08-26: qty 20

## 2013-08-26 MED ORDER — LACTATED RINGERS IV SOLN
INTRAVENOUS | Status: DC | PRN
  Administered 2013-08-26: 06:00:00 via INTRAVENOUS

## 2013-08-26 MED ORDER — METOCLOPRAMIDE HCL 5 MG/5ML PO SOLN
10.0000 mg | Freq: Three times a day (TID) | ORAL | Status: DC
  Administered 2013-08-26 – 2013-08-27 (×2): 10 mg via ORAL
  Filled 2013-08-26 (×3): qty 10

## 2013-08-26 MED ORDER — ONDANSETRON HCL 4 MG/2ML IJ SOLN: 4.0000 mg | Freq: Four times a day (QID) | INTRAMUSCULAR | Status: DC | PRN

## 2013-08-26 MED ORDER — ENOXAPARIN SODIUM 40 MG/0.4ML ~~LOC~~ SOLN
40.0000 mg | SUBCUTANEOUS | Status: DC
  Administered 2013-08-27: 40 mg via SUBCUTANEOUS
  Filled 2013-08-26: qty 0.4

## 2013-08-26 MED ORDER — ALBUTEROL SULFATE (2.5 MG/3ML) 0.083% IN NEBU: 2.5000 mg | INHALATION_SOLUTION | RESPIRATORY_TRACT | Status: DC | PRN

## 2013-08-26 MED ORDER — AMITRIPTYLINE HCL 100 MG PO TABS
100.0000 mg | ORAL_TABLET | Freq: Every day | ORAL | Status: DC
  Administered 2013-08-26: 100 mg via ORAL
  Filled 2013-08-26 (×2): qty 1

## 2013-08-26 MED ORDER — ALUM & MAG HYDROXIDE-SIMETH 200-200-20 MG/5ML PO SUSP: 30.0000 mL | Freq: Four times a day (QID) | ORAL | Status: DC | PRN

## 2013-08-26 MED ORDER — PNEUMOCOCCAL VAC POLYVALENT 25 MCG/0.5ML IJ INJ
0.5000 mL | INJECTION | INTRAMUSCULAR | Status: AC
  Administered 2013-08-27: 0.5 mL via INTRAMUSCULAR
  Filled 2013-08-26: qty 0.5

## 2013-08-26 MED ORDER — ACETAMINOPHEN 650 MG RE SUPP: 650.0000 mg | Freq: Four times a day (QID) | RECTAL | Status: DC | PRN

## 2013-08-26 MED ORDER — ARTIFICIAL TEARS OP OINT
TOPICAL_OINTMENT | OPHTHALMIC | Status: AC
  Filled 2013-08-26: qty 3.5

## 2013-08-26 MED ORDER — HYDROMORPHONE HCL PF 1 MG/ML IJ SOLN
0.2500 mg | INTRAMUSCULAR | Status: DC | PRN
  Administered 2013-08-26 (×4): 0.5 mg via INTRAVENOUS

## 2013-08-26 MED ORDER — LIDOCAINE HCL (CARDIAC) 20 MG/ML IV SOLN
INTRAVENOUS | Status: DC | PRN
  Administered 2013-08-26: 100 mg via INTRAVENOUS

## 2013-08-26 MED ORDER — HYDROMORPHONE HCL PF 1 MG/ML IJ SOLN
INTRAMUSCULAR | Status: AC
  Administered 2013-08-26: 0.5 mg via INTRAVENOUS
  Filled 2013-08-26: qty 1

## 2013-08-26 MED ORDER — ROCURONIUM BROMIDE 100 MG/10ML IV SOLN
INTRAVENOUS | Status: DC | PRN
  Administered 2013-08-26: 50 mg via INTRAVENOUS

## 2013-08-26 MED ORDER — PROMETHAZINE HCL 25 MG/ML IJ SOLN: 6.2500 mg | INTRAMUSCULAR | Status: DC | PRN

## 2013-08-26 MED ORDER — LIDOCAINE HCL (CARDIAC) 20 MG/ML IV SOLN
INTRAVENOUS | Status: AC
  Filled 2013-08-26: qty 5

## 2013-08-26 MED ORDER — FENTANYL CITRATE 0.05 MG/ML IJ SOLN: 50.0000 ug | Freq: Once | INTRAMUSCULAR | Status: DC

## 2013-08-26 MED ORDER — PROPOFOL 10 MG/ML IV BOLUS
INTRAVENOUS | Status: DC | PRN
  Administered 2013-08-26: 200 mg via INTRAVENOUS

## 2013-08-26 MED ORDER — FENTANYL CITRATE 0.05 MG/ML IJ SOLN
INTRAMUSCULAR | Status: DC | PRN
  Administered 2013-08-26: 50 ug via INTRAVENOUS
  Administered 2013-08-26: 100 ug via INTRAVENOUS
  Administered 2013-08-26: 50 ug via INTRAVENOUS

## 2013-08-26 MED ORDER — GLYCOPYRROLATE 0.2 MG/ML IJ SOLN
INTRAMUSCULAR | Status: DC | PRN
  Administered 2013-08-26: 1 mg via INTRAVENOUS

## 2013-08-26 MED ORDER — LACTATED RINGERS IV SOLN
INTRAVENOUS | Status: DC | PRN
  Administered 2013-08-26: 07:00:00 via INTRAVENOUS

## 2013-08-26 MED ORDER — ONDANSETRON HCL 4 MG PO TABS: 4.0000 mg | ORAL_TABLET | Freq: Four times a day (QID) | ORAL | Status: DC | PRN

## 2013-08-26 MED ORDER — ALBUTEROL SULFATE HFA 108 (90 BASE) MCG/ACT IN AERS
INHALATION_SPRAY | RESPIRATORY_TRACT | Status: AC
  Filled 2013-08-26: qty 6.7

## 2013-08-26 MED ORDER — ONDANSETRON HCL 4 MG/2ML IJ SOLN
INTRAMUSCULAR | Status: AC
  Filled 2013-08-26: qty 2

## 2013-08-26 MED ORDER — FENTANYL CITRATE 0.05 MG/ML IJ SOLN
INTRAMUSCULAR | Status: AC
  Filled 2013-08-26: qty 5

## 2013-08-26 MED ORDER — MIDAZOLAM HCL 5 MG/5ML IJ SOLN
INTRAMUSCULAR | Status: DC | PRN
  Administered 2013-08-26: 2 mg via INTRAVENOUS

## 2013-08-26 MED ORDER — OXYCODONE HCL 5 MG/5ML PO SOLN: 5.0000 mg | Freq: Once | ORAL | Status: DC | PRN

## 2013-08-26 MED ORDER — PHENYLEPHRINE HCL 10 MG/ML IJ SOLN
10.0000 mg | INTRAVENOUS | Status: DC | PRN
  Administered 2013-08-26: 10 ug/min via INTRAVENOUS

## 2013-08-26 MED ORDER — NEOSTIGMINE METHYLSULFATE 1 MG/ML IJ SOLN
INTRAMUSCULAR | Status: DC | PRN
  Administered 2013-08-26: 5 mg via INTRAVENOUS

## 2013-08-26 MED ORDER — MIDAZOLAM HCL 2 MG/2ML IJ SOLN
INTRAMUSCULAR | Status: AC
  Filled 2013-08-26: qty 2

## 2013-08-26 MED ORDER — ARTIFICIAL TEARS OP OINT
TOPICAL_OINTMENT | OPHTHALMIC | Status: DC | PRN
  Administered 2013-08-26: 1 via OPHTHALMIC

## 2013-08-26 MED ORDER — 0.9 % SODIUM CHLORIDE (POUR BTL) OPTIME
TOPICAL | Status: DC | PRN
  Administered 2013-08-26 (×2): 1000 mL

## 2013-08-26 MED ORDER — DEXTROSE-NACL 5-0.9 % IV SOLN
INTRAVENOUS | Status: DC
  Administered 2013-08-26 – 2013-08-27 (×2): via INTRAVENOUS

## 2013-08-26 MED ORDER — PIPERACILLIN-TAZOBACTAM 3.375 G IVPB
3.3750 g | Freq: Four times a day (QID) | INTRAVENOUS | Status: AC
  Administered 2013-08-26 – 2013-08-27 (×4): 3.375 g via INTRAVENOUS
  Filled 2013-08-26 (×6): qty 50

## 2013-08-26 MED ORDER — METOCLOPRAMIDE HCL 5 MG/ML IJ SOLN
INTRAMUSCULAR | Status: DC | PRN
  Administered 2013-08-26: 10 mg via INTRAVENOUS

## 2013-08-26 MED ORDER — NALOXONE HCL 0.4 MG/ML IJ SOLN
INTRAMUSCULAR | Status: DC | PRN
Start: 2013-08-26 — End: 2013-08-26
  Administered 2013-08-26 (×2): 100 ug via INTRAVENOUS

## 2013-08-26 MED ORDER — DOCUSATE SODIUM 100 MG PO CAPS
100.0000 mg | ORAL_CAPSULE | Freq: Two times a day (BID) | ORAL | Status: DC
  Administered 2013-08-26 – 2013-08-27 (×3): 100 mg via ORAL
  Filled 2013-08-26 (×3): qty 1

## 2013-08-26 MED ORDER — OXYCODONE HCL 5 MG PO TABS: 5.0000 mg | ORAL_TABLET | Freq: Once | ORAL | Status: DC | PRN

## 2013-08-26 MED ORDER — ONDANSETRON HCL 4 MG/2ML IJ SOLN
INTRAMUSCULAR | Status: DC | PRN
  Administered 2013-08-26: 4 mg via INTRAVENOUS

## 2013-08-26 MED ORDER — GUAIFENESIN 100 MG/5ML PO SYRP
200.0000 mg | ORAL_SOLUTION | ORAL | Status: DC | PRN
  Administered 2013-08-26 – 2013-08-27 (×2): 200 mg via ORAL
  Filled 2013-08-26 (×3): qty 10

## 2013-08-26 MED ORDER — MIDAZOLAM HCL 2 MG/2ML IJ SOLN
1.0000 mg | INTRAMUSCULAR | Status: DC | PRN
  Filled 2013-08-26: qty 2

## 2013-08-26 SURGICAL SUPPLY — 73 items
BENZOIN TINCTURE PRP APPL 2/3 (GAUZE/BANDAGES/DRESSINGS) ×4 IMPLANT
CANISTER SUCTION 2500CC (MISCELLANEOUS) ×8 IMPLANT
CATH KIT ON Q 5IN SLV (PAIN MANAGEMENT) IMPLANT
CATH THORACIC 28FR (CATHETERS) IMPLANT
CATH THORACIC 36FR (CATHETERS) IMPLANT
CATH THORACIC 36FR RT ANG (CATHETERS) IMPLANT
CLIP TI MEDIUM 6 (CLIP) ×4 IMPLANT
CONT SPEC 4OZ CLIKSEAL STRL BL (MISCELLANEOUS) ×8 IMPLANT
COVER SURGICAL LIGHT HANDLE (MISCELLANEOUS) ×4 IMPLANT
DERMABOND ADVANCED (GAUZE/BANDAGES/DRESSINGS)
DERMABOND ADVANCED .7 DNX12 (GAUZE/BANDAGES/DRESSINGS) IMPLANT
DRAIN CHANNEL 28F RND 3/8 FF (WOUND CARE) IMPLANT
DRAIN CHANNEL 32F RND 10.7 FF (WOUND CARE) IMPLANT
DRAPE LAPAROSCOPIC ABDOMINAL (DRAPES) ×4 IMPLANT
DRAPE WARM FLUID 44X44 (DRAPE) ×4 IMPLANT
ELECT REM PT RETURN 9FT ADLT (ELECTROSURGICAL) ×4
ELECTRODE REM PT RTRN 9FT ADLT (ELECTROSURGICAL) ×2 IMPLANT
FORCEPS RADIAL JAW LRG 4 PULM (INSTRUMENTS) ×2 IMPLANT
GLOVE BIOGEL PI IND STRL 6.5 (GLOVE) ×4 IMPLANT
GLOVE BIOGEL PI INDICATOR 6.5 (GLOVE) ×4
GLOVE ECLIPSE 6.5 STRL STRAW (GLOVE) ×4 IMPLANT
GLOVE SURG SIGNA 7.5 PF LTX (GLOVE) ×8 IMPLANT
GOWN STRL REUS W/ TWL LRG LVL3 (GOWN DISPOSABLE) ×4 IMPLANT
GOWN STRL REUS W/ TWL XL LVL3 (GOWN DISPOSABLE) ×4 IMPLANT
GOWN STRL REUS W/TWL LRG LVL3 (GOWN DISPOSABLE) ×4
GOWN STRL REUS W/TWL XL LVL3 (GOWN DISPOSABLE) ×4
HANDLE STAPLE ENDO GIA SHORT (STAPLE)
HEMOSTAT SURGICEL 2X14 (HEMOSTASIS) IMPLANT
KIT BASIN OR (CUSTOM PROCEDURE TRAY) ×4 IMPLANT
KIT ROOM TURNOVER OR (KITS) ×4 IMPLANT
KIT SUCTION CATH 14FR (SUCTIONS) ×4 IMPLANT
NS IRRIG 1000ML POUR BTL (IV SOLUTION) ×8 IMPLANT
PACK CHEST (CUSTOM PROCEDURE TRAY) ×4 IMPLANT
PAD ARMBOARD 7.5X6 YLW CONV (MISCELLANEOUS) ×8 IMPLANT
POUCH ENDO CATCH II 15MM (MISCELLANEOUS) IMPLANT
POUCH SPECIMEN RETRIEVAL 10MM (ENDOMECHANICALS) IMPLANT
RADIAL JAW LRG 4 PULMONARY (INSTRUMENTS) ×2
SEALANT PROGEL (MISCELLANEOUS) IMPLANT
SEALANT SURG COSEAL 4ML (VASCULAR PRODUCTS) IMPLANT
SEALANT SURG COSEAL 8ML (VASCULAR PRODUCTS) IMPLANT
SOLUTION ANTI FOG 6CC (MISCELLANEOUS) ×4 IMPLANT
SPECIMEN JAR MEDIUM (MISCELLANEOUS) ×4 IMPLANT
SPONGE GAUZE 4X4 12PLY (GAUZE/BANDAGES/DRESSINGS) ×4 IMPLANT
STAPLER ENDO GIA 12MM SHORT (STAPLE) IMPLANT
SUT PROLENE 4 0 RB 1 (SUTURE)
SUT PROLENE 4-0 RB1 .5 CRCL 36 (SUTURE) IMPLANT
SUT SILK  1 MH (SUTURE) ×2
SUT SILK 1 MH (SUTURE) ×2 IMPLANT
SUT SILK 2 0SH CR/8 30 (SUTURE) IMPLANT
SUT SILK 3 0SH CR/8 30 (SUTURE) IMPLANT
SUT VIC AB 0 CTX 27 (SUTURE) IMPLANT
SUT VIC AB 1 CTX 27 (SUTURE) IMPLANT
SUT VIC AB 2-0 CT1 27 (SUTURE)
SUT VIC AB 2-0 CT1 TAPERPNT 27 (SUTURE) IMPLANT
SUT VIC AB 2-0 CTX 36 (SUTURE) IMPLANT
SUT VIC AB 3-0 MH 27 (SUTURE) IMPLANT
SUT VIC AB 3-0 SH 27 (SUTURE)
SUT VIC AB 3-0 SH 27X BRD (SUTURE) IMPLANT
SUT VIC AB 3-0 X1 27 (SUTURE) ×8 IMPLANT
SUT VICRYL 0 UR6 27IN ABS (SUTURE) ×8 IMPLANT
SUT VICRYL 2 TP 1 (SUTURE) IMPLANT
SWAB COLLECTION DEVICE MRSA (MISCELLANEOUS) IMPLANT
SYSTEM SAHARA CHEST DRAIN ATS (WOUND CARE) ×4 IMPLANT
TIP APPLICATOR SPRAY EXTEND 16 (VASCULAR PRODUCTS) IMPLANT
TOWEL OR 17X24 6PK STRL BLUE (TOWEL DISPOSABLE) ×4 IMPLANT
TOWEL OR 17X26 10 PK STRL BLUE (TOWEL DISPOSABLE) ×8 IMPLANT
TRAP SPECIMEN MUCOUS 40CC (MISCELLANEOUS) IMPLANT
TRAY FOLEY CATH 16FRSI W/METER (SET/KITS/TRAYS/PACK) ×4 IMPLANT
TROCAR XCEL BLADELESS 5X75MML (TROCAR) ×4 IMPLANT
TROCAR XCEL NON-BLD 5MMX100MML (ENDOMECHANICALS) IMPLANT
TUBE ANAEROBIC SPECIMEN COL (MISCELLANEOUS) IMPLANT
TUNNELER SHEATH ON-Q 11GX8 DSP (PAIN MANAGEMENT) IMPLANT
WATER STERILE IRR 1000ML POUR (IV SOLUTION) ×8 IMPLANT

## 2013-08-26 NOTE — Interval H&P Note (Signed)
History and Physical Interval Note:  08/26/2013 7:20 AM  Lauren Campos  has presented today for surgery, with the diagnosis of left lung nodule  The various methods of treatment have been discussed with the patient and family. After consideration of risks, benefits and other options for treatment, the patient has consented to  Procedure(s) with comments: [place] (VATS)/WEDGE RESECTION (Left) - left VATS/wedge resection/possible lobectomy as a surgical intervention .  The patient's history has been reviewed, patient examined, no change in status, stable for surgery.  I have reviewed the patient's chart and labs.  Questions were answered to the patient's satisfaction.     Raneem Mendolia C

## 2013-08-26 NOTE — H&P (View-Only) (Signed)
PCP is Nyoka Cowden, MD Referring Provider is Marletta Lor, MD  No chief complaint on file.   HPI:57 yo woman presents for evaluation of left upper lobe nodule  58 yo woman with a history of tobacco use(<1/2 ppd x 40 years). She developed a cough and neck swelling last November. She went to the ED. A CXR showed a left upper lobe nodule. No other source was found for her symptoms. Her neck swelling resolved but her cough has persisted off and on since then. She has not had hemoptysis.  The CXR finding led to a CT which showed an 8.8 mm nodule in the LUL. A PET was done. The nodule had an SUV of 1.8 which was equal to the background. She was advised to have another CT at 3 months. That CT was done and showed an increase in the size of the nodule to 11.3 x 10.7 mm.  She has not been exposed to TB. She has not been around birds or travelled to any unusual locations. She has had to move out of her house due to mold.   Past Medical History  Diagnosis Date  .   07/20/2006  . BACK PAIN, LOW 07/20/2006  . BREAST LUMP 09/05/2007  . CONSTIPATION, CHRONIC 08/10/2009  . DIVERTICULOSIS, COLON 08/10/2009  . Hemoptysis 08/27/2008  . HYPERLIPIDEMIA 05/08/2007  . HYPERLIPIDEMIA 07/20/2006  . INSOMNIA NOS 07/20/2006  . 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 MIGRAINE, COMMON 05/08/2007  . OBESITY, NOS 07/20/2006  . Pneumonia   . Pulmonary nodule     Past Surgical History  Procedure Laterality Date  . Tubal ligation    . Abdominal hysterectomy    . Laparoscopic gastric banding      Family History  Problem Relation Age of Onset  . Cancer Mother   . Diabetes Mother   . Heart disease Mother   . Cancer Father   . Diabetes Sister     Social History History  Substance Use Topics  . Smoking status: Current Every Day Smoker -- 0.50 packs/day for 20 years    Types: Cigarettes  . Smokeless tobacco: Never Used  . Alcohol Use: No    Current Outpatient Prescriptions  Medication Sig Dispense Refill   . amitriptyline (ELAVIL) 100 MG tablet Take 1 tablet (100 mg total) by mouth at bedtime.  90 tablet  1  . ibuprofen (ADVIL,MOTRIN) 800 MG tablet Take 1 tablet (800 mg total) by mouth 3 (three) times daily.  90 tablet  3   No current facility-administered medications for this visit.    No Known Allergies  Review of Systems  Constitutional: Negative for fever, chills, activity change, appetite change and unexpected weight change.  Respiratory: Positive for cough (no hemoptysis) and shortness of breath (with exertion).   Gastrointestinal:       Has had lap band for obesity  Genitourinary: Positive for vaginal discharge.  Musculoskeletal: Positive for back pain.  Neurological: Negative.   All other systems reviewed and are negative.    BP 125/84  Pulse 87  Temp(Src) 98 F (36.7 C)  Resp 18  Ht 5\' 1"  (1.549 m)  Wt 215 lb 12.8 oz (97.886 kg)  BMI 40.80 kg/m2 Physical Exam  Vitals reviewed. Constitutional: She is oriented to person, place, and time.  Obese  HENT:  Head: Normocephalic and atraumatic.  Eyes: EOM are normal. Pupils are equal, round, and reactive to light.  Cardiovascular: Normal rate and regular rhythm.   Murmur (2/6 systolic) heard. Pulmonary/Chest: Effort normal and  breath sounds normal. She has no wheezes. She has no rales.  Abdominal: Soft. There is no tenderness.  Musculoskeletal: She exhibits no edema.  Lymphadenopathy:    She has no cervical adenopathy.  Neurological: She is alert and oriented to person, place, and time. No cranial nerve deficit.  Skin: Skin is warm and dry.     Diagnostic Tests: CT CHEST WITHOUT CONTRAST 04/04/2013 TECHNIQUE:  Multidetector CT imaging of the chest was performed following the  standard protocol without IV contrast.  COMPARISON: PA and lateral chest earlier this same day, 02/27/2013  hand 05/12/2009. CT chest 12/24/2003.  FINDINGS:  There is no axillary, hilar or mediastinal lymphadenopathy. Pleural  or  pericardial effusion. Heart size is normal. The esophagus is  mildly dilated with an air-fluid level. Lap band is noted. Lungs  demonstrate fairly extensive centrilobular emphysematous disease.  Small focus of micro nodularity is identified in the right lung apex  most consistent with prior inflammatory change. No consolidative  process is present. There is a left upper lobe nodule measuring 0.9  cm in diameter on image 14. A 0.4 cm left upper lobe nodule is seen  image 19. The lungs are otherwise unremarkable. Incidentally imaged  upper abdomen demonstrates no focal abnormality. No focal bony  abnormality is identified.  IMPRESSION:  Negative for pneumonia.  0.9 cm left upper lobe nodule could be secondary to bronchogenic  carcinoma. PET CT scan is recommended for further evaluation.  Mildly dilated esophagus with an air-fluid level may be secondary to  the patient's lap band.  Electronically Signed  By: Inge Rise M.D.  On: 04/04/2013 16:40    NUCLEAR MEDICINE PET SKULL BASE TO THIGH  FASTING BLOOD GLUCOSE: Value: 82mg /dl  TECHNIQUE:  18.5 mCi F-18 FDG was injected intravenously. CT data was obtained  and used for attenuation correction and anatomic localization only.  (This was not acquired as a diagnostic CT examination.) Additional  exam technical data entered on technologist worksheet.  COMPARISON: Chest CT 04/04/2013.  FINDINGS:  NECK  No hypermetabolic lymph nodes in the neck.  CHEST  There are no hypermetabolic mediastinal, hilar or axillary lymph  nodes. The left upper lobe 9 mm pulmonary nodule does not show  significantly increased metabolic activity. This is seen on image 63  and has an SUV max of 1.8, similar to background soft tissue  activity. No enlarging pulmonary nodules are identified. Scattered  smaller nodules are present bilaterally. In addition moderate  emphysematous changes are present throughout both lungs.  ABDOMEN/PELVIS  There is no  abnormal metabolic activity within the liver, spleen,  adrenal glands or pancreas. There are no hypermetabolic abdominal  pelvic lymph nodes. A hiatal hernia and gastric laparoscopic band  are noted.  SKELETON  No focal hypermetabolic activity to suggest skeletal metastasis.  IMPRESSION:  1. The left upper lobe pulmonary nodule and additional smaller  pulmonary nodules do not show significantly increased metabolic  activity. This is a reassuring finding, although does not completely  exclude low grade neoplasm. Chest CT follow-up in 6 months  recommended.  2. No hypermetabolic nodal activity within the neck, chest, abdomen  or pelvis.  Electronically Signed  By: Camie Patience M.D.  On: 04/30/2013 13:24   CT CHEST WITHOUT CONTRAST 07/29/2013 TECHNIQUE:  Multidetector CT imaging of the chest was performed using thin slice  collimation for electromagnetic bronchoscopy planning purposes,  without intravenous contrast.  COMPARISON: DG RIBS UNILATERAL W/CHEST*R* dated 06/01/2013; NM PET  IMAGE INITIAL (PI) SKULL BASE TO THIGH dated  04/30/2013; CT CHEST W/O  CM dated 04/04/2013  FINDINGS:  Lungs/Pleura: Moderate centrilobular emphysema. Similar subpleural 3  mm right apical lung nodule on image 7.  Posterior left upper lobe pulmonary nodule measures 11 x 11 mm on  image 13 and has enlarged from the prior exam, where it measured 9 x  9 mm (when remeasured).  3 mm subpleural nodule in the left upper lobe on image 18 is less  conspicuous in on the prior exam. There is an adjacent more cephalad  3 mm nodule which is likely new but also favored to represent a  subpleural lymph node.  3 mm left lower lobe nodule on image 29 demonstrates central  cavitation and is not readily apparent on the prior.  Subpleural 3 mm superior segment left lower lobe nodule on image 13  is likely new. No pleural fluid.  Heart/Mediastinum: Normal heart size, without pericardial effusion.  No mediastinal or definite  hilar adenopathy, given limitations of  unenhanced CT. Small hiatal hernia. Dilated esophagus with fluid  level within.  Upper Abdomen: Suspicion of a hypo attenuating left liver lobe  lesion at 1.6 cm on image 50/series 2. Likely similar on image 115  of the prior PET. Indeterminate. Lap band. Normal imaged adrenal  glands. A right-sided fat containing Bochdalek's hernia.  Bones/Musculoskeletal: Multiple healing anterior right rib  fractures, new since 04/04/2013.  IMPRESSION:  1. Mild but definite enlargement of a left upper lobe pulmonary  nodule since 04/04/2013. This is highly suspicious for primary  bronchogenic carcinoma.  2. Centrilobular emphysema. Scattered smaller nodules. Some are  larger and some are smaller. Primarily felt to represent benign  entities such as subpleural lymph nodes. A tiny but cavitary left  lower lobe nodule warrant special followup attention.  3. Suspicion of a low-density left hepatic lobe lesion. This was  present on the 04/30/2013 PET and is not definitely hypermetabolic.  Recommend either attention on followup or definitive  characterization with dedicated abdominal MRI.  4. Lap band in place. Esophageal dilatation and fluid level could be  secondary or represent dysmotility.  5. Interval nonacute anterior right rib fractures.  Electronically Signed  By: Abigail Miyamoto M.D.  On: 07/29/2013 15:18  PFT 08/05/2013  FVC 2.53(105%) FEV1 2.11(112%) DLCO 79%  Impression: 58 yo woman with a history of moderate tobacco use. She was found to have a LUL nodule in November. It was not felt to be hypermetabolic on PET, but was < 1 cm. The nodule has increased in size over a 3 month period which is concerning. The differential diagnosis includes cancer and fungal or mycobacterial infection. We discussed the possible etiologies as well as ways to go about making the diagnosis. I emphasized that this had to be considered lung cancer unless it can be proven  otherwise.   IMO the best option is to proceed directly to left VATS and wedge resection for definitive diagnosis. The lesion is in the posterior superior LUL and should be amenable to wedge resection. That will give use the definitive diagnosis. If benign no further resection would be necessary. If malignant we would proceed with a left upper lobectomy(or possibly segmentectomy) at the same setting providing definitive treatment. Bronchoscopic or Ct guided biopsies would both have a relatively high false negative rate.  I have discussed with the patient and her friend the general nature of the procedure, the need for general anesthesia, and the incisions to be used. I discussed the expected hospital stay, overall recovery and short and  long term outcomes. They understand the risks include, but are not limited to death, stroke, MI, DVT/PE, bleeding, possible need for transfusion, infections, prolonged air leaks, and other organ system dysfunction including respiratory, renal, or GI complications.   She accepts the risks and agrees to proceed  Plan:  Left VATS, wedge resection, possible left upper lobectomy on Monday 08/26/13

## 2013-08-26 NOTE — Anesthesia Preprocedure Evaluation (Addendum)
Anesthesia Evaluation  Patient identified by MRN, date of birth, ID band Patient awake    Reviewed: Allergy & Precautions, H&P , NPO status , Patient's Chart, lab work & pertinent test results  Airway Mallampati: II TM Distance: <3 FB Neck ROM: Full    Dental  (+) Upper Dentures, Partial Lower, Dental Advisory Given, Teeth Intact   Pulmonary pneumonia -, resolved, COPDCurrent Smoker,  pulm nodule + rhonchi         Cardiovascular + Valvular Problems/Murmurs Rhythm:Regular Rate:Normal     Neuro/Psych  Headaches, PSYCHIATRIC DISORDERS Anxiety    GI/Hepatic GERD-  Controlled,  Endo/Other  Morbid obesity  Renal/GU      Musculoskeletal   Abdominal (+) + obese,   Peds  Hematology   Anesthesia Other Findings Pt. Has issues with reflux at night only. Dentures removed; remaining teeth on bottom intact.  Reproductive/Obstetrics                          Anesthesia Physical Anesthesia Plan  ASA: III  Anesthesia Plan: General   Post-op Pain Management:    Induction: Intravenous  Airway Management Planned: Double Lumen EBT  Additional Equipment: Arterial line  Intra-op Plan:   Post-operative Plan: Extubation in OR  Informed Consent: I have reviewed the patients History and Physical, chart, labs and discussed the procedure including the risks, benefits and alternatives for the proposed anesthesia with the patient or authorized representative who has indicated his/her understanding and acceptance.     Plan Discussed with: CRNA and Surgeon  Anesthesia Plan Comments:         Anesthesia Quick Evaluation

## 2013-08-26 NOTE — Transfer of Care (Signed)
Immediate Anesthesia Transfer of Care Note  Patient: Lauren Campos  Procedure(s) Performed: Procedure(s): VIDEO BRONCHOSCOPY (N/A)  Patient Location: PACU  Anesthesia Type:General  Level of Consciousness: lethargic and responds to stimulation  Airway & Oxygen Therapy: Patient Spontanous Breathing and Patient connected to face mask oxygen  Post-op Assessment: Report given to PACU RN and Post -op Vital signs reviewed and stable  Post vital signs: Reviewed and stable  Complications: No apparent anesthesia complications

## 2013-08-26 NOTE — OR Nursing (Signed)
Dr Chriss Driver  performed anesthesia bronchoscopy after intubation with food particles returned. Dr Roxan Hockey in and performed Bronchoscopy then cancelled VATS

## 2013-08-26 NOTE — Anesthesia Procedure Notes (Addendum)
Procedure Name: Intubation Date/Time: 08/26/2013 7:46 AM Performed by: Storm Frisk E Pre-anesthesia Checklist: Patient identified, Emergency Drugs available, Suction available, Patient being monitored and Timeout performed Patient Re-evaluated:Patient Re-evaluated prior to inductionOxygen Delivery Method: Circle system utilized Preoxygenation: Pre-oxygenation with 100% oxygen Intubation Type: IV induction Ventilation: Mask ventilation without difficulty Laryngoscope Size: Mac and 3 Grade View: Grade III Tube type: Oral Endobronchial tube: Left and Double lumen EBT and 37 Fr Laser Tube: Cuffed inflated with minimal occlusive pressure - saline Number of attempts: 2 Airway Equipment and Method: Stylet Placement Confirmation: ETT inserted through vocal cords under direct vision,  positive ETCO2 and breath sounds checked- equal and bilateral Secured at: 29 cm Tube secured with: Tape Dental Injury: Teeth and Oropharynx as per pre-operative assessment  Comments: 1 attempt by Storm Frisk, CRNA in which excessive amounts of yellow-green gastric, undigested contents were suctioned from the oropharynx.  The cords were unable to be visualized.  Dr. Chriss Driver intubated the patient.  The ETT was immediately suctioned as well as an OGT inserted in which 150 mL of contents suctioned.  Dr. Roxan Hockey paged and bronchoscopy performed.

## 2013-08-26 NOTE — Progress Notes (Signed)
Patient awakened when phone rang.  Patient states she is hurting all over and needs something for pain, wants something for cough and needs something to eat since she is not having her surgery.  I notified Dr. Roxan Hockey, orders received.

## 2013-08-26 NOTE — Anesthesia Postprocedure Evaluation (Signed)
  Anesthesia Post-op Note  Patient: Lauren Campos  Procedure(s) Performed: Procedure(s): VIDEO BRONCHOSCOPY (N/A)  Patient Location: PACU  Anesthesia Type:General  Level of Consciousness: awake and alert   Airway and Oxygen Therapy: Patient Spontanous Breathing  Post-op Pain: mild  Post-op Assessment: Post-op Vital signs reviewed, Patient's Cardiovascular Status Stable, Respiratory Function Stable, Patent Airway, No signs of Nausea or vomiting and Pain level controlled  Post-op Vital Signs: Reviewed and stable  Complications: respiratory complications

## 2013-08-26 NOTE — Brief Op Note (Signed)
08/26/2013  12:07 PM  PATIENT:  Lauren Campos  58 y.o. female  PRE-OPERATIVE DIAGNOSIS:  left lung nodule  POST-OPERATIVE DIAGNOSIS:  left lung nodule  PROCEDURE:  Procedure(s): VIDEO BRONCHOSCOPY (N/A), removal of foreign bodies  SURGEON:  Surgeon(s) and Role:    * Melrose Nakayama, MD - Primary   ANESTHESIA:   general  EBL:  Total I/O In: 1000 [I.V.:1000] Out: -   BLOOD ADMINISTERED:none  DRAINS: none   LOCAL MEDICATIONS USED:  NONE  SPECIMEN:  Source of Specimen:  foreign body  DISPOSITION OF SPECIMEN:  PATHOLOGY  PLAN OF CARE: Admit for overnight observation  PATIENT DISPOSITION:  PACU - hemodynamically stable.   Delay start of Pharmacological VTE agent (>24hrs) due to surgical blood loss or risk of bleeding: not applicable  Patient aspirated a large amount of gastric contents on induction. Bronchoscopy performed to clear airways but not advisable to proceed with lung resection under these circumstances

## 2013-08-26 NOTE — Progress Notes (Signed)
Aline removed per protocol.  Site U.

## 2013-08-27 ENCOUNTER — Observation Stay (HOSPITAL_COMMUNITY): Payer: BC Managed Care – PPO

## 2013-08-27 ENCOUNTER — Telehealth: Payer: Self-pay | Admitting: Internal Medicine

## 2013-08-27 LAB — CBC
HCT: 34.4 % — ABNORMAL LOW (ref 36.0–46.0)
Hemoglobin: 11.2 g/dL — ABNORMAL LOW (ref 12.0–15.0)
MCH: 30.2 pg (ref 26.0–34.0)
MCHC: 32.6 g/dL (ref 30.0–36.0)
MCV: 92.7 fL (ref 78.0–100.0)
PLATELETS: 275 10*3/uL (ref 150–400)
RBC: 3.71 MIL/uL — ABNORMAL LOW (ref 3.87–5.11)
RDW: 14 % (ref 11.5–15.5)
WBC: 16.3 10*3/uL — ABNORMAL HIGH (ref 4.0–10.5)

## 2013-08-27 LAB — BASIC METABOLIC PANEL
BUN: 7 mg/dL (ref 6–23)
CALCIUM: 9 mg/dL (ref 8.4–10.5)
CO2: 28 mEq/L (ref 19–32)
CREATININE: 0.61 mg/dL (ref 0.50–1.10)
Chloride: 101 mEq/L (ref 96–112)
GFR calc non Af Amer: >90 mL/min (ref >90)
Glucose, Bld: 129 mg/dL — ABNORMAL HIGH (ref 70–99)
Potassium: 3.1 mEq/L — ABNORMAL LOW (ref 3.7–5.3)
Sodium: 141 mEq/L (ref 137–147)

## 2013-08-27 MED ORDER — POTASSIUM CHLORIDE ER 10 MEQ PO TBCR
40.0000 meq | EXTENDED_RELEASE_TABLET | Freq: Once | ORAL | Status: AC
  Administered 2013-08-27: 40 meq via ORAL
  Filled 2013-08-27 (×2): qty 4

## 2013-08-27 MED ORDER — AMOXICILLIN-POT CLAVULANATE 875-125 MG PO TABS
1.0000 | ORAL_TABLET | Freq: Two times a day (BID) | ORAL | Status: DC
  Filled 2013-08-27: qty 1

## 2013-08-27 MED ORDER — AMOXICILLIN-POT CLAVULANATE 875-125 MG PO TABS: 1.0000 | ORAL_TABLET | Freq: Two times a day (BID) | ORAL | Status: DC

## 2013-08-27 MED ORDER — POTASSIUM CHLORIDE 10 MEQ/50ML IV SOLN
10.0000 meq | INTRAVENOUS | Status: DC
  Filled 2013-08-27 (×3): qty 50

## 2013-08-27 MED ORDER — POTASSIUM CHLORIDE 10 MEQ/100ML IV SOLN
10.0000 meq | INTRAVENOUS | Status: DC
  Administered 2013-08-27: 10 meq via INTRAVENOUS
  Filled 2013-08-27: qty 100

## 2013-08-27 NOTE — Telephone Encounter (Signed)
The [phone] is  Her number at the hospital. Her phone is disconnected. She will not have a phone after lunch because she will be leaving the hospital. Please call her before.

## 2013-08-27 NOTE — Telephone Encounter (Signed)
Per SN---  She would likely have to go to [facility] or [facility]  Recs for an appt with MR to discuss and follow up on aspiration.  thanks

## 2013-08-27 NOTE — Progress Notes (Signed)
Discharge instructions reviewed with patient, questions answered, verbalized understanding.  Patient given written prescriptions with instructions to take complete course of antibiotics, verbalized understanding.  Patient ambulatory to main entrance of hospital to be taken home by nephew.  Patient in good condition at time of discharge from [facility].

## 2013-08-27 NOTE — Telephone Encounter (Signed)
ATC PT line rang several times and no answer wcb

## 2013-08-27 NOTE — Progress Notes (Signed)
1 Day Post-Op Procedure(s) (LRB): VIDEO BRONCHOSCOPY (N/A) Subjective: No complaints this AM, wants to go home Denies nausea, cough, wheeze, SOB  Objective: Vital signs in last 24 hours: Temp:  [97.8 F (36.6 C)-98.4 F (36.9 C)] 98.1 F (36.7 C) (04/07 0607) Pulse Rate:  [73-97] 85 (04/07 0607) Cardiac Rhythm:  [-] Normal sinus rhythm (04/06 1316) Resp:  [12-28] 18 (04/07 0607) BP: (112-153)/(62-80) 112/64 mmHg (04/07 0607) SpO2:  [94 %-100 %] 94 % (04/07 0607) Arterial Line BP: (130-177)/(57-73) 131/59 mmHg (04/06 1300) FiO2 (%):  [28 %] 28 % (04/06 1343)  Hemodynamic parameters for last 24 hours:    Intake/Output from previous day: 04/06 0701 - 04/07 0700 In: 2414.7 [P.O.:180; I.V.:2234.7] Out: 1200 [Urine:1200] Intake/Output this shift:    General appearance: alert and no distress Lungs: clear to auscultation bilaterally  Lab Results:  Recent Labs  08/27/13 0520  WBC 16.3*  HGB 11.2*  HCT 34.4*  PLT 275   BMET:  Recent Labs  08/27/13 0520  NA 141  K 3.1*  CL 101  CO2 28  GLUCOSE 129*  BUN 7  CREATININE 0.61  CALCIUM 9.0    PT/INR: No results found for this basename: LABPROT, INR,  in the last 72 hours ABG    Component Value Date/Time   PHART 7.427 08/22/2013 1020   HCO3 30.2* 08/22/2013 1020   TCO2 31.6 08/22/2013 1020   O2SAT 95.2 08/22/2013 1020   CBG (last 3)  No results found for this basename: GLUCAP,  in the last 72 hours  Assessment/Plan: S/P Procedure(s) (LRB): VIDEO BRONCHOSCOPY (N/A) Plan for discharge: see discharge orders Will advance diet Dc central line Supplement K Change to PO antibiotics- complete a 1 week course Dc home later today Follow up in 1 week to reschedule surgery   LOS: 1 day    Trude Cansler C 08/27/2013

## 2013-08-27 NOTE — Telephone Encounter (Signed)
Called made pt aware of SN recs. She did not agree on going to [facility] for this. She reports that makes no sense. She wanted this sent to another doc but advised her he is the doc on call. She will wait for MR to return. Please advise thanks

## 2013-08-27 NOTE — Discharge Summary (Signed)
[address]       [place] [address]             [phone]              Discharge Summary  Name: Lauren Campos DOB: 1956-04-23 58 y.o. MRN: [id number]   Admission Date: 08/26/2013 Discharge Date: 08/27/2013    Admitting Diagnosis: Left upper lobe mass   Discharge Diagnosis:  Left upper lobe mass Aspiration on anesthesia induction  Past Medical History  Diagnosis Date  .   07/20/2006  . BACK PAIN, LOW 07/20/2006  . BREAST LUMP 09/05/2007    "benign"  . CONSTIPATION, CHRONIC 08/10/2009  . DIVERTICULOSIS, COLON 08/10/2009  . Hemoptysis 08/27/2008  . HYPERLIPIDEMIA 05/08/2007  . HYPERLIPIDEMIA     "not anymore" (08/26/2013)  . INSOMNIA NOS 07/20/2006  . 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 OBESITY, NOS 07/20/2006  . Pulmonary nodule   . Heart murmur   . GERD (gastroesophageal reflux disease)   . Pneumonia 2014    "once" (08/26/2013)  . MIGRAINE, COMMON 05/08/2007    "haven't had one in ~ 6 months" (08/26/2013)  . Lung cancer       Procedures: VIDEO BRONCHOSCOPY, REMOVAL OF FOREIGN BODIES - 08/26/2013   HPI:  The patient is a 58 y.o. female with a history of tobacco use (<1/2 ppd x 40 years). She developed a cough and neck swelling last November. She went to the ED for further evaluation. A chest x-ray showed a left upper lobe nodule. No other source was found for her symptoms. Her neck swelling resolved but her cough has persisted off and on since then. She has not had hemoptysis. The chest x-ray finding led to a CT which showed an 8.8 mm nodule in the left upper lobe. A PET scan was performed, which showed the nodule had an SUV of 1.8, which was equal to the background. She was advised to have another CT at 3 months. That CT was done and showed an increase in the size of the nodule to 11.3 x 10.7 mm.  She has not been exposed to TB. She has not been around birds or travelled to any unusual locations. She has had to move out of her house due to mold. She was  referred to Dr. Dorris Fetch for thoracic surgical evaluation.  Because the nodule had increased in size over a 3 month period, it was recommended that she proceed with a VATS /wedge resection for diagnosis. All risks, benefits and alternatives of surgery were explained in detail, and the patient agreed to proceed.    Hospital Course:  The patient was admitted to [facility] on 08/26/2013. The patient was taken to the operating room for the planned procedure.  Upon induction of general anesthesia, the patient vomited large amounts of gastric contents in the pharynx and trachea.  She underwent a bronchoscopy by Dr. Dorris Fetch, and multiple foreign bodies were extracted from the tracheobronchial tree.   After the airways had been cleared as much as possible, the patient was awakened, extubated, and taken to the postanesthesia care unit for further evaluation.  She was subsequently admitted for overnight observation.  She has remained stable while in the hospital.  A clear liquid diet was started, and was advanced as tolerated to a regular diet.  She denies nausea, vomiting or dysphagia.  Chest x-ray remained stable with no evidence of edema or infiltrate.  The patient remained afebrile, and vital signs and labs were stable.  She was evaluated on today's date and was deemed medically stable for discharge home on a 7 day course of po antibiotics.    Recent vital signs:  Filed Vitals:   08/27/13 0607  BP: 112/64  Pulse: 85  Temp: 98.1 F (36.7 C)  Resp: 18    Recent laboratory studies:  CBC: Recent Labs  08/27/13 0520  WBC 16.3*  HGB 11.2*  HCT 34.4*  PLT 275   BMET:  Recent Labs  08/27/13 0520  NA 141  K 3.1*  CL 101  CO2 28  GLUCOSE 129*  BUN 7  CREATININE 0.61  CALCIUM 9.0    PT/INR: No results found for this basename: LABPROT, INR,  in the last 72 hours   Discharge Medications:     Medication List         amitriptyline 100 MG tablet  Commonly known as:  ELAVIL  Take 1  tablet (100 mg total) by mouth at bedtime.     amoxicillin-clavulanate 875-125 MG per tablet  Commonly known as:  AUGMENTIN  Take 1 tablet by mouth 2 (two) times daily. X 7 days     ibuprofen 800 MG tablet  Commonly known as:  ADVIL,MOTRIN  Take 800 mg by mouth every 8 (eight) hours as needed for mild pain.          Discharge Instructions:    May resume regular diet and activity.  Call the office if you develop shortness of breath, fever, chills or productive cough.   Follow Up:       Future Appointments Provider Department Dept Phone   09/03/2013 1:00 PM Loreli Slot, MD [facility] [phone]     Follow-up Information   Follow up with Loreli Slot, MD On 09/03/2013. (Appointment is at 1:00)    Specialty:  Cardiothoracic Surgery   Contact information:   [address] [place] [address] [phone]          Adella Hare 08/27/2013, 9:53 AM

## 2013-08-27 NOTE — Telephone Encounter (Signed)
Called spoke with patient who verified that she was scheduled for a left VATS yesterday on 4.6.15 This procedure was cancelled due to pt aspiration per pt and Dr Hendrickson's notes: Patient aspirated a large amount of gastric contents on induction. Bronchoscopy performed to clear airways but not advisable to proceed with lung resection under these circumstances  Pt stated that it was announced to "the lobby" that her procedure was cancelled d/t the above and when she mentioned this to Dr Roxan Hockey, he told her that it had already been done.  Pt is to be discharged this afternoon with instructions to follow up next week to reschedule her surgery.  Pt no longer wants Dr Roxan Hockey to perform this procedure and would like MR's recs on another surgeon.  Advised patient that MR is off all week but will return on 4.13.15.  Pt is asking if another provider could recommend someone in his place - advised pt that this is unlikely but will check as requested.  SN, as doc of the day do you have any recs for pt?  Thank you.

## 2013-08-27 NOTE — Op Note (Signed)
NAMEKASSI, ESTEVE             ACCOUNT NO.:  [id number]  MEDICAL RECORD NO.:  [id number]  LOCATION:  6N29C                        FACILITY:  [facility]  PHYSICIAN:  Revonda Standard. Roxan Hockey, M.D.DATE OF BIRTH:  [birth year]  DATE OF PROCEDURE:  08/26/2013 DATE OF DISCHARGE:                              OPERATIVE REPORT   PREOPERATIVE DIAGNOSIS:  Left upper lobe mass.  POSTOPERATIVE DIAGNOSIS:  Left upper lobe mass, aspiration on induction.  PROCEDURE:  Bronchoscopy and removal of foreign bodies.  SURGEON:  Revonda Standard. Roxan Hockey, M.D.  ANESTHESIA:  General.  FINDINGS:  On induction of general anesthesia, the patient vomited large amounts of gastric contents into the pharynx and the trachea.  Multiple foreign bodies extracted from the tracheobronchial tree.  CLINICAL NOTE:  Ms. Garraway is a 58 year old woman with a left upper lobe nodule that has increased in size in a 3 month interval since first being discovered. She was advised to undergo wedge resection for definitive diagnosis,followed by possible lobectomy if it turned out to be cancerous. The indications, risks, benefits, and alternatives were discussed in detail with the patient.  She understood and accepted the risks and agreed to proceed.  OPERATIVE NOTE:  Ms. Morning was brought to the preoperative holding area on August 26, 2013.  There, she had placement of a central line and arterial blood pressure monitoring line.  She was taken to the operating room and intravenous antibiotics were administered.  She was anesthetized and intubated.  Dr. Chriss Driver noted extensive gastric contents vomited and aspirated with induction.  Large amounts of gastrointestinal contents were suctioned from the double-lumen endotracheal tube.  The patient was reintubated with a single-lumen tube and flexible fiberoptic bronchoscopy was performed.  There was extensive thick mucous secretiona and particulate matter, this was cleared.  Each bronchial  orifice was inspected. There was a large piece of particulate matter in the left lower lobe bronchus which was removed with the biopsy forceps.  This was sent to pathology for gross examination only.  After removing the initial contaminant and foreign bodies, a thorough inspection was made of the tracheobronchial tree.  There were no endobronchial masses to the level of subsegmental bronchi.  Each of the lobar bronchi was flushed with saline and aspirated to remove as much of the gastric contents as possible.  After the airways had been cleared as much as possible, the bronchoscope was removed.  The patient was extubated in the operating room and taken to the postanesthetic care unit in good condition.     Revonda Standard Roxan Hockey, M.D.     SCH/MEDQ  D:  08/26/2013  T:  08/27/2013  Job:  [id number]

## 2013-08-27 NOTE — Telephone Encounter (Signed)
ATC pt again. Line rang several times. No vm and no answer wcb

## 2013-08-27 NOTE — Telephone Encounter (Signed)
Spoke to patient: she is in [phone]. She wants to switch to another surgen. She is upset that lap-band information was revealed to family. However, I mentioned to her that she took friend/family with her to translate medical information and receive medical information but she had not mentioned that lap band was confidential.   Spoke to Dr Roxan Hockey: ok for patient to switch to Dr Servando Snare. Dr hendrickson planning dc today and will arrange for fu with DR Servando Snare  Triage: just let patient know on [phone]  thoracic surgery services will on followup set up with with another surgeon in their practice   Dr. Brand Males, M.D., [facility]C.P Pulmonary and Critical Care Medicine Staff Physician [place] Pager: [phone], If no answer or between  15:00h - 7:00h: call [phone]  08/27/2013 10:50 AM

## 2013-08-27 NOTE — Telephone Encounter (Signed)
ATC pt Line busy wcb

## 2013-08-27 NOTE — Discharge Instructions (Signed)
May resume regular diet. May resume regular activity. Please call the office [phone]) if you develop fever >101, chills, productive cough, or shortness of breath.

## 2013-08-28 ENCOUNTER — Encounter (HOSPITAL_COMMUNITY): Payer: Self-pay | Admitting: Thoracic Surgery (Cardiothoracic Vascular Surgery)

## 2013-08-28 NOTE — Telephone Encounter (Signed)
Spoke with the pt and notified of recs per MR She verbalized understanding and denied any questions  I advised looks like they have already scheduled her to see Dr Servando Snare tomorrow at 11:00 am  I advised his office should be calling her to inform her of this and give any pre appt instructions  Nothing further needed

## 2013-09-02 ENCOUNTER — Encounter: Payer: Self-pay | Admitting: *Deleted

## 2013-09-02 NOTE — [facility] Oncology Navigator Note (Signed)
I called patient to check in and remind her of her upcoming appointment time and location.  Patient reports that she is doing well. She denied any questions or concerns at this time.  I gave her my contact information and encouraged her to call me for any needs.  Patient verbalized understanding.

## 2013-09-03 ENCOUNTER — Ambulatory Visit: Payer: Self-pay | Admitting: Thoracic Surgery (Cardiothoracic Vascular Surgery)

## 2013-09-05 ENCOUNTER — Ambulatory Visit (INDEPENDENT_AMBULATORY_CARE_PROVIDER_SITE_OTHER): Payer: BC Managed Care – PPO | Admitting: Physician Assistant

## 2013-09-05 ENCOUNTER — Encounter: Payer: Self-pay | Admitting: Cardiothoracic Surgery

## 2013-09-05 ENCOUNTER — Encounter: Payer: Self-pay | Admitting: Internal Medicine

## 2013-09-05 ENCOUNTER — Ambulatory Visit (INDEPENDENT_AMBULATORY_CARE_PROVIDER_SITE_OTHER): Payer: BC Managed Care – PPO | Admitting: Cardiothoracic Surgery

## 2013-09-05 ENCOUNTER — Encounter (INDEPENDENT_AMBULATORY_CARE_PROVIDER_SITE_OTHER): Payer: Self-pay

## 2013-09-05 VITALS — BP 124/78 | HR 80 | Temp 97.0°F | Resp 14 | Ht 60.0 in | Wt 210.4 lb

## 2013-09-05 VITALS — BP 150/86 | HR 78 | Resp 16 | Ht 61.0 in | Wt 215.0 lb

## 2013-09-05 DIAGNOSIS — Z4651 Encounter for fitting and adjustment of gastric lap band: Secondary | ICD-10-CM

## 2013-09-05 DIAGNOSIS — D381 Neoplasm of uncertain behavior of trachea, bronchus and lung: Secondary | ICD-10-CM

## 2013-09-05 NOTE — Progress Notes (Signed)
[address]       [facility],[place] [address]             [id number]                    Lauren Campos [facility] Medical Record #[id number] Date of Birth: Jun 01, 1955  Referring: Brand Males, MD Primary Care: Nyoka Cowden, MD  Chief Complaint:    Chief Complaint  Patient presents with  . Lung Lesion    LULobe .Marland KitchenMarland KitchenCT CHEST .Marland KitchenMarland KitchenPET    History of Present Illness:    Lauren Campos 58 y.o. female is seen in the office  today for as a second opinion for resection of left upper lobe lung nodule. The patient noted neck swelling in February of 2014. A CT scan of the neck at that time revealed a 5.9 mm left upper lobe lung lesion(06/26/2012). A repeat scan showed enlargement of the left upper lobe lung nodule 8.5 mm on scan noted 04/04/2013. Repeat super D. CT scan of the chest March 2015 revealed the lesion had enlarged to 11 mm. The patient was to undergo surgical resection of the left upper lobe lung lesion for treatment and for diagnosis 10 days ago, with induction of anesthesia she had significant amount of vomiting with aspiration, bronchoscopy only was performed and the patient recovered from the event. She has a lap band in place and recently had increased fluid placed in the band. CT scan showed dilated esophagus with air-fluid levels.    Current Activity/ Functional Status:  Patient is independent with mobility/ambulation, transfers, ADL's, IADL's.   Zubrod Score: At the time of surgery this patient's most appropriate activity status/level should be described as: []     0    Normal activity, no symptoms [x]     1    Restricted in physical strenuous activity but ambulatory, able to do out light work []     2    Ambulatory and capable of self care, unable to do work activities, up and about               >50 % of waking hours                              []     3    Only limited self care, in bed greater than 50% of waking hours []     4     Completely disabled, no self care, confined to bed or chair []     5    Moribund   Past Medical History  Diagnosis Date  .   07/20/2006  . BACK PAIN, LOW 07/20/2006  . BREAST LUMP 09/05/2007    "benign"  . CONSTIPATION, CHRONIC 08/10/2009  . DIVERTICULOSIS, COLON 08/10/2009  . Hemoptysis 08/27/2008  . HYPERLIPIDEMIA 05/08/2007  . HYPERLIPIDEMIA     "not anymore" (08/26/2013)  . INSOMNIA NOS 07/20/2006  . 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 OBESITY, NOS 07/20/2006  . Pulmonary nodule   . Heart murmur   . GERD (gastroesophageal reflux disease)   . Pneumonia 2014    "once" (08/26/2013)  . MIGRAINE, COMMON 05/08/2007    "haven't had one in ~ 6 months" (08/26/2013)  . Lung cancer     Past Surgical History  Procedure Laterality Date  . Laparoscopic gastric banding  ~ 2006  . Colonoscopy    . Bronchoscopy  08/26/2013  . Abdominal hysterectomy  1990's  .  Tubal ligation  1980's  . Shoulder arthroscopy w/ rotator cuff repair Left 2014  . Wrist fracture surgery Left ~ 2010  . Video bronchoscopy N/A 08/26/2013    Procedure: VIDEO BRONCHOSCOPY;  Surgeon: Melrose Nakayama, MD;  Location: [facility] OR;  Service: Thoracic;  Laterality: N/A;    Family History  Problem Relation Age of Onset  . Cancer Mother   . Diabetes Mother   . Heart disease Mother   . Cancer Father   . Diabetes Sister     History   Social History  . Marital Status: Divorced    Spouse Name: N/A    Number of Children: N/A  . Years of Education: N/A   Occupational History  . Not on file.   Social History Main Topics  . Smoking status: Current Every Day Smoker -- 1.00 packs/day for 44 years    Types: Cigarettes  . Smokeless tobacco: Never Used  . Alcohol Use: No  . Drug Use: No  . Sexual Activity: Not Currently   Other Topics Concern  . Not on file   Social History Narrative  . No narrative on file    History  Smoking status  . Current Every Day Smoker -- 1.00 packs/day for 44 years  . Types: Cigarettes  Smokeless tobacco    . Never Used    History  Alcohol Use No     No Known Allergies  Current Outpatient Prescriptions  Medication Sig Dispense Refill  . amitriptyline (ELAVIL) 100 MG tablet Take 1 tablet (100 mg total) by mouth at bedtime.  90 tablet  1  . ibuprofen (ADVIL,MOTRIN) 800 MG tablet Take 800 mg by mouth every 8 (eight) hours as needed for mild pain.        No current facility-administered medications for this visit.     Review of Systems:     Cardiac Review of Systems: Y or N  Chest Pain [  n  ]  Resting SOB [n   ] Exertional SOB  [  y]  Orthopnea Florencio.Farrier  ]   Pedal Edema [ y  ]    Palpitations [ n ] Syncope  [n]   Presyncope [ n  ]  General Review of Systems: [Y] = yes [  ]=no Constitional: recent weight change [n  ];  Wt loss over the last 3 months [   ] anorexia [  ]; fatigue [ y ]; nausea [  ]; night sweats [  ]; fever [  ]; or chills [  ];          Dental: poor dentition[ n ]; Last Dentist visit:   Eye : blurred vision [  ]; diplopia [   ]; vision changes [  ];  Amaurosis fugax[  ]; Resp: cough [ y ];  wheezing[ y ];  hemoptysis[ years ago ]; shortness of breath[  y]; paroxysmal nocturnal dyspnea[ y ]; dyspnea on exertion[  ]; or orthopnea[  ];  GI:  gallstones[  ], vomiting[  ];  dysphagia[  ]; melena[  ];  hematochezia [  ]; heartburn[  ];   Hx of  Colonoscopy[  ]; GU: kidney stones [  ]; hematuria[  ];   dysuria [  ];  nocturia[  ];  history of     obstruction [  ]; urinary frequency [  ]             Skin: rash, swelling[  ];, hair loss[  ];  peripheral edema[  ];  or itching[  ]; Musculosketetal: myalgias[  ];  joint swelling[y  ];  joint erythema[  ];  joint pain[  ];  back pain[y  ];  Heme/Lymph: bruising[  ];  bleeding[  ];  anemia[  ];  Neuro: TIA[n  ];  headaches[ n ];  stroke[  ];  vertigo[  ];  seizures[  ];   paresthesias[  ];  difficulty walking[ n ];  Psych:depression[  ]; anxiety[  ];  Endocrine: diabetes[  ];  thyroid dysfunction[  ];  Immunizations: Flu up to date [ y ];  Pneumococcal up to date Blue.Reese  ];  Other:  Physical Exam: BP 150/86  Pulse 78  Resp 16  Ht 5\' 1"  (1.549 m)  Wt 215 lb (97.523 kg)  BMI 40.64 kg/m2  SpO2 97%  PHYSICAL EXAMINATION:  General appearance: alert, cooperative, appears older than stated age and moderately obese Neurologic: intact Heart: 2/6 early systolic murmur Lungs: Patient has distant breath sounds bilaterally, she's not actively wheezing  Abdomen: Moderately obese abdomen without masses Extremities: She has no evidence of DVT or pedal edema Do not appreciate any cervical or supraclavicular adenopathy    Diagnostic Studies & Laboratory data:     Recent Radiology Findings:   Dg Chest 2 View  08/27/2013   CLINICAL DATA:  Cough, possible aspiration  EXAM: CHEST  2 VIEW  COMPARISON:  08/26/2013  FINDINGS: Right subclavian line tip projecting over SVC confluence.  Enlargement of cardiac silhouette with pulmonary vascular congestion.  Tortuous aorta.  Improved perihilar edema.  No gross pleural effusion or pneumothorax.  IMPRESSION: Improved pulmonary edema.   Electronically Signed   By: Lavonia Dana M.D.   On: 08/27/2013 08:08     RADIOLOGY REPORT*  Clinical Data: Improvement in neck pain. Post flexible laryngoscopy  without abnormality noted. Reevaluate possible piriform sinus  mass.  CT NECK WITH CONTRAST  Technique: Multidetector CT imaging of the neck was performed with  intravenous contrast.  Contrast: 36mL OMNIPAQUE IOHEXOL 300 MG/ML SOLN  Comparison: 06/25/2012.  Findings: Left piriform sinus is now aerated and relatively  symmetric when compared to the right without mass identified.  Within the visualized upper lung zones, emphysematous changes are  noted with small pleural effusions greater on the left.  Additionally, scattered nodules are noted measuring 5 mm right  upper lobe, 6 mm left upper lobe and 4 mm superior segment left  lower lobe. These are not completely assessed on present  examination.  If  the patient is at high risk for bronchogenic carcinoma, follow-  up chest CT at 6-12 months is recommended. If the patient is at  low risk for bronchogenic carcinoma, follow-up chest CT at 12  months is recommended. This recommendation follows the consensus  statement: Guidelines for Management of Small Pulmonary Nodules  Detected on CT Scans: A Statement from the [place] as  published in Radiology 2005; 237:395-400.  Retropharyngeal fluid collection remains with maximal thickness at  the C3 level with AP dimension of 3.8 mm.  Haziness of fat planes along the left supraclavicular region and  left chest wall with left supraclavicular adenopathy. Scattered  lymph nodes throughout the neck largest in the left level II region  measuring 1.1 x 1.5 x 1.7 cm.  Etiology of these findings is indeterminate. Considerations include  infectious process, inflammatory process, angioedema or result of  vascular engorgement.  Visualized intracranial structures and orbital structures  unremarkable.  No primary parotid, submandibular, posterior-superior  nasopharyngeal, palatine tonsil, tongue base,  glottic or thyroid  lesion noted.  Atherosclerotic type changes with calcification aortic arch and  carotid bifurcation without hemodynamically significant stenosis.  No calcification of the longus colonic. Scattered cervical  spondylotic changes without bony destructive lesion.  IMPRESSION:  Left piriform sinus is now aerated and relatively symmetric when  compared to the right without mass identified.  Retropharyngeal fluid collection remains with maximal thickness at  the C3 level with AP dimension of 3.8 mm.  Haziness of fat planes along the left supraclavicular region and  left chest wall with left supraclavicular adenopathy. Scattered  lymph nodes throughout the neck largest in the left level II region  measuring 1.1 x 1.5 x 1.7 cm.  Etiology of these findings is indeterminate.  Considerations include  infectious process, inflammatory process, angioedema or result of  vascular engorgement. Result of neoplasm felt to be a less likely  consideration.  Within the visualized upper lung zones, emphysematous changes are  noted with small pleural effusions greater on the left.  Additionally, scattered nodules are noted measuring 5 mm right  upper lobe, 6 mm left upper lobe and 4 mm superior segment left  lower lobe. These are not completely assessed on present  examination.  If the patient is at high risk for bronchogenic carcinoma, follow-  up chest CT at 6-12 months is recommended. If the patient is at  low risk for bronchogenic carcinoma, follow-up chest CT at 12  months is recommended. This recommendation follows the consensus  statement: Guidelines for Management of Small Pulmonary Nodules  Detected on CT Scans: A Statement from the [place] as  published in Radiology 2005; 237:395-400.  Original Report Authenticated By: Genia Del, M.D.  JT:[id number] DATA: Nonproductive cough. Night sweats. Recent treatment  for pneumonia.  EXAM:  CT CHEST WITHOUT CONTRAST  TECHNIQUE:  Multidetector CT imaging of the chest was performed following the  standard protocol without IV contrast.  COMPARISON: PA and lateral chest earlier this same day, 02/27/2013  hand 05/12/2009. CT chest 12/24/2003.  FINDINGS:  There is no axillary, hilar or mediastinal lymphadenopathy. Pleural  or pericardial effusion. Heart size is normal. The esophagus is  mildly dilated with an air-fluid level. Lap band is noted. Lungs  demonstrate fairly extensive centrilobular emphysematous disease.  Small focus of micro nodularity is identified in the right lung apex  most consistent with prior inflammatory change. No consolidative  process is present. There is a left upper lobe nodule measuring 0.9  cm in diameter on image 14. A 0.4 cm left upper lobe nodule is seen  image 19. The lungs are  otherwise unremarkable. Incidentally imaged  upper abdomen demonstrates no focal abnormality. No focal bony  abnormality is identified.  IMPRESSION:  Negative for pneumonia.  0.9 cm left upper lobe nodule could be secondary to bronchogenic  carcinoma. PET CT scan is recommended for further evaluation.  Mildly dilated esophagus with an air-fluid level may be secondary to  the patient's lap band.   CLINICAL DATA: Followup of pulmonary nodule. History of hemoptysis.  Gastric banding. Obesity.  EXAM:  CT CHEST WITHOUT CONTRAST  TECHNIQUE:  Multidetector CT imaging of the chest was performed using thin slice  collimation for electromagnetic bronchoscopy planning purposes,  without intravenous contrast.  COMPARISON: DG RIBS UNILATERAL W/CHEST*R* dated 06/01/2013; NM PET  IMAGE INITIAL (PI) SKULL BASE TO THIGH dated 04/30/2013; CT CHEST W/O  CM dated 04/04/2013  FINDINGS:  Lungs/Pleura: Moderate centrilobular emphysema. Similar subpleural 3  mm right apical lung nodule on image 7.  Posterior left upper lobe pulmonary nodule measures 11 x 11 mm on  image 13 and has enlarged from the prior exam, where it measured 9 x  9 mm (when remeasured).  3 mm subpleural nodule in the left upper lobe on image 18 is less  conspicuous in on the prior exam. There is an adjacent more cephalad  3 mm nodule which is likely new but also favored to represent a  subpleural lymph node.  3 mm left lower lobe nodule on image 29 demonstrates central  cavitation and is not readily apparent on the prior.  Subpleural 3 mm superior segment left lower lobe nodule on image 13  is likely new. No pleural fluid.  Heart/Mediastinum: Normal heart size, without pericardial effusion.  No mediastinal or definite hilar adenopathy, given limitations of  unenhanced CT. Small hiatal hernia. Dilated esophagus with fluid  level within.  Upper Abdomen: Suspicion of a hypo attenuating left liver lobe  lesion at 1.6 cm on image  50/series 2. Likely similar on image 115  of the prior PET. Indeterminate. Lap band. Normal imaged adrenal  glands. A right-sided fat containing Bochdalek's hernia.  Bones/Musculoskeletal: Multiple healing anterior right rib  fractures, new since 04/04/2013.  IMPRESSION:  1. Mild but definite enlargement of a left upper lobe pulmonary  nodule since 04/04/2013. This is highly suspicious for primary  bronchogenic carcinoma.  2. Centrilobular emphysema. Scattered smaller nodules. Some are  larger and some are smaller. Primarily felt to represent benign  entities such as subpleural lymph nodes. A tiny but cavitary left  lower lobe nodule warrant special followup attention.  3. Suspicion of a low-density left hepatic lobe lesion. This was  present on the 04/30/2013 PET and is not definitely hypermetabolic.  Recommend either attention on followup or definitive  characterization with dedicated abdominal MRI.  4. Lap band in place. Esophageal dilatation and fluid level could be  secondary or represent dysmotility.  5. Interval nonacute anterior right rib fractures.  Electronically Signed  By: Abigail Miyamoto M.D.  On: 07/29/2013 15:18  PET: CLINICAL DATA: The initial treatment strategy for pulmonary nodule  on chest CT. Nonproductive cough with night sweats. No given history  of malignancy.  EXAM:  NUCLEAR MEDICINE PET SKULL BASE TO THIGH  FASTING BLOOD GLUCOSE: Value: 82mg /dl  TECHNIQUE:  18.5 mCi F-18 FDG was injected intravenously. CT data was obtained  and used for attenuation correction and anatomic localization only.  (This was not acquired as a diagnostic CT examination.) Additional  exam technical data entered on technologist worksheet.  COMPARISON: Chest CT 04/04/2013.  FINDINGS:  NECK  No hypermetabolic lymph nodes in the neck.  CHEST  There are no hypermetabolic mediastinal, hilar or axillary lymph  nodes. The left upper lobe 9 mm pulmonary nodule does not show    significantly increased metabolic activity. This is seen on image 63  and has an SUV max of 1.8, similar to background soft tissue  activity. No enlarging pulmonary nodules are identified. Scattered  smaller nodules are present bilaterally. In addition moderate  emphysematous changes are present throughout both lungs.  ABDOMEN/PELVIS  There is no abnormal metabolic activity within the liver, spleen,  adrenal glands or pancreas. There are no hypermetabolic abdominal  pelvic lymph nodes. A hiatal hernia and gastric laparoscopic band  are noted.  SKELETON  No focal hypermetabolic activity to suggest skeletal metastasis.  IMPRESSION:  1. The left upper lobe pulmonary nodule and additional smaller  pulmonary nodules do not show significantly increased metabolic  activity. This is a reassuring finding, although does not completely  exclude low grade neoplasm. Chest CT follow-up in 6 months  recommended.  2. No hypermetabolic nodal activity within the neck, chest, abdomen  or pelvis.  Electronically Signed  By: Camie Patience M.D.  On: 04/30/2013 13:24  Recent Lab Findings: Lab Results  Component Value Date   WBC 16.3* 08/27/2013   HGB 11.2* 08/27/2013   HCT 34.4* 08/27/2013   PLT 275 08/27/2013   GLUCOSE 129* 08/27/2013   CHOL 214* 06/07/2011   TRIG 63.0 06/07/2011   HDL 63.70 06/07/2011   LDLDIRECT 130.4 06/07/2011   LDLCALC  Value: 102        Total Cholesterol/HDL:CHD Risk Coronary Heart Disease Risk Table                     Men   Women  1/2 Average Risk   3.4   3.3  Average Risk       5.0   4.4  2 X Average Risk   9.6   7.1  3 X Average Risk  23.4   11.0        Use the calculated Patient Ratio above and the CHD Risk Table to determine the patient's CHD Risk.        ATP III CLASSIFICATION (LDL):  <100     mg/dL   Optimal  100-129  mg/dL   Near or Above                    Optimal  130-159  mg/dL   Borderline  160-189  mg/dL   High  >190     mg/dL   Very High* 05/13/2009   ALT 18 08/22/2013   AST 23  08/22/2013   NA 141 08/27/2013   K 3.1* 08/27/2013   CL 101 08/27/2013   CREATININE 0.61 08/27/2013   BUN 7 08/27/2013   CO2 28 08/27/2013   TSH 1.18 06/07/2011   INR 0.98 08/22/2013   HGBA1C  Value: 5.8 (NOTE) The ADA recommends the following therapeutic goal for glycemic control related to Hgb A1c measurement: Goal of therapy: <6.5 Hgb A1c  Reference: American Diabetes Association: Clinical Practice Recommendations 2010, Diabetes Care, 2010, 33: (Suppl  1). 05/12/2009    PFT's  08/06/2013 FEV1 2.11 112%  DLCO 16   79%   Assessment / Plan:   Patient with moderate emphysema an enlarging left upper lobe lung nodule though not hypermetabolic. The patient was taken to the operating room and aspirated at the time of general anesthetic probably related to her esophageal motility and partial obstruction with chronic air fluid levels in the esophagus. I've asked her turned to see Dr. Hassell Done about further evaluation of her esophagus and relaxation on her lap band at least around the time of repeat general anesthesia and postoperative care after potential lobectomy.  In this setting both Intra-Op and the postop period further aspiration would result in catastrophic results.  I reviewed the CT scan with the patient that shows a slowly enlarging left upper lobe lung nodule that in her long smoking history is suspicious for a slow-growing malignancy though not confirmatory. I agree with her previous plan to proceed with left VATS and resection, possible lobectomy. Before this takes place I told her she will need to completely stop smoking. With her history of cardiac murmur long-term smoking I will have cardiology see her for preoperative clearance.   I plan to see her back in one  week after she is seen by general surgery and cardiology.          I spent 40 minutes counseling the patient face to face. The total time spent in the appointment was 60 minutes.  [name] MD      [address] [place] [address] Office [phone]   Beeper [phone]  09/05/2013 11:42 AM

## 2013-09-05 NOTE — Progress Notes (Signed)
  HISTORY: Lauren Campos is a 58 y.o.female who received an 10cm lap-band in July 2005 by Dr. Hassell Done. She comes in with 12 lbs weight loss since her last visit one month ago when Dr. Hassell Done did a 0.25 mL fill. She has lost 33 lbs since surgery. She was to undergo resection of a left upper lobe lung lesion by Dr. Servando Snare earlier this month but she had an aspiration event during induction and the procedure was aborted. Imaging revealed fluid levels in the esophagus suggestive of issues with the band. She reports doing better since then. She also reports having 2-3 episodes a week of nocturnal cough with some regurgitation since having the last fill.  VITAL SIGNS: Filed Vitals:   09/05/13 1446  BP: 124/78  Pulse: 80  Temp: 97 F (36.1 C)  Resp: 14    PHYSICAL EXAM: Physical exam reveals a very well-appearing 58 y.o.female in no apparent distress Neurologic: Awake, alert, oriented Psych: Bright affect, conversant Respiratory: Breathing even and unlabored. No stridor or wheezing Abdomen: Soft, nontender, nondistended to palpation. Incisions well-healed. No incisional hernias. Port easily palpated. Extremities: Atraumatic, good range of motion.  ASSESMENT: 58 y.o.  female  s/p 10cm lap-band.   PLAN: We received and reviewed Dr. Marilynne Drivers last clinic note. Our plan is to remove all fluid from the band today to minimize its effect on anesthesia. The patient's port was accessed with a 20G Huber needle without difficulty. Clear fluid was aspirated and 2.2 mL saline was removed from the port to give a total predicted volume of 0 mL. The patient was advised to concentrate on healthy food choices and to avoid slider foods high in fats and carbohydrates. She is to follow-up with cardiology next then to see Dr. Servando Snare to reschedule her procedure. I asked her to return after she recovers from her lung operation so we can get back on track with weight loss. She voiced understanding and agreement.

## 2013-09-05 NOTE — Patient Instructions (Addendum)
Return after your operation. Focus on good food choices as well as physical activity. Return sooner for difficulty swallowing, night cough, reflux.

## 2013-09-23 ENCOUNTER — Encounter: Payer: Self-pay | Admitting: *Deleted

## 2013-09-23 NOTE — [facility] Oncology Navigator Note (Signed)
I called patient to check in and to review her upcoming appointment schedule.  Patient reports that she is doing well.  She has had fluid removed from her lap band and is scheduled to see cardiology per Dr. Everrett Coombe instructions in preparation for surgery.  Patient denies any questions at this time.  I encouraged her to call me for any needs.

## 2013-10-02 ENCOUNTER — Ambulatory Visit (INDEPENDENT_AMBULATORY_CARE_PROVIDER_SITE_OTHER): Payer: BC Managed Care – PPO | Admitting: Cardiovascular Disease

## 2013-10-02 ENCOUNTER — Encounter: Payer: Self-pay | Admitting: Cardiovascular Disease

## 2013-10-02 VITALS — BP 130/74 | HR 84 | Ht 61.0 in | Wt 220.3 lb

## 2013-10-02 DIAGNOSIS — R0609 Other forms of dyspnea: Secondary | ICD-10-CM

## 2013-10-02 DIAGNOSIS — R079 Chest pain, unspecified: Secondary | ICD-10-CM | POA: Insufficient documentation

## 2013-10-02 DIAGNOSIS — R0989 Other specified symptoms and signs involving the circulatory and respiratory systems: Secondary | ICD-10-CM

## 2013-10-02 DIAGNOSIS — Z01818 Encounter for other preprocedural examination: Secondary | ICD-10-CM

## 2013-10-02 NOTE — Patient Instructions (Signed)
  We will see you back in follow up only as needed.   Dr Gwenlyn Found has ordered ; 1.  Echocardiogram. Echocardiography is a painless test that uses sound waves to create images of your heart. It provides your doctor with information about the size and shape of your heart and how well your heart's chambers and valves are working. This procedure takes approximately one hour. There are no restrictions for this procedure.   2. Lexiscan Myoview- this is a test that looks at the blood flow to your heart muscle.  It takes approximately 2 1/2 hours. Please follow instruction sheet, as given.

## 2013-10-02 NOTE — Progress Notes (Signed)
10/02/2013 Lauren Campos   [birth year]  [id number]  Primary Physician Lauren Cowden, MD Primary Cardiologist: Lauren Harp MD Lauren Campos   HPI:  Lauren Campos is a 58 year old moderately overweight divorced Afro-American female mother of 2 children, grandmother up for graduate and works as a Lauren Campos. Her primary care physician is Lauren Campos. She was referred by Lauren Campos for cardiovascular clearance prior to any VATS procedure scheduled to be performed in the upcoming future because of a lung nodule. Her cardiovascular psychopathology markable for ongoing tobacco abuse one half pack per day for the last 30 years as well as family history of heart disease with a brother who died of a microinfarction at age 71. She does get occasional substernal chest pain radiating to her shoulders and back as well as dyspnea on exertion   Current Outpatient Prescriptions  Medication Sig Dispense Refill  . amitriptyline (ELAVIL) 100 MG tablet Take 1 tablet (100 mg total) by mouth at bedtime.  90 tablet  1  . ibuprofen (ADVIL,MOTRIN) 800 MG tablet Take 800 mg by mouth every 8 (eight) hours as needed for mild pain.        No current facility-administered medications for this visit.    No Known Allergies  History   Social History  . Marital Status: Divorced    Spouse Name: N/A    Number of Children: N/A  . Years of Education: N/A   Occupational History  . Not on file.   Social History Main Topics  . Smoking status: Current Every Day Smoker -- 1.00 packs/day for 44 years    Types: Cigarettes  . Smokeless tobacco: Never Used  . Alcohol Use: No  . Drug Use: No  . Sexual Activity: Not Currently   Other Topics Concern  . Not on file   Social History Narrative  . No narrative on file     Review of Systems: General: negative for chills, fever, night sweats or weight changes.  Cardiovascular: negative for chest pain, dyspnea on exertion, edema, orthopnea,  palpitations, paroxysmal nocturnal dyspnea or shortness of breath Dermatological: negative for rash Respiratory: negative for cough or wheezing Urologic: negative for hematuria Abdominal: negative for nausea, vomiting, diarrhea, bright red blood per rectum, melena, or hematemesis Neurologic: negative for visual changes, syncope, or dizziness All other systems reviewed and are otherwise negative except as noted above.    Blood pressure 130/74, pulse 84, height 5\' 1"  (1.549 m), weight 220 lb 4.8 oz (99.927 kg).  General appearance: alert and no distress Neck: no adenopathy, no carotid bruit, no JVD, supple, symmetrical, trachea midline and thyroid not enlarged, symmetric, no tenderness/mass/nodules Lungs: clear to auscultation bilaterally Heart: regular rate and rhythm, S1, S2 normal, no murmur, click, rub or gallop Extremities: extremities normal, atraumatic, no cyanosis or edema  EKG normal sinus rhythm at 84 without ST or T wave changes  ASSESSMENT AND PLAN:   Chest pain The patient was referred by Lauren Campos for cardiovascular clearance prior to a back procedure to remove a pulmonary nodule. Factors include 15-pack-year history of tobacco abuse currently one half pack per day as well as a strong family history of heart disease with a brother who died of a myocardial infarction at age 49. She's never had a heart pressure. She does complain of chest pain that occurs several times a week with radiation to her upper extremities and back. She also has dyspnea on exertion. I'm going to get a pharmacologic Myoview stress test and 2-D  echo stratify her for general anesthesia prior to her upcoming thoracic procedure.      Lauren Harp MD FACP,FACC,FAHA, [facility] 10/02/2013 12:08 PM

## 2013-10-02 NOTE — Assessment & Plan Note (Signed)
The patient was referred by Dr. Servando Snare for cardiovascular clearance prior to a back procedure to remove a pulmonary nodule. Factors include 15-pack-year history of tobacco abuse currently one half pack per day as well as a strong family history of heart disease with a brother who died of a myocardial infarction at age 58. She's never had a heart pressure. She does complain of chest pain that occurs several times a week with radiation to her upper extremities and back. She also has dyspnea on exertion. I'm going to get a pharmacologic Myoview stress test and 2-D echo stratify her for general anesthesia prior to her upcoming thoracic procedure.

## 2013-10-10 ENCOUNTER — Telehealth (HOSPITAL_COMMUNITY): Payer: Self-pay

## 2013-10-10 ENCOUNTER — Ambulatory Visit: Payer: BC Managed Care – PPO | Admitting: Cardiothoracic Surgery

## 2013-10-15 ENCOUNTER — Ambulatory Visit (HOSPITAL_COMMUNITY)
Admission: RE | Admit: 2013-10-15 | Discharge: 2013-10-15 | Disposition: A | Discharge status: Home / Self Care | Payer: BC Managed Care – PPO | Source: Ambulatory Visit | Attending: Cardiovascular Disease | Admitting: Cardiovascular Disease

## 2013-10-15 ENCOUNTER — Ambulatory Visit (HOSPITAL_BASED_OUTPATIENT_CLINIC_OR_DEPARTMENT_OTHER)
Admission: RE | Admit: 2013-10-15 | Discharge: 2013-10-15 | Disposition: A | Discharge status: Home / Self Care | Payer: BC Managed Care – PPO | Source: Ambulatory Visit | Attending: Cardiovascular Disease | Admitting: Cardiovascular Disease

## 2013-10-15 DIAGNOSIS — R0989 Other specified symptoms and signs involving the circulatory and respiratory systems: Secondary | ICD-10-CM | POA: Insufficient documentation

## 2013-10-15 DIAGNOSIS — R0609 Other forms of dyspnea: Secondary | ICD-10-CM

## 2013-10-15 DIAGNOSIS — Z01818 Encounter for other preprocedural examination: Secondary | ICD-10-CM

## 2013-10-15 DIAGNOSIS — R079 Chest pain, unspecified: Secondary | ICD-10-CM | POA: Insufficient documentation

## 2013-10-15 DIAGNOSIS — I359 Nonrheumatic aortic valve disorder, unspecified: Secondary | ICD-10-CM

## 2013-10-15 MED ORDER — REGADENOSON 0.4 MG/5ML IV SOLN
0.4000 mg | Freq: Once | INTRAVENOUS | Status: AC
  Administered 2013-10-15: 0.4 mg via INTRAVENOUS

## 2013-10-15 MED ORDER — TECHNETIUM TC 99M SESTAMIBI GENERIC - CARDIOLITE
10.5000 | Freq: Once | INTRAVENOUS | Status: AC | PRN
  Administered 2013-10-15: 11 via INTRAVENOUS

## 2013-10-15 MED ORDER — TECHNETIUM TC 99M SESTAMIBI GENERIC - CARDIOLITE
30.9000 | Freq: Once | INTRAVENOUS | Status: AC | PRN
  Administered 2013-10-15: 30.9 via INTRAVENOUS

## 2013-10-15 NOTE — Procedures (Addendum)
[address] [place] [address] [phone]  Cardiology Nuclear Med Study  Lauren Campos is a 58 y.o. female     MRN : [id number]     DOB: 09/15/1955  Procedure Date: 10/15/2013  Nuclear Med Background Indication for Stress Test:  Surgical Clearance History:  COPD and No prior cardiac history reported. No prior NUC MPI for comparison. Cardiac Risk Factors: Family History - CAD, Obesity and Smoker  Symptoms:  Chest Pain and DOE   Nuclear Pre-Procedure Caffeine/Decaff Intake:  7:00pm NPO After: 5:00am   IV Site: R Hand  IV 0.9% NS with Angio Cath:  22g  Chest Size (in):  n/a IV Started by: Rolene Course, RN  Height: 5\' 1"  (1.549 m)  Cup Size: D  BMI:  Body mass index is 41.59 kg/(m^2). Weight:  220 lb (99.791 kg)   Tech Comments:  n/a    Nuclear Med Study 1 or 2 day study: 1 day  Stress Test Type:  [place] Provider:  Quay Burow, MD   Resting Radionuclide: Technetium 25m Sestamibi  Resting Radionuclide Dose: 10.5 mCi   Stress Radionuclide:  Technetium 58m Sestamibi  Stress Radionuclide Dose: 30.9 mCi           Stress Protocol Rest HR: 61 Stress HR: 74  Rest BP: 117/79 Stress BP: 125/55  Exercise Time (min): n/a METS: n/a   Predicted Max HR: 163 bpm % Max HR: 48.47 bpm Rate Pressure Product: 10428  Dose of Adenosine (mg):  n/a Dose of Lexiscan: 0.4 mg  Dose of Atropine (mg): n/a Dose of Dobutamine: n/a mcg/kg/min (at max HR)  Stress Test Technologist: Leane Para, CCT Nuclear Technologist: Imagene Riches, CNMT   Rest Procedure:  Myocardial perfusion imaging was performed at rest 45 minutes following the intravenous administration of Technetium 13m Sestamibi. Stress Procedure:  The patient received IV Lexiscan 0.4 mg over 15-seconds.  Technetium 30m Sestamibi injected IV at 30-seconds.  There were no significant changes with Lexiscan.  Quantitative spect images were  obtained after a 45 minute delay.  Transient Ischemic Dilatation (Normal <1.22):  1.08 Lung/Heart Ratio (Normal <0.45):  0.22 QGS EDV:  125 ml QGS ESV:  51 ml LV Ejection Fraction: 59%       Rest ECG: NSR - Normal EKG  Stress ECG: No significant change from baseline ECG  QPS Raw Data Images:  Normal; no motion artifact; normal heart/lung ratio. Stress Images:  Normal homogeneous uptake in all areas of the myocardium. Rest Images:  Normal homogeneous uptake in all areas of the myocardium. Subtraction (SDS):  No evidence of ischemia.  Impression Exercise Capacity:  Lexiscan with no exercise. BP Response:  Normal blood pressure response. Clinical Symptoms:  No significant symptoms noted. ECG Impression:  No significant ST segment change suggestive of ischemia. Comparison with Prior Nuclear Study: No images to compare  Overall Impression:  Normal stress nuclear study.  LV Wall Motion:  NL LV Function; NL Wall Motion   Lorretta Harp, MD  10/15/2013 5:13 PM

## 2013-10-15 NOTE — Progress Notes (Signed)
2D Echocardiogram Complete.  10/15/2013   Zaylah Blecha, RDCS

## 2013-10-17 ENCOUNTER — Encounter: Payer: Self-pay | Admitting: Cardiothoracic Surgery

## 2013-10-17 ENCOUNTER — Other Ambulatory Visit: Payer: Self-pay

## 2013-10-17 ENCOUNTER — Ambulatory Visit (INDEPENDENT_AMBULATORY_CARE_PROVIDER_SITE_OTHER): Payer: BC Managed Care – PPO | Admitting: Cardiothoracic Surgery

## 2013-10-17 VITALS — BP 123/82 | HR 81 | Resp 16 | Ht 61.0 in | Wt 220.0 lb

## 2013-10-17 DIAGNOSIS — R911 Solitary pulmonary nodule: Secondary | ICD-10-CM

## 2013-10-17 DIAGNOSIS — D381 Neoplasm of uncertain behavior of trachea, bronchus and lung: Secondary | ICD-10-CM

## 2013-10-17 NOTE — Progress Notes (Signed)
[address] Campos [address]       [place] [address]             [phone]                      Lauren Campos Medical Record #[id number] Date of Birth: 17-Jun-1955  Referring: Brand Males, MD Primary Care: Nyoka Cowden, MD  Chief Complaint:    Chief Complaint  Patient presents with  . Lung Lesion    f/u after cardiac clearance by Dr. Gwenlyn Found.  She saw Dr. Earlie Server P.A. and the fluid was removed from her lap-band port until after her surgery.    History of Present Illness:    Lauren Campos 58 y.o. female is seen in the office  today for as a second opinion for resection of left upper lobe lung nodule after a cardiac clearence. The patient noted neck swelling in February of 2014. A CT scan of the neck at that time revealed a 5.9 mm left upper lobe lung lesion(06/26/2012). A repeat scan showed enlargement of the left upper lobe lung nodule 8.5 mm on scan noted 04/04/2013. Repeat super D. CT scan of the chest March 2015 revealed the lesion had enlarged to 11 mm. The patient was to undergo surgical resection of the left upper lobe lung lesion for treatment and for diagnosis 6 weeks  ago, with induction of anesthesia she had significant amount of vomiting with aspiration, bronchoscopy only was performed and the patient recovered from the event. She has a lap band in place and since last seen has had the fluid removed from band. CT scan showed dilated esophagus with air-fluid levels.    Current Activity/ Functional Status:  Patient is independent with mobility/ambulation, transfers, ADL's, IADL's.   Zubrod Score: At the time of surgery this patient's most appropriate activity status/level should be described as: []     0    Normal activity, no symptoms [x]     1    Restricted in physical strenuous activity but ambulatory, able to do out light work []     2    Ambulatory and capable of self care, unable to do work activities, up and about                >50 % of waking hours                              []     3    Only limited self care, in bed greater than 50% of waking hours []     4    Completely disabled, no self care, confined to bed or chair []     5    Moribund   Past Medical History  Diagnosis Date  .   07/20/2006  . BACK PAIN, LOW 07/20/2006  . BREAST LUMP 09/05/2007    "benign"  . CONSTIPATION, CHRONIC 08/10/2009  . DIVERTICULOSIS, COLON 08/10/2009  . Hemoptysis 08/27/2008  . HYPERLIPIDEMIA 05/08/2007  . HYPERLIPIDEMIA     "not anymore" (08/26/2013)  . INSOMNIA NOS 07/20/2006  . 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 OBESITY, NOS 07/20/2006  . Pulmonary nodule   . Heart murmur   . GERD (gastroesophageal reflux disease)   . Pneumonia 2014    "once" (08/26/2013)  . MIGRAINE, COMMON 05/08/2007    "haven't had one in ~ 6 months" (08/26/2013)  . Lung cancer   . Chest pain   .  Dyspnea on exertion   . Tobacco abuse     Past Surgical History  Procedure Laterality Date  . Laparoscopic gastric banding  ~ 2006  . Colonoscopy    . Bronchoscopy  08/26/2013  . Abdominal hysterectomy  1990's  . Tubal ligation  1980's  . Shoulder arthroscopy w/ rotator cuff repair Left 2014  . Wrist fracture surgery Left ~ 2010  . Video bronchoscopy N/A 08/26/2013    Procedure: VIDEO BRONCHOSCOPY;  Surgeon: Melrose Nakayama, MD;  Location: [facility] OR;  Service: Thoracic;  Laterality: N/A;    Family History  Problem Relation Age of Onset  . Cancer Mother   . Diabetes Mother   . Heart disease Mother   . Cancer Father   . Diabetes Sister   . Heart attack Brother   . Heart disease Brother     History   Social History  . Marital Status: Divorced    Spouse Name: N/A    Number of Children: N/A  . Years of Education: N/A   Occupational History  . Not on file.   Social History Main Topics  . Smoking status: Current Every Day Smoker -- 1.00 packs/day for 44 years    Types: Cigarettes  . Smokeless tobacco: Never Used  . Alcohol Use: No  . Drug Use: No  .  Sexual Activity: Not Currently   Other Topics Concern  . Not on file   Social History Narrative  . No narrative on file    History  Smoking status  . Current Every Day Smoker -- 1.00 packs/day for 44 years  . Types: Cigarettes  Smokeless tobacco  . Never Used    History  Alcohol Use No     No Known Allergies  Current Outpatient Prescriptions  Medication Sig Dispense Refill  . amitriptyline (ELAVIL) 100 MG tablet Take 1 tablet (100 mg total) by mouth at bedtime.  90 tablet  1  . ibuprofen (ADVIL,MOTRIN) 800 MG tablet Take 800 mg by mouth every 8 (eight) hours as needed for mild pain.        No current facility-administered medications for this visit.     Review of Systems:     Cardiac Review of Systems: Y or N  Chest Pain [  n  ]  Resting SOB [n   ] Exertional SOB  [  y]  Orthopnea Florencio.Farrier  ]   Pedal Edema [ y  ]    Palpitations [ n ] Syncope  [n]   Presyncope [ n  ]  General Review of Systems: [Y] = yes [  ]=no Constitional: recent weight change [n  ];  Wt loss over the last 3 months [   ] anorexia [  ]; fatigue [ y ]; nausea [  ]; night sweats [  ]; fever [  ]; or chills [  ];          Dental: poor dentition[ n ]; Last Dentist visit:   Eye : blurred vision [  ]; diplopia [   ]; vision changes [  ];  Amaurosis fugax[  ]; Resp: cough [ y ];  wheezing[ y ];  hemoptysis[ years ago ]; shortness of breath[  y]; paroxysmal nocturnal dyspnea[ y ]; dyspnea on exertion[  ]; or orthopnea[  ];  GI:  gallstones[  ], vomiting[  ];  dysphagia[  ]; melena[  ];  hematochezia [  ]; heartburn[  ];   Hx of  Colonoscopy[  ]; GU: kidney stones [  ];  hematuria[  ];   dysuria [  ];  nocturia[  ];  history of     obstruction [  ]; urinary frequency [  ]             Skin: rash, swelling[  ];, hair loss[  ];  peripheral edema[  ];  or itching[  ]; Musculosketetal: myalgias[  ];  joint swelling[y  ];  joint erythema[  ];  joint pain[  ];  back pain[y  ];  Heme/Lymph: bruising[  ];  bleeding[  ];   anemia[  ];  Neuro: TIA[n  ];  headaches[ n ];  stroke[  ];  vertigo[  ];  seizures[  ];   paresthesias[  ];  difficulty walking[ n ];  Psych:depression[  ]; anxiety[  ];  Endocrine: diabetes[  ];  thyroid dysfunction[  ];  Immunizations: Flu up to date [ y ]; Pneumococcal up to date [facility]  ];  Other:  Physical Exam: BP 123/82  Pulse 81  Resp 16  Ht 5\' 1"  (1.549 m)  Wt 220 lb (99.791 kg)  BMI 41.59 kg/m2  SpO2 98%  PHYSICAL EXAMINATION:  General appearance: alert, cooperative, appears older than stated age and moderately obese Neurologic: intact Heart: 2/6 early systolic murmur Lungs: Patient has distant breath sounds bilaterally, she's not actively wheezing  Abdomen: Moderately obese abdomen without masses Extremities: She has no evidence of DVT or pedal edema Do not appreciate any cervical or supraclavicular adenopathy    Diagnostic Studies & Laboratory data:     Recent Radiology Findings:   Dg Chest 2 View  08/27/2013   CLINICAL DATA:  Cough, possible aspiration  EXAM: CHEST  2 VIEW  COMPARISON:  08/26/2013  FINDINGS: Right subclavian line tip projecting over SVC confluence.  Enlargement of cardiac silhouette with pulmonary vascular congestion.  Tortuous aorta.  Improved perihilar edema.  No gross pleural effusion or pneumothorax.  IMPRESSION: Improved pulmonary edema.   Electronically Signed   By: Lavonia Dana M.D.   On: 08/27/2013 08:08     RADIOLOGY REPORT*  Clinical Data: Improvement in neck pain. Post flexible laryngoscopy  without abnormality noted. Reevaluate possible piriform sinus  mass.  CT NECK WITH CONTRAST  Technique: Multidetector CT imaging of the neck was performed with  intravenous contrast.  Contrast: 87mL OMNIPAQUE IOHEXOL 300 MG/ML SOLN  Comparison: 06/25/2012.  Findings: Left piriform sinus is now aerated and relatively  symmetric when compared to the right without mass identified.  Within the visualized upper lung zones, emphysematous changes are    noted with small pleural effusions greater on the left.  Additionally, scattered nodules are noted measuring 5 mm right  upper lobe, 6 mm left upper lobe and 4 mm superior segment left  lower lobe. These are not completely assessed on present  examination.  If the patient is at high risk for bronchogenic carcinoma, follow-  up chest CT at 6-12 months is recommended. If the patient is at  low risk for bronchogenic carcinoma, follow-up chest CT at 12  months is recommended. This recommendation follows the consensus  statement: Guidelines for Management of Small Pulmonary Nodules  Detected on CT Scans: A Statement from the [place] as  published in Radiology 2005; 237:395-400.  Retropharyngeal fluid collection remains with maximal thickness at  the C3 level with AP dimension of 3.8 mm.  Haziness of fat planes along the left supraclavicular region and  left chest wall with left supraclavicular adenopathy. Scattered  lymph nodes throughout the neck  largest in the left level II region  measuring 1.1 x 1.5 x 1.7 cm.  Etiology of these findings is indeterminate. Considerations include  infectious process, inflammatory process, angioedema or result of  vascular engorgement.  Visualized intracranial structures and orbital structures  unremarkable.  No primary parotid, submandibular, posterior-superior  nasopharyngeal, palatine tonsil, tongue base, glottic or thyroid  lesion noted.  Atherosclerotic type changes with calcification aortic arch and  carotid bifurcation without hemodynamically significant stenosis.  No calcification of the longus colonic. Scattered cervical  spondylotic changes without bony destructive lesion.  IMPRESSION:  Left piriform sinus is now aerated and relatively symmetric when  compared to the right without mass identified.  Retropharyngeal fluid collection remains with maximal thickness at  the C3 level with AP dimension of 3.8 mm.  Haziness of fat planes  along the left supraclavicular region and  left chest wall with left supraclavicular adenopathy. Scattered  lymph nodes throughout the neck largest in the left level II region  measuring 1.1 x 1.5 x 1.7 cm.  Etiology of these findings is indeterminate. Considerations include  infectious process, inflammatory process, angioedema or result of  vascular engorgement. Result of neoplasm felt to be a less likely  consideration.  Within the visualized upper lung zones, emphysematous changes are  noted with small pleural effusions greater on the left.  Additionally, scattered nodules are noted measuring 5 mm right  upper lobe, 6 mm left upper lobe and 4 mm superior segment left  lower lobe. These are not completely assessed on present  examination.  If the patient is at high risk for bronchogenic carcinoma, follow-  up chest CT at 6-12 months is recommended. If the patient is at  low risk for bronchogenic carcinoma, follow-up chest CT at 12  months is recommended. This recommendation follows the consensus  statement: Guidelines for Management of Small Pulmonary Nodules  Detected on CT Scans: A Statement from the [place] as  published in Radiology 2005; 237:395-400.  Original Report Authenticated By: Genia Del, M.D.  EG:[id number] DATA: Nonproductive cough. Night sweats. Recent treatment  for pneumonia.  EXAM:  CT CHEST WITHOUT CONTRAST  TECHNIQUE:  Multidetector CT imaging of the chest was performed following the  standard protocol without IV contrast.  COMPARISON: PA and lateral chest earlier this same day, 02/27/2013  hand 05/12/2009. CT chest 12/24/2003.  FINDINGS:  There is no axillary, hilar or mediastinal lymphadenopathy. Pleural  or pericardial effusion. Heart size is normal. The esophagus is  mildly dilated with an air-fluid level. Lap band is noted. Lungs  demonstrate fairly extensive centrilobular emphysematous disease.  Small focus of micro nodularity is identified  in the right lung apex  most consistent with prior inflammatory change. No consolidative  process is present. There is a left upper lobe nodule measuring 0.9  cm in diameter on image 14. A 0.4 cm left upper lobe nodule is seen  image 19. The lungs are otherwise unremarkable. Incidentally imaged  upper abdomen demonstrates no focal abnormality. No focal bony  abnormality is identified.  IMPRESSION:  Negative for pneumonia.  0.9 cm left upper lobe nodule could be secondary to bronchogenic  carcinoma. PET CT scan is recommended for further evaluation.  Mildly dilated esophagus with an air-fluid level may be secondary to  the patient's lap band.   CLINICAL DATA: Followup of pulmonary nodule. History of hemoptysis.  Gastric banding. Obesity.  EXAM:  CT CHEST WITHOUT CONTRAST  TECHNIQUE:  Multidetector CT imaging of the chest was performed using thin slice  collimation for electromagnetic bronchoscopy planning purposes,  without intravenous contrast.  COMPARISON: DG RIBS UNILATERAL W/CHEST*R* dated 06/01/2013; NM PET  IMAGE INITIAL (PI) SKULL BASE TO THIGH dated 04/30/2013; CT CHEST W/O  CM dated 04/04/2013  FINDINGS:  Lungs/Pleura: Moderate centrilobular emphysema. Similar subpleural 3  mm right apical lung nodule on image 7.  Posterior left upper lobe pulmonary nodule measures 11 x 11 mm on  image 13 and has enlarged from the prior exam, where it measured 9 x  9 mm (when remeasured).  3 mm subpleural nodule in the left upper lobe on image 18 is less  conspicuous in on the prior exam. There is an adjacent more cephalad  3 mm nodule which is likely new but also favored to represent a  subpleural lymph node.  3 mm left lower lobe nodule on image 29 demonstrates central  cavitation and is not readily apparent on the prior.  Subpleural 3 mm superior segment left lower lobe nodule on image 13  is likely new. No pleural fluid.  Heart/Mediastinum: Normal heart size, without pericardial  effusion.  No mediastinal or definite hilar adenopathy, given limitations of  unenhanced CT. Small hiatal hernia. Dilated esophagus with fluid  level within.  Upper Abdomen: Suspicion of a hypo attenuating left liver lobe  lesion at 1.6 cm on image 50/series 2. Likely similar on image 115  of the prior PET. Indeterminate. Lap band. Normal imaged adrenal  glands. A right-sided fat containing Bochdalek's hernia.  Bones/Musculoskeletal: Multiple healing anterior right rib  fractures, new since 04/04/2013.  IMPRESSION:  1. Mild but definite enlargement of a left upper lobe pulmonary  nodule since 04/04/2013. This is highly suspicious for primary  bronchogenic carcinoma.  2. Centrilobular emphysema. Scattered smaller nodules. Some are  larger and some are smaller. Primarily felt to represent benign  entities such as subpleural lymph nodes. A tiny but cavitary left  lower lobe nodule warrant special followup attention.  3. Suspicion of a low-density left hepatic lobe lesion. This was  present on the 04/30/2013 PET and is not definitely hypermetabolic.  Recommend either attention on followup or definitive  characterization with dedicated abdominal MRI.  4. Lap band in place. Esophageal dilatation and fluid level could be  secondary or represent dysmotility.  5. Interval nonacute anterior right rib fractures.  Electronically Signed  By: Abigail Miyamoto M.D.  On: 07/29/2013 15:18  PET: CLINICAL DATA: The initial treatment strategy for pulmonary nodule  on chest CT. Nonproductive cough with night sweats. No given history  of malignancy.  EXAM:  NUCLEAR MEDICINE PET SKULL BASE TO THIGH  FASTING BLOOD GLUCOSE: Value: 82mg /dl  TECHNIQUE:  18.5 mCi F-18 FDG was injected intravenously. CT data was obtained  and used for attenuation correction and anatomic localization only.  (This was not acquired as a diagnostic CT examination.) Additional  exam technical data entered on technologist worksheet.   COMPARISON: Chest CT 04/04/2013.  FINDINGS:  NECK  No hypermetabolic lymph nodes in the neck.  CHEST  There are no hypermetabolic mediastinal, hilar or axillary lymph  nodes. The left upper lobe 9 mm pulmonary nodule does not show  significantly increased metabolic activity. This is seen on image 63  and has an SUV max of 1.8, similar to background soft tissue  activity. No enlarging pulmonary nodules are identified. Scattered  smaller nodules are present bilaterally. In addition moderate  emphysematous changes are present throughout both lungs.  ABDOMEN/PELVIS  There is no abnormal metabolic activity within the liver, spleen,  adrenal  glands or pancreas. There are no hypermetabolic abdominal  pelvic lymph nodes. A hiatal hernia and gastric laparoscopic band  are noted.  SKELETON  No focal hypermetabolic activity to suggest skeletal metastasis.  IMPRESSION:  1. The left upper lobe pulmonary nodule and additional smaller  pulmonary nodules do not show significantly increased metabolic  activity. This is a reassuring finding, although does not completely  exclude low grade neoplasm. Chest CT follow-up in 6 months  recommended.  2. No hypermetabolic nodal activity within the neck, chest, abdomen  or pelvis.  Electronically Signed  By: Camie Patience M.D.  On: 04/30/2013 13:24  Recent Lab Findings: Lab Results  Component Value Date   WBC 16.3* 08/27/2013   HGB 11.2* 08/27/2013   HCT 34.4* 08/27/2013   PLT 275 08/27/2013   GLUCOSE 129* 08/27/2013   CHOL 214* 06/07/2011   TRIG 63.0 06/07/2011   HDL 63.70 06/07/2011   LDLDIRECT 130.4 06/07/2011   LDLCALC  Value: 102        Total Cholesterol/HDL:CHD Risk Coronary Heart Disease Risk Table                     Men   Women  1/2 Average Risk   3.4   3.3  Average Risk       5.0   4.4  2 X Average Risk   9.6   7.1  3 X Average Risk  23.4   11.0        Use the calculated Patient Ratio above and the CHD Risk Table to determine the patient's CHD Risk.         ATP III CLASSIFICATION (LDL):  <100     mg/dL   Optimal  100-129  mg/dL   Near or Above                    Optimal  130-159  mg/dL   Borderline  160-189  mg/dL   High  >190     mg/dL   Very High* 05/13/2009   ALT 18 08/22/2013   AST 23 08/22/2013   NA 141 08/27/2013   K 3.1* 08/27/2013   CL 101 08/27/2013   CREATININE 0.61 08/27/2013   BUN 7 08/27/2013   CO2 28 08/27/2013   TSH 1.18 06/07/2011   INR 0.98 08/22/2013   HGBA1C  Value: 5.8 (NOTE) The ADA recommends the following therapeutic goal for glycemic control related to Hgb A1c measurement: Goal of therapy: <6.5 Hgb A1c  Reference: American Diabetes Association: Clinical Practice Recommendations 2010, Diabetes Care, 2010, 33: (Suppl  1). 05/12/2009   Stress Test: Impression  Exercise Capacity: Lexiscan with no exercise.  BP Response: Normal blood pressure response.  Clinical Symptoms: No significant symptoms noted.  ECG Impression: No significant ST segment change suggestive of ischemia.  Comparison with Prior Nuclear Study: No images to compare  Overall Impression: Normal stress nuclear study.  LV Wall Motion: NL LV Function; NL Wall Motion  Lorretta Harp, MD  10/15/2013 5:13 PM  ECHO:Study Conclusions  - Left ventricle: The cavity size was normal. Wall thickness was normal. Systolic function was normal. The estimated ejection fraction was in the range of 55% to 60%. Wall motion was normal; there were no regional wall motion abnormalities. Doppler parameters are consistent with abnormal left ventricular relaxation (grade 1 diastolic dysfunction). - Aortic valve: There was mild to moderate regurgitation. - Mitral valve: There was mild regurgitation.  -------------------------------------------------------------------  ------------------------------------------------------------------- Left ventricle: The cavity size was  normal. Wall thickness was normal. Systolic function was normal. The estimated ejection fraction was in the range of  55% to 60%. Wall motion was normal; there were no regional wall motion abnormalities. Doppler parameters are consistent with abnormal left ventricular relaxation (grade 1 diastolic dysfunction).  ------------------------------------------------------------------- Aortic valve: Mildly thickened leaflets. Cusp separation was normal. Doppler: Transvalvular velocity was within the normal range. There was no stenosis. There was mild to moderate regurgitation. Peak gradient (S): 11 mm Hg.  ------------------------------------------------------------------- Aorta: The aorta was normal, not dilated, and non-diseased.  ------------------------------------------------------------------- Mitral valve: Doppler: There was mild regurgitation.  ------------------------------------------------------------------- Left atrium: LA Volume/BSA= 21.8 ml/m2. The atrium was normal in size.  ------------------------------------------------------------------- Right ventricle: The cavity size was normal. Wall thickness was normal. Systolic function was normal.  ------------------------------------------------------------------- Pulmonic valve: Poorly visualized. Structurally normal valve. Cusp separation was normal. Doppler: Transvalvular velocity was within the normal range. There was trivial regurgitation.  ------------------------------------------------------------------- Tricuspid valve: Structurally normal valve. Leaflet separation was normal. Doppler: Transvalvular velocity was within the normal range. There was no regurgitation.  ------------------------------------------------------------------- Pulmonary artery: The main pulmonary artery was normal-sized.  ------------------------------------------------------------------- Right atrium: The atrium was normal in size.  ------------------------------------------------------------------- Pericardium: The pericardium was normal in appearance.  There was no pericardial effusion.  ------------------------------------------------------------------- Systemic veins: Inferior vena cava: The vessel was normal in size. The respirophasic diameter changes were in the normal range (= 50%), consistent with normal central venous pressure. Diameter: 14.4 mm.  ------------------------------------------------------------------- Post procedure conclusions Ascending Aorta:  - The aorta was normal, not dilated, and non-diseased.  ------------------------------------------------------------------- Prepared and Electronically Authenticated by  Candee Furbish, M.D. 2015-05-26T11:51:33  -------------------------------------------------------------------   PFT's  08/06/2013 FEV1 2.11 112%  DLCO 16   79%   Assessment / Plan:   Patient with moderate emphysema an enlarging left upper lobe lung nodule though not hypermetabolic.  Resection of the left upper lobe lung nodule was recommended to the patient, after intubation she aspirated and the suture was aborted. She returned to see me for a different surgeon to proceed with surgery. The patient has had  echocardiogram and and stress test by Dr. Gwenlyn Found prior to surgical intervention  Plan to proceed with bronchoscopy left video-assisted thoracoscopy and wedge resection of left lung nodule and if malignant probable lobectomy on June 8 The risks and options of surgical resection have been discussed with the patient in detail and she is agreeable with proceeding.   [name] MD   [address] [place] [address] Office [phone]   Beeper [phone]  10/17/2013 1:00 PM

## 2013-10-21 ENCOUNTER — Encounter (HOSPITAL_COMMUNITY): Payer: Self-pay | Admitting: Pharmacist

## 2013-10-23 ENCOUNTER — Telehealth: Payer: Self-pay | Admitting: *Deleted

## 2013-10-23 NOTE — Telephone Encounter (Signed)
Spoke to patient. Echo and myoview Result given . Verbalized understanding Patient already schedule for her procedure 10/28/13

## 2013-10-23 NOTE — Telephone Encounter (Signed)
Message copied by Raiford Simmonds on Wed Oct 23, 2013  9:41 AM ------      Message from: Lorretta Harp      Created: Wed Oct 16, 2013  4:29 PM       Call and tell normal ------

## 2013-10-24 ENCOUNTER — Encounter (HOSPITAL_COMMUNITY): Payer: Self-pay

## 2013-10-24 ENCOUNTER — Encounter (HOSPITAL_COMMUNITY)
Admission: RE | Admit: 2013-10-24 | Discharge: 2013-10-24 | Disposition: A | Discharge status: Home / Self Care | Payer: BC Managed Care – PPO | Source: Ambulatory Visit | Attending: Cardiothoracic Surgery | Admitting: Cardiothoracic Surgery

## 2013-10-24 VITALS — BP 137/85 | HR 85 | Temp 97.6°F | Resp 18 | Ht 61.0 in | Wt 228.9 lb

## 2013-10-24 DIAGNOSIS — Z01812 Encounter for preprocedural laboratory examination: Secondary | ICD-10-CM | POA: Insufficient documentation

## 2013-10-24 DIAGNOSIS — R911 Solitary pulmonary nodule: Secondary | ICD-10-CM

## 2013-10-24 HISTORY — DX: Unspecified osteoarthritis, unspecified site: M19.90

## 2013-10-24 HISTORY — DX: Chronic obstructive pulmonary disease, unspecified: J44.9

## 2013-10-24 LAB — CBC
HCT: 37.5 % (ref 36.0–46.0)
Hemoglobin: 12.5 g/dL (ref 12.0–15.0)
MCH: 31.2 pg (ref 26.0–34.0)
MCHC: 33.3 g/dL (ref 30.0–36.0)
MCV: 93.5 fL (ref 78.0–100.0)
Platelets: 290 10*3/uL (ref 150–400)
RBC: 4.01 MIL/uL (ref 3.87–5.11)
RDW: 15.4 % (ref 11.5–15.5)
WBC: 7.5 10*3/uL (ref 4.0–10.5)

## 2013-10-24 LAB — BLOOD GAS, ARTERIAL
Acid-base deficit: 1.4 mmol/L (ref 0.0–2.0)
Bicarbonate: 23.2 mEq/L (ref 20.0–24.0)
Drawn by: 206361
FIO2: 0.21 %
O2 Saturation: 94.7 %
Patient temperature: 98.6
TCO2: 24.5 mmol/L (ref 0–100)
pCO2 arterial: 41.6 mmHg (ref 35.0–45.0)
pH, Arterial: 7.365 (ref 7.350–7.450)
pO2, Arterial: 77.2 mmHg — ABNORMAL LOW (ref 80.0–100.0)

## 2013-10-24 LAB — COMPREHENSIVE METABOLIC PANEL
ALT: 24 U/L (ref 0–35)
AST: 28 U/L (ref 0–37)
Albumin: 3.7 g/dL (ref 3.5–5.2)
Alkaline Phosphatase: 99 U/L (ref 39–117)
BUN: 13 mg/dL (ref 6–23)
CO2: 19 mEq/L (ref 19–32)
Calcium: 11 mg/dL — ABNORMAL HIGH (ref 8.4–10.5)
Chloride: 107 mEq/L (ref 96–112)
Creatinine, Ser: 0.61 mg/dL (ref 0.50–1.10)
GFR calc Af Amer: >90 mL/min (ref >90)
GFR calc non Af Amer: >90 mL/min (ref >90)
Glucose, Bld: 130 mg/dL — ABNORMAL HIGH (ref 70–99)
Potassium: 5 mEq/L (ref 3.7–5.3)
Sodium: 140 mEq/L (ref 137–147)
Total Bilirubin: <0.2 mg/dL — ABNORMAL LOW (ref 0.3–1.2)
Total Protein: 7.3 g/dL (ref 6.0–8.3)

## 2013-10-24 LAB — URINALYSIS, ROUTINE W REFLEX MICROSCOPIC
Bilirubin Urine: NEGATIVE
Glucose, UA: NEGATIVE mg/dL
Hgb urine dipstick: NEGATIVE
Ketones, ur: NEGATIVE mg/dL
Leukocytes, UA: NEGATIVE
Nitrite: NEGATIVE
Protein, ur: NEGATIVE mg/dL
Specific Gravity, Urine: 1.016 (ref 1.005–1.030)
Urobilinogen, UA: 0.2 mg/dL (ref 0.0–1.0)
pH: 5 (ref 5.0–8.0)

## 2013-10-24 LAB — SURGICAL PCR SCREEN
MRSA, PCR: POSITIVE — AB
Staphylococcus aureus: POSITIVE — AB

## 2013-10-24 LAB — TYPE AND SCREEN
ABO/RH(D): O POS
Antibody Screen: NEGATIVE

## 2013-10-24 LAB — PROTIME-INR
INR: 0.94 (ref 0.00–1.49)
Prothrombin Time: 12.4 seconds (ref 11.6–15.2)

## 2013-10-24 LAB — APTT: aPTT: 27 seconds (ref 24–37)

## 2013-10-24 NOTE — Progress Notes (Signed)
Pt denies SOB, chest pain and being under the care of a cardiologist. Pt made aware to stop taking Aspirin, vitamins , and herbal medications. Do not take any NSAIDs ie: Ibuprofen, Advil, Naproxen or any medication containing Aspirin.

## 2013-10-24 NOTE — Pre-Procedure Instructions (Signed)
Lauren Campos  10/24/2013   Your procedure is scheduled on: Monday, October 28, 2013  Report to [place] Stay (use Main Entrance "A'') at 7:30 AM.  Call this number if you have problems the morning of surgery: [phone]   Remember:   Do not eat food or drink liquids after midnight.   Take these medicines the morning of surgery with A SIP OF WATER: None   Do not wear jewelry, make-up or nail polish.  Do not wear lotions, powders, or perfumes. You may NOT wear deodorant.  Do not shave 48 hours prior to surgery.   Do not bring valuables to the hospital.  [facility] is not responsible  for any belongings or valuables.               Contacts, dentures or bridgework may not be worn into surgery.  Leave suitcase in the car. After surgery it may be brought to your room.  For patients admitted to the hospital, discharge time is determined by your treatment team.               Patients discharged the day of surgery will not be allowed to drive home.  Name and phone number of your driver:   Special Instructions:  Special Instructions:Special Instructions: [facility] - Preparing for Surgery  Before surgery, you can play an important role.  Because skin is not sterile, your skin needs to be as free of germs as possible.  You can reduce the number of germs on you skin by washing with CHG (chlorahexidine gluconate) soap before surgery.  CHG is an antiseptic cleaner which kills germs and bonds with the skin to continue killing germs even after washing.  Please DO NOT use if you have an allergy to CHG or antibacterial soaps.  If your skin becomes reddened/irritated stop using the CHG and inform your nurse when you arrive at Short Stay.  Do not shave (including legs and underarms) for at least 48 hours prior to the first CHG shower.  You may shave your face.  Please follow these instructions carefully:   1.  Shower with CHG Soap the night before surgery and the morning of Surgery.  2.  If  you choose to wash your hair, wash your hair first as usual with your normal shampoo.  3.  After you shampoo, rinse your hair and body thoroughly to remove the Shampoo.  4.  Use CHG as you would any other liquid soap.  You can apply chg directly  to the skin and wash gently with scrungie or a clean washcloth.  5.  Apply the CHG Soap to your body ONLY FROM THE NECK DOWN.  Do not use on open wounds or open sores.  Avoid contact with your eyes, ears, mouth and genitals (private parts).  Wash genitals (private parts) with your normal soap.  6.  Wash thoroughly, paying special attention to the area where your surgery will be performed.  7.  Thoroughly rinse your body with warm water from the neck down.  8.  DO NOT shower/wash with your normal soap after using and rinsing off the CHG Soap.  9.  Pat yourself dry with a clean towel.            10.  Wear clean pajamas.            11.  Place clean sheets on your bed the night of your first shower and do not sleep with pets.  Day  of Surgery  Do not apply any lotions/deodorants the morning of surgery.  Please wear clean clothes to the hospital/surgery center.   Please read over the following fact sheets that you were given: Pain Booklet, Coughing and Deep Breathing, Blood Transfusion Information, MRSA Information and Surgical Site Infection Prevention

## 2013-10-25 NOTE — Progress Notes (Signed)
Anesthesia Chart Review: Patient is a 58 year old female scheduled for bronchoscopy, left VATS, lung resection on 10/28/13 by Dr. Servando Snare. She has an enlarging LUL lung nodule, although is was not hypermetabolic by 22/6333 PET scan. Procedure was initially scheduled on 08/26/13 with Dr. Roxan Hockey but patient aspirated a large amount of gastric contents on induction. Bronchoscopy was performed to clear airway but it was felt best not to proceed with lung resection at that time. She had reported being NPO since 10 PM the night before surgery, so it was thought she may have had significant reflux. The anesthesiologist at the time recommended clear liquids 24 hours preoperatively. However, notes later indicate that fluid had been placed in her lap band just days before surgery.  She later requested a second opinion with Dr. Servando Snare in regards to surgery.  He referred her to cardiology (Dr. Gwenlyn Found) and back to Dr. Hassell Done and all the fluid was removed from the band on 09/05/13 to minimize its effect on anesthesia.   History includes smoking hyperlipidemia migraines, anxiety, GERD, heart murmur (mild to moderate AR, mild MR by 09/2013 echo), hemoptysis '10, laparoscopic gastric banding, hysterectomy. BMI 43 consistent with morbid obesity. PCP is listed as Dr. Salli Real. Pulmonologist is Dr. Chase Caller.   EKG on 10/02/13 showed NSR.   Nuclear stress test on 10/15/13 was normal, EF 59%.   Echo on 10/15/13 showed: - Left ventricle: The cavity size was normal. Wall thickness was normal. Systolic function was normal. The estimated ejection fraction was in the range of 55% to 60%. Wall motion was normal; there were no regional wall motion abnormalities. Doppler parameters are consistent with abnormal left ventricular relaxation (grade 1 diastolic dysfunction). - Aortic valve: There was mild to moderate regurgitation. - Mitral valve: There was mild regurgitation.   PFT 08/05/2013  FVC 2.53(105%)  FEV1 2.11(112%)   DLCO 79%   Preoperative labs ntoed.    She'll get a CXR on the day of surgery.  I reviewed her history of aspiration with induction in 08/2013 with anesthesiologist Dr. Linna Caprice. Dr. Chriss Driver had previously recommended CL for 24 hours preoperatively, but now her gastric band reservoir has been aspirated so hopefully that should decrease her risk for recurrent aspiration.  Dr. Linna Caprice recommended clear liquids following lunch on 10/27/13 and then NPO after MN as planned.  Patient was notified.  George Hugh [facility] Short Stay Center/Anesthesiology Phone [phone] 10/25/2013 6:31 PM

## 2013-10-27 MED ORDER — DEXTROSE 5 % IV SOLN
1.5000 g | INTRAVENOUS | Status: AC
  Administered 2013-10-28: 1.5 g via INTRAVENOUS
  Filled 2013-10-27: qty 1.5

## 2013-10-27 NOTE — Progress Notes (Signed)
Cleared for procedure

## 2013-10-28 ENCOUNTER — Inpatient Hospital Stay (HOSPITAL_COMMUNITY): Payer: BC Managed Care – PPO

## 2013-10-28 ENCOUNTER — Encounter (HOSPITAL_COMMUNITY)
Admission: RE | Disposition: A | Discharge status: Home / Self Care | Payer: Self-pay | Source: Ambulatory Visit | Attending: Cardiothoracic Surgery

## 2013-10-28 ENCOUNTER — Inpatient Hospital Stay (HOSPITAL_COMMUNITY): Payer: BC Managed Care – PPO | Admitting: Certified Registered Nurse Anesthetist

## 2013-10-28 ENCOUNTER — Inpatient Hospital Stay (HOSPITAL_COMMUNITY)
Admission: RE | Admit: 2013-10-28 | Discharge: 2013-11-02 | DRG: 164 | Disposition: A | Discharge status: Home / Self Care | Payer: BC Managed Care – PPO | Source: Ambulatory Visit | Attending: Cardiothoracic Surgery | Admitting: Cardiothoracic Surgery

## 2013-10-28 ENCOUNTER — Encounter (HOSPITAL_COMMUNITY): Payer: BC Managed Care – PPO | Admitting: Vascular Surgery

## 2013-10-28 ENCOUNTER — Encounter (HOSPITAL_COMMUNITY): Payer: Self-pay | Admitting: *Deleted

## 2013-10-28 DIAGNOSIS — Z79899 Other long term (current) drug therapy: Secondary | ICD-10-CM

## 2013-10-28 DIAGNOSIS — R911 Solitary pulmonary nodule: Secondary | ICD-10-CM

## 2013-10-28 DIAGNOSIS — E785 Hyperlipidemia, unspecified: Secondary | ICD-10-CM | POA: Diagnosis present

## 2013-10-28 DIAGNOSIS — Z6841 Body Mass Index (BMI) 40.0 and over, adult: Secondary | ICD-10-CM

## 2013-10-28 DIAGNOSIS — K5900 Constipation, unspecified: Secondary | ICD-10-CM | POA: Diagnosis not present

## 2013-10-28 DIAGNOSIS — G8918 Other acute postprocedural pain: Secondary | ICD-10-CM | POA: Diagnosis not present

## 2013-10-28 DIAGNOSIS — J438 Other emphysema: Secondary | ICD-10-CM | POA: Diagnosis present

## 2013-10-28 DIAGNOSIS — R918 Other nonspecific abnormal finding of lung field: Secondary | ICD-10-CM | POA: Diagnosis present

## 2013-10-28 DIAGNOSIS — G4700 Insomnia, unspecified: Secondary | ICD-10-CM | POA: Diagnosis present

## 2013-10-28 DIAGNOSIS — R222 Localized swelling, mass and lump, trunk: Secondary | ICD-10-CM

## 2013-10-28 DIAGNOSIS — Z9851 Tubal ligation status: Secondary | ICD-10-CM

## 2013-10-28 DIAGNOSIS — Z833 Family history of diabetes mellitus: Secondary | ICD-10-CM

## 2013-10-28 DIAGNOSIS — K219 Gastro-esophageal reflux disease without esophagitis: Secondary | ICD-10-CM | POA: Diagnosis present

## 2013-10-28 DIAGNOSIS — F17200 Nicotine dependence, unspecified, uncomplicated: Secondary | ICD-10-CM | POA: Diagnosis present

## 2013-10-28 DIAGNOSIS — C3410 Malignant neoplasm of upper lobe, unspecified bronchus or lung: Principal | ICD-10-CM | POA: Diagnosis present

## 2013-10-28 DIAGNOSIS — K5730 Diverticulosis of large intestine without perforation or abscess without bleeding: Secondary | ICD-10-CM | POA: Diagnosis present

## 2013-10-28 DIAGNOSIS — Z8249 Family history of ischemic heart disease and other diseases of the circulatory system: Secondary | ICD-10-CM

## 2013-10-28 HISTORY — PX: VIDEO BRONCHOSCOPY: SHX5072

## 2013-10-28 HISTORY — DX: Malignant neoplasm of unspecified part of unspecified bronchus or lung: C34.90

## 2013-10-28 HISTORY — PX: VIDEO ASSISTED THORACOSCOPY (VATS)/WEDGE RESECTION: SHX6174

## 2013-10-28 LAB — GLUCOSE, CAPILLARY: Glucose-Capillary: 110 mg/dL — ABNORMAL HIGH (ref 70–99)

## 2013-10-28 SURGERY — BRONCHOSCOPY, VIDEO-ASSISTED
Anesthesia: General | Site: Chest

## 2013-10-28 MED ORDER — TRAMADOL HCL 50 MG PO TABS
50.0000 mg | ORAL_TABLET | Freq: Four times a day (QID) | ORAL | Status: DC | PRN
  Administered 2013-10-31: 100 mg via ORAL
  Filled 2013-10-28: qty 2

## 2013-10-28 MED ORDER — FENTANYL CITRATE 0.05 MG/ML IJ SOLN
INTRAMUSCULAR | Status: AC
  Administered 2013-10-28: 100 ug via INTRAVENOUS
  Filled 2013-10-28: qty 2

## 2013-10-28 MED ORDER — LACTATED RINGERS IV SOLN
INTRAVENOUS | Status: DC | PRN
  Administered 2013-10-28 (×2): via INTRAVENOUS

## 2013-10-28 MED ORDER — NALOXONE HCL 0.4 MG/ML IJ SOLN
0.4000 mg | INTRAMUSCULAR | Status: DC | PRN
  Filled 2013-10-28: qty 1

## 2013-10-28 MED ORDER — OXYCODONE HCL 5 MG PO TABS
5.0000 mg | ORAL_TABLET | ORAL | Status: DC | PRN
  Administered 2013-10-29 – 2013-11-02 (×12): 10 mg via ORAL
  Filled 2013-10-28 (×12): qty 2

## 2013-10-28 MED ORDER — INSULIN ASPART 100 UNIT/ML ~~LOC~~ SOLN
0.0000 [IU] | Freq: Four times a day (QID) | SUBCUTANEOUS | Status: DC
  Administered 2013-10-29 (×2): 2 [IU] via SUBCUTANEOUS

## 2013-10-28 MED ORDER — SUCCINYLCHOLINE CHLORIDE 20 MG/ML IJ SOLN
INTRAMUSCULAR | Status: AC
  Filled 2013-10-28: qty 1

## 2013-10-28 MED ORDER — PROPOFOL INFUSION 10 MG/ML OPTIME
INTRAVENOUS | Status: DC | PRN
  Administered 2013-10-28: 40 ug/kg/min via INTRAVENOUS

## 2013-10-28 MED ORDER — PHENYLEPHRINE 40 MCG/ML (10ML) SYRINGE FOR IV PUSH (FOR BLOOD PRESSURE SUPPORT)
PREFILLED_SYRINGE | INTRAVENOUS | Status: AC
  Filled 2013-10-28: qty 10

## 2013-10-28 MED ORDER — ONDANSETRON HCL 4 MG/2ML IJ SOLN
INTRAMUSCULAR | Status: AC
  Filled 2013-10-28: qty 2

## 2013-10-28 MED ORDER — FENTANYL CITRATE 0.05 MG/ML IJ SOLN
INTRAMUSCULAR | Status: AC
  Filled 2013-10-28: qty 5

## 2013-10-28 MED ORDER — SUCCINYLCHOLINE CHLORIDE 20 MG/ML IJ SOLN
INTRAMUSCULAR | Status: DC | PRN
  Administered 2013-10-28: 120 mg via INTRAVENOUS

## 2013-10-28 MED ORDER — FENTANYL CITRATE 0.05 MG/ML IJ SOLN
INTRAMUSCULAR | Status: DC | PRN
  Administered 2013-10-28: 100 ug via INTRAVENOUS
  Administered 2013-10-28 (×3): 50 ug via INTRAVENOUS

## 2013-10-28 MED ORDER — LACTATED RINGERS IV SOLN
INTRAVENOUS | Status: DC
  Administered 2013-10-28: 08:00:00 via INTRAVENOUS

## 2013-10-28 MED ORDER — PROPOFOL 10 MG/ML IV BOLUS
INTRAVENOUS | Status: AC
Start: 2013-10-28 — End: 2013-10-28
  Filled 2013-10-28: qty 20

## 2013-10-28 MED ORDER — ROCURONIUM BROMIDE 50 MG/5ML IV SOLN
INTRAVENOUS | Status: AC
  Filled 2013-10-28: qty 1

## 2013-10-28 MED ORDER — ACETAMINOPHEN 10 MG/ML IV SOLN
1000.0000 mg | Freq: Once | INTRAVENOUS | Status: AC
  Administered 2013-10-28: 1000 mg via INTRAVENOUS

## 2013-10-28 MED ORDER — LACTATED RINGERS IV SOLN
INTRAVENOUS | Status: DC | PRN
  Administered 2013-10-28 (×2): via INTRAVENOUS

## 2013-10-28 MED ORDER — AMITRIPTYLINE HCL 100 MG PO TABS
100.0000 mg | ORAL_TABLET | Freq: Every day | ORAL | Status: DC
  Administered 2013-10-28 – 2013-11-01 (×5): 100 mg via ORAL
  Filled 2013-10-28 (×7): qty 1

## 2013-10-28 MED ORDER — MIDAZOLAM HCL 2 MG/2ML IJ SOLN
1.0000 mg | INTRAMUSCULAR | Status: DC | PRN
  Administered 2013-10-28: 2 mg via INTRAVENOUS

## 2013-10-28 MED ORDER — SODIUM CHLORIDE 0.9 % IJ SOLN
9.0000 mL | INTRAMUSCULAR | Status: DC | PRN
Start: 2013-10-28 — End: 2013-10-31
  Administered 2013-10-30: 9 mL via INTRAVENOUS

## 2013-10-28 MED ORDER — HEMOSTATIC AGENTS (NO CHARGE) OPTIME
TOPICAL | Status: DC | PRN
  Administered 2013-10-28: 1 via TOPICAL

## 2013-10-28 MED ORDER — BETAMETHASONE VALERATE 0.1 % EX OINT: 1.0000 "application " | TOPICAL_OINTMENT | CUTANEOUS | Status: DC

## 2013-10-28 MED ORDER — GLYCOPYRROLATE 0.2 MG/ML IJ SOLN
INTRAMUSCULAR | Status: DC | PRN
  Administered 2013-10-28: .9 mg via INTRAVENOUS

## 2013-10-28 MED ORDER — ROCURONIUM BROMIDE 100 MG/10ML IV SOLN
INTRAVENOUS | Status: DC | PRN
  Administered 2013-10-28: 5 mg via INTRAVENOUS
  Administered 2013-10-28: 10 mg via INTRAVENOUS
  Administered 2013-10-28: 20 mg via INTRAVENOUS
  Administered 2013-10-28: 40 mg via INTRAVENOUS
  Administered 2013-10-28: 15 mg via INTRAVENOUS

## 2013-10-28 MED ORDER — PROPOFOL 10 MG/ML IV BOLUS
INTRAVENOUS | Status: AC
  Filled 2013-10-28: qty 20

## 2013-10-28 MED ORDER — MIDAZOLAM HCL 2 MG/2ML IJ SOLN
INTRAMUSCULAR | Status: AC
  Filled 2013-10-28: qty 2

## 2013-10-28 MED ORDER — OXYCODONE HCL 5 MG/5ML PO SOLN: 5.0000 mg | Freq: Once | ORAL | Status: DC | PRN

## 2013-10-28 MED ORDER — ACETAMINOPHEN 10 MG/ML IV SOLN
INTRAVENOUS | Status: AC
  Administered 2013-10-28: 1000 mg via INTRAVENOUS
  Filled 2013-10-28: qty 100

## 2013-10-28 MED ORDER — SODIUM CHLORIDE 0.9 % IJ SOLN
INTRAMUSCULAR | Status: AC
  Filled 2013-10-28: qty 10

## 2013-10-28 MED ORDER — PHENYLEPHRINE HCL 10 MG/ML IJ SOLN
10.0000 mg | INTRAVENOUS | Status: DC | PRN
  Administered 2013-10-28 (×2): 40 ug/min via INTRAVENOUS

## 2013-10-28 MED ORDER — BISACODYL 5 MG PO TBEC
10.0000 mg | DELAYED_RELEASE_TABLET | Freq: Every day | ORAL | Status: DC
  Administered 2013-10-29 – 2013-11-02 (×5): 10 mg via ORAL
  Filled 2013-10-28 (×5): qty 2

## 2013-10-28 MED ORDER — DEXTROSE-NACL 5-0.9 % IV SOLN
INTRAVENOUS | Status: DC
  Administered 2013-10-28 – 2013-10-29 (×2): via INTRAVENOUS

## 2013-10-28 MED ORDER — DEXTROSE 5 % IV SOLN
1.5000 g | Freq: Two times a day (BID) | INTRAVENOUS | Status: AC
  Administered 2013-10-28 – 2013-10-29 (×2): 1.5 g via INTRAVENOUS
  Filled 2013-10-28 (×2): qty 1.5

## 2013-10-28 MED ORDER — OXYCODONE HCL 5 MG PO TABS: 5.0000 mg | ORAL_TABLET | Freq: Once | ORAL | Status: DC | PRN

## 2013-10-28 MED ORDER — LIDOCAINE HCL (CARDIAC) 20 MG/ML IV SOLN
INTRAVENOUS | Status: DC | PRN
  Administered 2013-10-28: 60 mg via INTRAVENOUS

## 2013-10-28 MED ORDER — POTASSIUM CHLORIDE 10 MEQ/50ML IV SOLN: 10.0000 meq | Freq: Every day | INTRAVENOUS | Status: DC | PRN

## 2013-10-28 MED ORDER — PANTOPRAZOLE SODIUM 40 MG PO TBEC
40.0000 mg | DELAYED_RELEASE_TABLET | Freq: Every day | ORAL | Status: DC
  Administered 2013-10-28 – 2013-11-02 (×6): 40 mg via ORAL
  Filled 2013-10-28 (×5): qty 1

## 2013-10-28 MED ORDER — ACETAMINOPHEN 500 MG PO TABS
1000.0000 mg | ORAL_TABLET | Freq: Four times a day (QID) | ORAL | Status: AC
  Administered 2013-10-29 – 2013-11-02 (×19): 1000 mg via ORAL
  Filled 2013-10-28 (×24): qty 2

## 2013-10-28 MED ORDER — ONDANSETRON HCL 4 MG/2ML IJ SOLN
INTRAMUSCULAR | Status: DC | PRN
  Administered 2013-10-28: 4 mg via INTRAVENOUS

## 2013-10-28 MED ORDER — ACETAMINOPHEN 160 MG/5ML PO SOLN
1000.0000 mg | Freq: Four times a day (QID) | ORAL | Status: AC
  Filled 2013-10-28: qty 40

## 2013-10-28 MED ORDER — ONDANSETRON HCL 4 MG/2ML IJ SOLN: 4.0000 mg | Freq: Four times a day (QID) | INTRAMUSCULAR | Status: DC | PRN

## 2013-10-28 MED ORDER — MUPIROCIN 2 % EX OINT
1.0000 "application " | TOPICAL_OINTMENT | Freq: Two times a day (BID) | CUTANEOUS | Status: AC
  Administered 2013-10-28 – 2013-11-02 (×10): 1 via NASAL
  Filled 2013-10-28 (×2): qty 22

## 2013-10-28 MED ORDER — CHLORHEXIDINE GLUCONATE CLOTH 2 % EX PADS
6.0000 | MEDICATED_PAD | Freq: Every day | CUTANEOUS | Status: DC
  Administered 2013-10-29 – 2013-11-02 (×4): 6 via TOPICAL

## 2013-10-28 MED ORDER — SENNOSIDES-DOCUSATE SODIUM 8.6-50 MG PO TABS
1.0000 | ORAL_TABLET | Freq: Every day | ORAL | Status: DC
  Administered 2013-10-28 – 2013-11-01 (×6): 1 via ORAL
  Filled 2013-10-28 (×8): qty 1

## 2013-10-28 MED ORDER — FENTANYL CITRATE 0.05 MG/ML IJ SOLN
50.0000 ug | INTRAMUSCULAR | Status: DC | PRN
  Administered 2013-10-28: 100 ug via INTRAVENOUS

## 2013-10-28 MED ORDER — HYDROMORPHONE HCL PF 1 MG/ML IJ SOLN
0.2500 mg | INTRAMUSCULAR | Status: DC | PRN
  Administered 2013-10-28 (×2): 0.5 mg via INTRAVENOUS

## 2013-10-28 MED ORDER — EPHEDRINE SULFATE 50 MG/ML IJ SOLN
INTRAMUSCULAR | Status: AC
  Filled 2013-10-28: qty 1

## 2013-10-28 MED ORDER — ONDANSETRON HCL 4 MG/2ML IJ SOLN: 4.0000 mg | Freq: Once | INTRAMUSCULAR | Status: DC | PRN

## 2013-10-28 MED ORDER — DIPHENHYDRAMINE HCL 50 MG/ML IJ SOLN
12.5000 mg | Freq: Four times a day (QID) | INTRAMUSCULAR | Status: DC | PRN
  Administered 2013-10-31: 12.5 mg via INTRAVENOUS
  Filled 2013-10-28: qty 1

## 2013-10-28 MED ORDER — HYDROMORPHONE HCL PF 1 MG/ML IJ SOLN
INTRAMUSCULAR | Status: AC
  Administered 2013-10-28: 0.5 mg via INTRAVENOUS
  Filled 2013-10-28: qty 1

## 2013-10-28 MED ORDER — NEOSTIGMINE METHYLSULFATE 10 MG/10ML IV SOLN
INTRAVENOUS | Status: DC | PRN
  Administered 2013-10-28: 5 mg via INTRAVENOUS

## 2013-10-28 MED ORDER — PROPOFOL 10 MG/ML IV BOLUS
INTRAVENOUS | Status: DC | PRN
  Administered 2013-10-28 (×2): 150 mg via INTRAVENOUS

## 2013-10-28 MED ORDER — DIPHENHYDRAMINE HCL 12.5 MG/5ML PO ELIX
12.5000 mg | ORAL_SOLUTION | Freq: Four times a day (QID) | ORAL | Status: DC | PRN
  Administered 2013-10-30: 12.5 mg via ORAL
  Filled 2013-10-28: qty 5

## 2013-10-28 MED ORDER — FENTANYL 10 MCG/ML IV SOLN
INTRAVENOUS | Status: DC
  Administered 2013-10-28: 135 ug via INTRAVENOUS
  Administered 2013-10-28: 19:00:00 via INTRAVENOUS
  Administered 2013-10-29: 120 ug via INTRAVENOUS
  Administered 2013-10-29: 147 ug via INTRAVENOUS
  Administered 2013-10-29: 165 ug via INTRAVENOUS
  Administered 2013-10-29: 120 ug via INTRAVENOUS
  Administered 2013-10-29: 81 ug via INTRAVENOUS
  Administered 2013-10-29: 06:00:00 via INTRAVENOUS
  Administered 2013-10-29: 132.4 ug via INTRAVENOUS
  Administered 2013-10-30: 60 ug via INTRAVENOUS
  Administered 2013-10-30 (×2): 90 ug via INTRAVENOUS
  Filled 2013-10-28 (×5): qty 50

## 2013-10-28 MED ORDER — MIDAZOLAM HCL 2 MG/2ML IJ SOLN
INTRAMUSCULAR | Status: AC
  Administered 2013-10-28: 2 mg via INTRAVENOUS
  Filled 2013-10-28: qty 2

## 2013-10-28 SURGICAL SUPPLY — 83 items
APPLICATOR TIP COSEAL (VASCULAR PRODUCTS) IMPLANT
APPLICATOR TIP EXT COSEAL (VASCULAR PRODUCTS) IMPLANT
BLADE SURG 11 STRL SS (BLADE) IMPLANT
BRUSH CYTOL CELLEBRITY 1.5X140 (MISCELLANEOUS) IMPLANT
CANISTER SUCTION 2500CC (MISCELLANEOUS) ×3 IMPLANT
CATH KIT ON Q 5IN SLV (PAIN MANAGEMENT) IMPLANT
CATH THORACIC 28FR (CATHETERS) ×3 IMPLANT
CATH THORACIC 36FR (CATHETERS) IMPLANT
CATH THORACIC 36FR RT ANG (CATHETERS) IMPLANT
CLIP TI MEDIUM 6 (CLIP) ×3 IMPLANT
CONN ST 1/4X3/8  BEN (MISCELLANEOUS) ×1
CONN ST 1/4X3/8 BEN (MISCELLANEOUS) ×2 IMPLANT
CONN Y 3/8X3/8X3/8  BEN (MISCELLANEOUS) ×1
CONN Y 3/8X3/8X3/8 BEN (MISCELLANEOUS) ×2 IMPLANT
CONT SPEC 4OZ CLIKSEAL STRL BL (MISCELLANEOUS) ×21 IMPLANT
COVER TABLE BACK 60X90 (DRAPES) ×3 IMPLANT
DERMABOND ADVANCED (GAUZE/BANDAGES/DRESSINGS)
DERMABOND ADVANCED .7 DNX12 (GAUZE/BANDAGES/DRESSINGS) IMPLANT
DRAIN CHANNEL 28F RND 3/8 FF (WOUND CARE) ×3 IMPLANT
DRAIN CHANNEL 32F RND 10.7 FF (WOUND CARE) IMPLANT
DRAPE LAPAROSCOPIC ABDOMINAL (DRAPES) ×3 IMPLANT
DRAPE WARM FLUID 44X44 (DRAPE) ×3 IMPLANT
DRILL BIT 7/64X5 (BIT) IMPLANT
DRSG COVADERM 4X8 (GAUZE/BANDAGES/DRESSINGS) ×3 IMPLANT
ELECT BLADE 4.0 EZ CLEAN MEGAD (MISCELLANEOUS) ×3
ELECT REM PT RETURN 9FT ADLT (ELECTROSURGICAL) ×3
ELECTRODE BLDE 4.0 EZ CLN MEGD (MISCELLANEOUS) ×2 IMPLANT
ELECTRODE REM PT RTRN 9FT ADLT (ELECTROSURGICAL) ×2 IMPLANT
FORCEPS BIOP RJ4 1.8 (CUTTING FORCEPS) IMPLANT
GLOVE BIO SURGEON STRL SZ 6.5 (GLOVE) ×6 IMPLANT
GOWN STRL REUS W/ TWL LRG LVL3 (GOWN DISPOSABLE) ×8 IMPLANT
GOWN STRL REUS W/TWL LRG LVL3 (GOWN DISPOSABLE) ×4
HANDLE STAPLE ENDO GIA SHORT (STAPLE) ×1
KIT BASIN OR (CUSTOM PROCEDURE TRAY) ×3 IMPLANT
KIT ROOM TURNOVER OR (KITS) ×3 IMPLANT
KIT SUCTION CATH 14FR (SUCTIONS) ×3 IMPLANT
MARKER SKIN DUAL TIP RULER LAB (MISCELLANEOUS) ×3 IMPLANT
NEEDLE BIOPSY TRANSBRONCH 21G (NEEDLE) IMPLANT
NS IRRIG 1000ML POUR BTL (IV SOLUTION) ×6 IMPLANT
OIL SILICONE PENTAX (PARTS (SERVICE/REPAIRS)) ×3 IMPLANT
PACK CHEST (CUSTOM PROCEDURE TRAY) ×3 IMPLANT
PAD ARMBOARD 7.5X6 YLW CONV (MISCELLANEOUS) ×9 IMPLANT
PASSER SUT SWANSON 36MM LOOP (INSTRUMENTS) ×3 IMPLANT
RELOAD EGIA 45 MED/THCK PURPLE (STAPLE) ×3 IMPLANT
RELOAD TRI 45 ART MED THCK PUR (STAPLE) ×9 IMPLANT
SCISSORS LAP 5X35 DISP (ENDOMECHANICALS) IMPLANT
SEALANT PROGEL (MISCELLANEOUS) IMPLANT
SEALANT SURG COSEAL 4ML (VASCULAR PRODUCTS) IMPLANT
SEALANT SURG COSEAL 8ML (VASCULAR PRODUCTS) IMPLANT
SOLUTION ANTI FOG 6CC (MISCELLANEOUS) ×3 IMPLANT
SPONGE GAUZE 4X4 12PLY (GAUZE/BANDAGES/DRESSINGS) ×3 IMPLANT
STAPLER ENDO GIA 12MM SHORT (STAPLE) ×2 IMPLANT
SUT PROLENE 3 0 SH DA (SUTURE) IMPLANT
SUT PROLENE 4 0 RB 1 (SUTURE)
SUT PROLENE 4-0 RB1 .5 CRCL 36 (SUTURE) IMPLANT
SUT SILK  1 MH (SUTURE) ×2
SUT SILK 1 MH (SUTURE) ×4 IMPLANT
SUT SILK 2 0 SH (SUTURE) IMPLANT
SUT SILK 2 0SH CR/8 30 (SUTURE) IMPLANT
SUT SILK 3 0SH CR/8 30 (SUTURE) IMPLANT
SUT STEEL 1 (SUTURE) IMPLANT
SUT VIC AB 1 CTX 18 (SUTURE) ×3 IMPLANT
SUT VIC AB 1 CTX 36 (SUTURE)
SUT VIC AB 1 CTX36XBRD ANBCTR (SUTURE) IMPLANT
SUT VIC AB 2-0 CT1 36 (SUTURE) ×3 IMPLANT
SUT VIC AB 2-0 CTX 36 (SUTURE) IMPLANT
SUT VIC AB 2-0 UR6 27 (SUTURE) IMPLANT
SUT VIC AB 3-0 SH 8-18 (SUTURE) IMPLANT
SUT VIC AB 3-0 X1 27 (SUTURE) ×3 IMPLANT
SUT VICRYL 2 TP 1 (SUTURE) ×3 IMPLANT
SWAB COLLECTION DEVICE MRSA (MISCELLANEOUS) IMPLANT
SYR 20ML ECCENTRIC (SYRINGE) ×3 IMPLANT
SYSTEM SAHARA CHEST DRAIN ATS (WOUND CARE) ×3 IMPLANT
TAPE UMBILICAL COTTON 1/8X30 (MISCELLANEOUS) ×3 IMPLANT
TIP APPLICATOR SPRAY EXTEND 16 (VASCULAR PRODUCTS) IMPLANT
TOWEL OR 17X24 6PK STRL BLUE (TOWEL DISPOSABLE) ×6 IMPLANT
TOWEL OR 17X26 10 PK STRL BLUE (TOWEL DISPOSABLE) ×6 IMPLANT
TRAP SPECIMEN MUCOUS 40CC (MISCELLANEOUS) ×3 IMPLANT
TRAY FOLEY CATH 16FRSI W/METER (SET/KITS/TRAYS/PACK) ×3 IMPLANT
TUBE ANAEROBIC SPECIMEN COL (MISCELLANEOUS) IMPLANT
TUBE CONNECTING 12X1/4 (SUCTIONS) ×6 IMPLANT
TUNNELER SHEATH ON-Q 11GX8 DSP (PAIN MANAGEMENT) IMPLANT
WATER STERILE IRR 1000ML POUR (IV SOLUTION) ×6 IMPLANT

## 2013-10-28 NOTE — Progress Notes (Signed)
Patient ID: Lauren Campos, female   DOB: [birth year], 58 y.o.   MRN: [id number] EVENING ROUNDS NOTE :     [address]       [place] [address]             [phone]                 Day of Surgery Procedure(s) (LRB): VIDEO BRONCHOSCOPY (N/A) VIDEO ASSISTED THORACOSCOPY (VATS) lung left,  mini left thorocotomy, wedge resection left upper lobe nodule, lymph node dissection. (Left)  Total Length of Stay:  LOS: 0 days  BP 162/85  Pulse 89  Temp(Src) 97.8 F (36.6 C) (Oral)  Resp 20  Ht 5\' 1"  (1.549 m)  Wt 228 lb (103.42 kg)  BMI 43.10 kg/m2  SpO2 97%  .Intake/Output     06/07 0701 - 06/08 0700 06/08 0701 - 06/09 0700   I.V. (mL/kg)  2500 (24.2)   Total Intake(mL/kg)  2500 (24.2)   Urine (mL/kg/hr)  400 (0.3)   Chest Tube  30 (0)   Total Output   430   Net   +2070          . dextrose 5 % and 0.9% NaCl       Lab Results  Component Value Date   WBC 7.5 10/24/2013   HGB 12.5 10/24/2013   HCT 37.5 10/24/2013   PLT 290 10/24/2013   GLUCOSE 130* 10/24/2013   CHOL 214* 06/07/2011   TRIG 63.0 06/07/2011   HDL 63.70 06/07/2011   LDLDIRECT 130.4 06/07/2011   LDLCALC  Value: 102        Total Cholesterol/HDL:CHD Risk Coronary Heart Disease Risk Table                     Men   Women  1/2 Average Risk   3.4   3.3  Average Risk       5.0   4.4  2 X Average Risk   9.6   7.1  3 X Average Risk  23.4   11.0        Use the calculated Patient Ratio above and the CHD Risk Table to determine the patient's CHD Risk.        ATP III CLASSIFICATION (LDL):  <100     mg/dL   Optimal  100-129  mg/dL   Near or Above                    Optimal  130-159  mg/dL   Borderline  160-189  mg/dL   High  >190     mg/dL   Very High* 05/13/2009   ALT 24 10/24/2013   AST 28 10/24/2013   NA 140 10/24/2013   K 5.0 10/24/2013   CL 107 10/24/2013   CREATININE 0.61 10/24/2013   BUN 13 10/24/2013   CO2 19 10/24/2013   TSH 1.18 06/07/2011   INR 0.94 10/24/2013   HGBA1C  Value: 5.8 (NOTE) The ADA recommends the following  therapeutic goal for glycemic control related to Hgb A1c measurement: Goal of therapy: <6.5 Hgb A1c  Reference: American Diabetes Association: Clinical Practice Recommendations 2010, Diabetes Care, 2010, 33: (Suppl  1). 05/12/2009   Stable in ICU, off vent neuro intact No air leak  [name] MD  Beeper [phone] Office [phone] 10/28/2013 6:52 PM

## 2013-10-28 NOTE — Brief Op Note (Addendum)
      [address]       [place], [address]             [phone]      10/28/2013  1:46 PM  PATIENT:  Lauren Campos  58 y.o. female  PRE-OPERATIVE DIAGNOSIS:  Left lung mass  POST-OPERATIVE DIAGNOSIS:  Left lung mass/ atypical cells  on frozen negative margin  PROCEDURE:  VIDEO BRONCHOSCOPY,  LEFT VIDEO ASSISTED THORACOSCOPY (VATS), LEFT MIN THORACOTOMY, WEDGE RESECTION OF LUL MASS, LYMPH NODE DISSECTION  SURGEON:  Surgeon(s) and Role:    * [name], MD - Primary  PHYSICIAN ASSISTANT: Lars Pinks PA-C  ANESTHESIA:   general  EBL:  Total I/O In: 1800 [I.V.:1800] Out: -   BLOOD ADMINISTERED:none  DRAINS: One 28 French chest tube and one Blake Drain placed in the left pleural space   SPECIMEN:  Source of Specimen:  Wedge of LUL mass, multiple lymph nodes  DISPOSITION OF SPECIMEN:  PATHOLOGY  COUNTS CORRECT:  YES  DICTATION: .Dragon Dictation  PLAN OF CARE: Admit to inpatient   PATIENT DISPOSITION:  PACU - hemodynamically stable.   Delay start of Pharmacological VTE agent (>24hrs) due to surgical blood loss or risk of bleeding: yes

## 2013-10-28 NOTE — Anesthesia Procedure Notes (Addendum)
Procedure Name: Intubation Date/Time: 10/28/2013 10:34 AM Performed by: [name] Pre-anesthesia Checklist: Patient identified, Emergency Drugs available, Suction available, Patient being monitored and Timeout performed Patient Re-evaluated:Patient Re-evaluated prior to inductionOxygen Delivery Method: Circle system utilized Preoxygenation: Pre-oxygenation with 100% oxygen Intubation Type: IV induction, Rapid sequence and Cricoid Pressure applied Laryngoscope Size: Mac and 4 Grade View: Grade I Tube type: Oral Endobronchial tube: Double lumen EBT and EBT position confirmed by fiberoptic bronchoscope and 37 Fr Number of attempts: 2 Airway Equipment and Method: Stylet and Fiberoptic brochoscope Placement Confirmation: ETT inserted through vocal cords under direct vision,  breath sounds checked- equal and bilateral and positive ETCO2 Secured at: 27 cm Tube secured with: Tape Dental Injury: Teeth and Oropharynx as per pre-operative assessment    Procedure Name: Intubation Date/Time: 10/28/2013 1:52 PM Performed by: [name] Pre-anesthesia Checklist: Patient identified, Emergency Drugs available, Suction available, Patient being monitored and Timeout performed Patient Re-evaluated:Patient Re-evaluated prior to inductionOxygen Delivery Method: Circle system utilized Preoxygenation: Pre-oxygenation with 100% oxygen Intubation Type: IV induction Laryngoscope Size: Mac and 4 Grade View: Grade I Tube type: Oral Tube size: 8.0 mm Number of attempts: 1 Airway Equipment and Method: Stylet Placement Confirmation: ETT inserted through vocal cords under direct vision,  positive ETCO2 and breath sounds checked- equal and bilateral Secured at: 22 cm Tube secured with: Tape Dental Injury: Teeth and Oropharynx as per pre-operative assessment

## 2013-10-28 NOTE — Transfer of Care (Signed)
Immediate Anesthesia Transfer of Care Note  Patient: Lauren Campos  Procedure(s) Performed: Procedure(s): VIDEO BRONCHOSCOPY (N/A) VIDEO ASSISTED THORACOSCOPY (VATS) lung left,  mini left thorocotomy, wedge resection left upper lobe nodule, lymph node dissection. (Left)  Patient Location: PACU  Anesthesia Type:General  Level of Consciousness: sedated  Airway & Oxygen Therapy: Patient placed on Ventilator (see vital sign flow sheet for setting)  Post-op Assessment: Report given to PACU RN and Post -op Vital signs reviewed and stable  Post vital signs: Reviewed and stable  Complications: No apparent anesthesia complications

## 2013-10-28 NOTE — Anesthesia Postprocedure Evaluation (Signed)
  Anesthesia Post-op Note  Patient: Lauren Campos  Procedure(s) Performed: Procedure(s): VIDEO BRONCHOSCOPY (N/A) VIDEO ASSISTED THORACOSCOPY (VATS) lung left,  mini left thorocotomy, wedge resection left upper lobe nodule, lymph node dissection. (Left)  Patient Location: PACU  Anesthesia Type:General  Level of Consciousness: awake, alert  and oriented  Airway and Oxygen Therapy: Patient Spontanous Breathing and Patient connected to nasal cannula oxygen  Post-op Pain: moderate  Post-op Assessment: Post-op Vital signs reviewed, Patient's Cardiovascular Status Stable, Respiratory Function Stable, Patent Airway and Pain level controlled  Post-op Vital Signs: stable  Last Vitals:  Filed Vitals:   10/28/13 1715  BP: 159/78  Pulse: 85  Temp: 36.6 C  Resp: 14    Complications: No apparent anesthesia complications

## 2013-10-28 NOTE — H&P (Signed)
[address]       [name] [address]             [phone]                         Lauren Campos [place] Medical Record #[id number] Date of Birth: 25-Feb-1956  Referring:Dr Ramasawami  Primary Care: Nyoka Cowden, MD  Chief Complaint:    Left lung mass   History of Present Illness:    Lauren Campos 58 y.o. female is seen in the office for as a second opinion for resection of left upper lobe lung nodule after a cardiac clearence. The patient noted neck swelling in February of 2014. A CT scan of the neck at that time revealed a 5.9 mm left upper lobe lung lesion(06/26/2012). A repeat scan showed enlargement of the left upper lobe lung nodule 8.5 mm on scan noted 04/04/2013. Repeat super D. CT scan of the chest March 2015 revealed the lesion had enlarged to 11 mm. The patient was to undergo surgical resection of the left upper lobe lung lesion for treatment and for diagnosis 6 weeks  ago, with induction of anesthesia she had significant amount of vomiting with aspiration, bronchoscopy only was performed and the patient recovered from the event. She has a lap band in place and since last seen has had the fluid removed from band. CT scan showed dilated esophagus with air-fluid levels.    Current Activity/ Functional Status:  Patient is independent with mobility/ambulation, transfers, ADL's, IADL's.   Zubrod Score: At the time of surgery this patient's most appropriate activity status/level should be described as: []     0    Normal activity, no symptoms [x]     1    Restricted in physical strenuous activity but ambulatory, able to do out light work []     2    Ambulatory and capable of self care, unable to do work activities, up and about               >50 % of waking hours                              []     3    Only limited self care, in bed greater than 50% of waking hours []     4    Completely disabled, no self care, confined to bed or  chair []     5    Moribund   Past Medical History  Diagnosis Date  .   07/20/2006  . BACK PAIN, LOW 07/20/2006  . BREAST LUMP 09/05/2007    "benign"  . CONSTIPATION, CHRONIC 08/10/2009  . DIVERTICULOSIS, COLON 08/10/2009  . Hemoptysis 08/27/2008  . HYPERLIPIDEMIA 05/08/2007  . HYPERLIPIDEMIA     "not anymore" (08/26/2013)  . INSOMNIA NOS 07/20/2006  . 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 OBESITY, NOS 07/20/2006  . Pulmonary nodule   . Heart murmur   . GERD (gastroesophageal reflux disease)   . Pneumonia 2014    "once" (08/26/2013)  . MIGRAINE, COMMON 05/08/2007    "haven't had one in ~ 6 months" (08/26/2013)  . Lung cancer   . Chest pain   . Dyspnea on exertion   . Tobacco abuse   . COPD (chronic obstructive pulmonary disease)   . Leaky heart valve   . Arthritis     Past Surgical History  Procedure Laterality Date  . Laparoscopic gastric banding  ~ 2006  . Colonoscopy    . Bronchoscopy  08/26/2013  . Abdominal hysterectomy  1990's  . Tubal ligation  1980's  . Shoulder arthroscopy w/ rotator cuff repair Left 2014  . Wrist fracture surgery Left ~ 2010  . Video bronchoscopy N/A 08/26/2013    Procedure: VIDEO BRONCHOSCOPY;  Surgeon: Melrose Nakayama, MD;  Location: [facility] OR;  Service: Thoracic;  Laterality: N/A;    Family History  Problem Relation Age of Onset  . Cancer Mother   . Diabetes Mother   . Heart disease Mother   . Cancer Father   . Diabetes Sister   . Heart attack Brother   . Heart disease Brother     History   Social History  . Marital Status: Divorced    Spouse Name: N/A    Number of Children: N/A  . Years of Education: N/A   Occupational History  . Not on file.   Social History Main Topics  . Smoking status: Current Every Day Smoker -- 0.25 packs/day for 44 years    Types: Cigarettes  . Smokeless tobacco: Never Used  . Alcohol Use: No     Comment: occasional wine  . Drug Use: No  . Sexual Activity: Not Currently   Other Topics Concern  . Not on file   Social  History Narrative  . No narrative on file    History  Smoking status  . Current Every Day Smoker -- 0.25 packs/day for 44 years  . Types: Cigarettes  Smokeless tobacco  . Never Used    History  Alcohol Use No    Comment: occasional wine     No Known Allergies  Current Facility-Administered Medications  Medication Dose Route Frequency Provider Last Rate Last Dose  . cefUROXime (ZINACEF) 1.5 g in dextrose 5 % 50 mL IVPB  1.5 g Intravenous 60 min Pre-Op [name], MD      . fentaNYL (SUBLIMAZE) injection 50-100 mcg  50-100 mcg Intravenous PRN [name], MD   100 mcg at 10/28/13 5427  . lactated ringers infusion   Intravenous Continuous [name], MD 50 mL/hr at 10/28/13 [id number]    . midazolam (VERSED) injection 1-2 mg  1-2 mg Intravenous PRN [name], MD   2 mg at 10/28/13 7628   Facility-Administered Medications Ordered in Other Encounters  Medication Dose Route Frequency Provider Last Rate Last Dose  . lactated ringers infusion    Continuous PRN [name], CRNA         Review of Systems:     Cardiac Review of Systems: Y or N  Chest Pain [  n  ]  Resting SOB [n   ] Exertional SOB  [  y]  Orthopnea Florencio.Farrier  ]   Pedal Edema [ y  ]    Palpitations [ n ] Syncope  [n]   Presyncope [ n  ]  General Review of Systems: [Y] = yes [  ]=no Constitional: recent weight change [n  ];  Wt loss over the last 3 months [   ] anorexia [  ]; fatigue [ y ]; nausea [  ]; night sweats [  ]; fever [  ]; or chills [  ];          Dental: poor dentition[ n ]; Last Dentist visit:   Eye : blurred vision [  ]; diplopia [   ]; vision changes [  ];  Amaurosis fugax[  ]; Resp: cough [ y ];  wheezing[ y ];  hemoptysis[ years ago ]; shortness of breath[  y]; paroxysmal nocturnal dyspnea[ y ]; dyspnea on exertion[  ]; or orthopnea[  ];  GI:  gallstones[  ], vomiting[  ];  dysphagia[  ]; melena[  ];  hematochezia [  ]; heartburn[  ];   Hx of  Colonoscopy[  ]; GU: kidney stones [  ];  hematuria[  ];   dysuria [  ];  nocturia[  ];  history of     obstruction [  ]; urinary frequency [  ]             Skin: rash, swelling[  ];, hair loss[  ];  peripheral edema[  ];  or itching[  ]; Musculosketetal: myalgias[  ];  joint swelling[y  ];  joint erythema[  ];  joint pain[  ];  back pain[y  ];  Heme/Lymph: bruising[  ];  bleeding[  ];  anemia[  ];  Neuro: TIA[n  ];  headaches[ n ];  stroke[  ];  vertigo[  ];  seizures[  ];   paresthesias[  ];  difficulty walking[ n ];  Psych:depression[  ]; anxiety[  ];  Endocrine: diabetes[  ];  thyroid dysfunction[  ];  Immunizations: Flu up to date [ y ]; Pneumococcal up to date Blue.Reese  ];  Other:  Physical Exam: BP 152/88  Pulse 77  Temp(Src) 97.6 F (36.4 C) (Oral)  Resp 15  Wt 228 lb (103.42 kg)  SpO2 100%  PHYSICAL EXAMINATION:  General appearance: alert, cooperative, appears older than stated age and moderately obese Neurologic: intact Heart: 2/6 early systolic murmur Lungs: Patient has distant breath sounds bilaterally, she's not actively wheezing  Abdomen: Moderately obese abdomen without masses Extremities: She has no evidence of DVT or pedal edema Do not appreciate any cervical or supraclavicular adenopathy    Diagnostic Studies & Laboratory data:     Recent Radiology Findings:  Chest 2 View  10/28/2013   CLINICAL DATA:  Left lung mass.  Pre-op respiratory exam for VATS.  EXAM: CHEST  2 VIEW  COMPARISON:  08/27/2013 and chest CT on 07/29/2013  FINDINGS: Small nodular density again seen in the left upper lobe. Lung fields are otherwise clear. No evidence of pleural effusion. Heart size and mediastinal contours are normal.  IMPRESSION: Small left upper lobe pulmonary nodule, as better demonstrated on recent CT. Otherwise negative.   Electronically Signed   By: Earle Gell M.D.   On: 10/28/2013 08:02    Dg Chest 2 View  08/27/2013   CLINICAL DATA:  Cough, possible aspiration  EXAM: CHEST  2 VIEW  COMPARISON:  08/26/2013  FINDINGS:  Right subclavian line tip projecting over SVC confluence.  Enlargement of cardiac silhouette with pulmonary vascular congestion.  Tortuous aorta.  Improved perihilar edema.  No gross pleural effusion or pneumothorax.  IMPRESSION: Improved pulmonary edema.   Electronically Signed   By: Lavonia Dana M.D.   On: 08/27/2013 08:08     RADIOLOGY REPORT*  Clinical Data: Improvement in neck pain. Post flexible laryngoscopy  without abnormality noted. Reevaluate possible piriform sinus  mass.  CT NECK WITH CONTRAST  Technique: Multidetector CT imaging of the neck was performed with  intravenous contrast.  Contrast: 39mL OMNIPAQUE IOHEXOL 300 MG/ML SOLN  Comparison: 06/25/2012.  Findings: Left piriform sinus is now aerated and relatively  symmetric when compared to the right without mass identified.  Within the visualized upper lung  zones, emphysematous changes are  noted with small pleural effusions greater on the left.  Additionally, scattered nodules are noted measuring 5 mm right  upper lobe, 6 mm left upper lobe and 4 mm superior segment left  lower lobe. These are not completely assessed on present  examination.  If the patient is at high risk for bronchogenic carcinoma, follow-  up chest CT at 6-12 months is recommended. If the patient is at  low risk for bronchogenic carcinoma, follow-up chest CT at 12  months is recommended. This recommendation follows the consensus  statement: Guidelines for Management of Small Pulmonary Nodules  Detected on CT Scans: A Statement from the [place] as  published in Radiology 2005; 237:395-400.  Retropharyngeal fluid collection remains with maximal thickness at  the C3 level with AP dimension of 3.8 mm.  Haziness of fat planes along the left supraclavicular region and  left chest wall with left supraclavicular adenopathy. Scattered  lymph nodes throughout the neck largest in the left level II region  measuring 1.1 x 1.5 x 1.7 cm.  Etiology of  these findings is indeterminate. Considerations include  infectious process, inflammatory process, angioedema or result of  vascular engorgement.  Visualized intracranial structures and orbital structures  unremarkable.  No primary parotid, submandibular, posterior-superior  nasopharyngeal, palatine tonsil, tongue base, glottic or thyroid  lesion noted.  Atherosclerotic type changes with calcification aortic arch and  carotid bifurcation without hemodynamically significant stenosis.  No calcification of the longus colonic. Scattered cervical  spondylotic changes without bony destructive lesion.  IMPRESSION:  Left piriform sinus is now aerated and relatively symmetric when  compared to the right without mass identified.  Retropharyngeal fluid collection remains with maximal thickness at  the C3 level with AP dimension of 3.8 mm.  Haziness of fat planes along the left supraclavicular region and  left chest wall with left supraclavicular adenopathy. Scattered  lymph nodes throughout the neck largest in the left level II region  measuring 1.1 x 1.5 x 1.7 cm.  Etiology of these findings is indeterminate. Considerations include  infectious process, inflammatory process, angioedema or result of  vascular engorgement. Result of neoplasm felt to be a less likely  consideration.  Within the visualized upper lung zones, emphysematous changes are  noted with small pleural effusions greater on the left.  Additionally, scattered nodules are noted measuring 5 mm right  upper lobe, 6 mm left upper lobe and 4 mm superior segment left  lower lobe. These are not completely assessed on present  examination.  If the patient is at high risk for bronchogenic carcinoma, follow-  up chest CT at 6-12 months is recommended. If the patient is at  low risk for bronchogenic carcinoma, follow-up chest CT at 12  months is recommended. This recommendation follows the consensus  statement: Guidelines for Management  of Small Pulmonary Nodules  Detected on CT Scans: A Statement from the [place] as  published in Radiology 2005; 237:395-400.  Original Report Authenticated By: Genia Del, M.D.  [id number] DATA: Nonproductive cough. Night sweats. Recent treatment  for pneumonia.  EXAM:  CT CHEST WITHOUT CONTRAST  TECHNIQUE:  Multidetector CT imaging of the chest was performed following the  standard protocol without IV contrast.  COMPARISON: PA and lateral chest earlier this same day, 02/27/2013  hand 05/12/2009. CT chest 12/24/2003.  FINDINGS:  There is no axillary, hilar or mediastinal lymphadenopathy. Pleural  or pericardial effusion. Heart size is normal. The esophagus is  mildly dilated with an air-fluid level.  Lap band is noted. Lungs  demonstrate fairly extensive centrilobular emphysematous disease.  Small focus of micro nodularity is identified in the right lung apex  most consistent with prior inflammatory change. No consolidative  process is present. There is a left upper lobe nodule measuring 0.9  cm in diameter on image 14. A 0.4 cm left upper lobe nodule is seen  image 19. The lungs are otherwise unremarkable. Incidentally imaged  upper abdomen demonstrates no focal abnormality. No focal bony  abnormality is identified.  IMPRESSION:  Negative for pneumonia.  0.9 cm left upper lobe nodule could be secondary to bronchogenic  carcinoma. PET CT scan is recommended for further evaluation.  Mildly dilated esophagus with an air-fluid level may be secondary to  the patient's lap band.   CLINICAL DATA: Followup of pulmonary nodule. History of hemoptysis.  Gastric banding. Obesity.  EXAM:  CT CHEST WITHOUT CONTRAST  TECHNIQUE:  Multidetector CT imaging of the chest was performed using thin slice  collimation for electromagnetic bronchoscopy planning purposes,  without intravenous contrast.  COMPARISON: DG RIBS UNILATERAL W/CHEST*R* dated 06/01/2013; NM PET  IMAGE INITIAL (PI)  SKULL BASE TO THIGH dated 04/30/2013; CT CHEST W/O  CM dated 04/04/2013  FINDINGS:  Lungs/Pleura: Moderate centrilobular emphysema. Similar subpleural 3  mm right apical lung nodule on image 7.  Posterior left upper lobe pulmonary nodule measures 11 x 11 mm on  image 13 and has enlarged from the prior exam, where it measured 9 x  9 mm (when remeasured).  3 mm subpleural nodule in the left upper lobe on image 18 is less  conspicuous in on the prior exam. There is an adjacent more cephalad  3 mm nodule which is likely new but also favored to represent a  subpleural lymph node.  3 mm left lower lobe nodule on image 29 demonstrates central  cavitation and is not readily apparent on the prior.  Subpleural 3 mm superior segment left lower lobe nodule on image 13  is likely new. No pleural fluid.  Heart/Mediastinum: Normal heart size, without pericardial effusion.  No mediastinal or definite hilar adenopathy, given limitations of  unenhanced CT. Small hiatal hernia. Dilated esophagus with fluid  level within.  Upper Abdomen: Suspicion of a hypo attenuating left liver lobe  lesion at 1.6 cm on image 50/series 2. Likely similar on image 115  of the prior PET. Indeterminate. Lap band. Normal imaged adrenal  glands. A right-sided fat containing Bochdalek's hernia.  Bones/Musculoskeletal: Multiple healing anterior right rib  fractures, new since 04/04/2013.  IMPRESSION:  1. Mild but definite enlargement of a left upper lobe pulmonary  nodule since 04/04/2013. This is highly suspicious for primary  bronchogenic carcinoma.  2. Centrilobular emphysema. Scattered smaller nodules. Some are  larger and some are smaller. Primarily felt to represent benign  entities such as subpleural lymph nodes. A tiny but cavitary left  lower lobe nodule warrant special followup attention.  3. Suspicion of a low-density left hepatic lobe lesion. This was  present on the 04/30/2013 PET and is not definitely  hypermetabolic.  Recommend either attention on followup or definitive  characterization with dedicated abdominal MRI.  4. Lap band in place. Esophageal dilatation and fluid level could be  secondary or represent dysmotility.  5. Interval nonacute anterior right rib fractures.  Electronically Signed  By: Abigail Miyamoto M.D.  On: 07/29/2013 15:18  PET: CLINICAL DATA: The initial treatment strategy for pulmonary nodule  on chest CT. Nonproductive cough with night sweats. No given history  of malignancy.  EXAM:  NUCLEAR MEDICINE PET SKULL BASE TO THIGH  FASTING BLOOD GLUCOSE: Value: 82mg /dl  TECHNIQUE:  18.5 mCi F-18 FDG was injected intravenously. CT data was obtained  and used for attenuation correction and anatomic localization only.  (This was not acquired as a diagnostic CT examination.) Additional  exam technical data entered on technologist worksheet.  COMPARISON: Chest CT 04/04/2013.  FINDINGS:  NECK  No hypermetabolic lymph nodes in the neck.  CHEST  There are no hypermetabolic mediastinal, hilar or axillary lymph  nodes. The left upper lobe 9 mm pulmonary nodule does not show  significantly increased metabolic activity. This is seen on image 63  and has an SUV max of 1.8, similar to background soft tissue  activity. No enlarging pulmonary nodules are identified. Scattered  smaller nodules are present bilaterally. In addition moderate  emphysematous changes are present throughout both lungs.  ABDOMEN/PELVIS  There is no abnormal metabolic activity within the liver, spleen,  adrenal glands or pancreas. There are no hypermetabolic abdominal  pelvic lymph nodes. A hiatal hernia and gastric laparoscopic band  are noted.  SKELETON  No focal hypermetabolic activity to suggest skeletal metastasis.  IMPRESSION:  1. The left upper lobe pulmonary nodule and additional smaller  pulmonary nodules do not show significantly increased metabolic  activity. This is a reassuring finding,  although does not completely  exclude low grade neoplasm. Chest CT follow-up in 6 months  recommended.  2. No hypermetabolic nodal activity within the neck, chest, abdomen  or pelvis.  Electronically Signed  By: Camie Patience M.D.  On: 04/30/2013 13:24  Recent Lab Findings: Lab Results  Component Value Date   WBC 7.5 10/24/2013   HGB 12.5 10/24/2013   HCT 37.5 10/24/2013   PLT 290 10/24/2013   GLUCOSE 130* 10/24/2013   CHOL 214* 06/07/2011   TRIG 63.0 06/07/2011   HDL 63.70 06/07/2011   LDLDIRECT 130.4 06/07/2011   LDLCALC  Value: 102        Total Cholesterol/HDL:CHD Risk Coronary Heart Disease Risk Table                     Men   Women  1/2 Average Risk   3.4   3.3  Average Risk       5.0   4.4  2 X Average Risk   9.6   7.1  3 X Average Risk  23.4   11.0        Use the calculated Patient Ratio above and the CHD Risk Table to determine the patient's CHD Risk.        ATP III CLASSIFICATION (LDL):  <100     mg/dL   Optimal  100-129  mg/dL   Near or Above                    Optimal  130-159  mg/dL   Borderline  160-189  mg/dL   High  >190     mg/dL   Very High* 05/13/2009   ALT 24 10/24/2013   AST 28 10/24/2013   NA 140 10/24/2013   K 5.0 10/24/2013   CL 107 10/24/2013   CREATININE 0.61 10/24/2013   BUN 13 10/24/2013   CO2 19 10/24/2013   TSH 1.18 06/07/2011   INR 0.94 10/24/2013   HGBA1C  Value: 5.8 (NOTE) The ADA recommends the following therapeutic goal for glycemic control related to Hgb A1c measurement: Goal of therapy: <6.5 Hgb A1c  Reference: American Diabetes  Association: Clinical Practice Recommendations 2010, Diabetes Care, 2010, 33: (Suppl  1). 05/12/2009   Stress Test: Impression  Exercise Capacity: Lexiscan with no exercise.  BP Response: Normal blood pressure response.  Clinical Symptoms: No significant symptoms noted.  ECG Impression: No significant ST segment change suggestive of ischemia.  Comparison with Prior Nuclear Study: No images to compare  Overall Impression: Normal stress nuclear study.    LV Wall Motion: NL LV Function; NL Wall Motion  Lorretta Harp, MD  10/15/2013 5:13 PM  ECHO:Study Conclusions  - Left ventricle: The cavity size was normal. Wall thickness was normal. Systolic function was normal. The estimated ejection fraction was in the range of 55% to 60%. Wall motion was normal; there were no regional wall motion abnormalities. Doppler parameters are consistent with abnormal left ventricular relaxation (grade 1 diastolic dysfunction). - Aortic valve: There was mild to moderate regurgitation. - Mitral valve: There was mild regurgitation.  -------------------------------------------------------------------  ------------------------------------------------------------------- Left ventricle: The cavity size was normal. Wall thickness was normal. Systolic function was normal. The estimated ejection fraction was in the range of 55% to 60%. Wall motion was normal; there were no regional wall motion abnormalities. Doppler parameters are consistent with abnormal left ventricular relaxation (grade 1 diastolic dysfunction).  ------------------------------------------------------------------- Aortic valve: Mildly thickened leaflets. Cusp separation was normal. Doppler: Transvalvular velocity was within the normal range. There was no stenosis. There was mild to moderate regurgitation. Peak gradient (S): 11 mm Hg.  ------------------------------------------------------------------- Aorta: The aorta was normal, not dilated, and non-diseased.  ------------------------------------------------------------------- Mitral valve: Doppler: There was mild regurgitation.  ------------------------------------------------------------------- Left atrium: LA Volume/BSA= 21.8 ml/m2. The atrium was normal in size.  ------------------------------------------------------------------- Right ventricle: The cavity size was normal. Wall thickness was normal. Systolic function was  normal.  ------------------------------------------------------------------- Pulmonic valve: Poorly visualized. Structurally normal valve. Cusp separation was normal. Doppler: Transvalvular velocity was within the normal range. There was trivial regurgitation.  ------------------------------------------------------------------- Tricuspid valve: Structurally normal valve. Leaflet separation was normal. Doppler: Transvalvular velocity was within the normal range. There was no regurgitation.  ------------------------------------------------------------------- Pulmonary artery: The main pulmonary artery was normal-sized.  ------------------------------------------------------------------- Right atrium: The atrium was normal in size.  ------------------------------------------------------------------- Pericardium: The pericardium was normal in appearance. There was no pericardial effusion.  ------------------------------------------------------------------- Systemic veins: Inferior vena cava: The vessel was normal in size. The respirophasic diameter changes were in the normal range (= 50%), consistent with normal central venous pressure. Diameter: 14.4 mm.  ------------------------------------------------------------------- Post procedure conclusions Ascending Aorta:  - The aorta was normal, not dilated, and non-diseased.  ------------------------------------------------------------------- Prepared and Electronically Authenticated by  Candee Furbish, M.D. 2015-05-26T11:51:33  -------------------------------------------------------------------   PFT's  08/06/2013 FEV1 2.11 112%  DLCO 16   79%   Assessment / Plan:   Patient with moderate emphysema an enlarging left upper lobe lung nodule though not hypermetabolic.  Resection of the left upper lobe lung nodule was recommended to the patient, after intubation she aspirated and the suture was aborted. She returned to see me for a  different surgeon to proceed with surgery. The patient has had  echocardiogram and and stress test by Dr. Gwenlyn Found prior to surgical intervention  Plan to proceed with bronchoscopy left video-assisted thoracoscopy and wedge resection of left lung nodule and if malignant probable lobectomy on June 8 The risks and options of surgical resection have been discussed with the patient in detail and she is agreeable with proceeding. The goals risks and alternatives of the planned surgical procedure Bronchoscopy, Left VATS, lung resection have been discussed  with the patient in detail. The risks of the procedure including death, infection, stroke, myocardial infarction, bleeding, blood transfusion have all been discussed specifically.  I have quoted Bunnie Domino a 2 % of perioperative mortality and a complication rate as high as 20 %. The patient's questions have been answered.JAMARIA AMBORN is willing  to proceed with the planned procedure.  [name] MD   [address] [place] [address] Office [phone]   Beeper [phone]  10/28/2013 10:12 AM

## 2013-10-28 NOTE — Anesthesia Preprocedure Evaluation (Addendum)
Anesthesia Evaluation  Patient identified by MRN, date of birth, ID band Patient awake    Reviewed: Allergy & Precautions, H&P , NPO status , Patient's Chart, lab work & pertinent test results  Airway Mallampati: II TM Distance: >3 FB Neck ROM: Full    Dental  (+) Edentulous Upper, Partial Lower, Dental Advisory Given   Pulmonary shortness of breath and with exertion, Current Smoker,  breath sounds clear to auscultation        Cardiovascular + Valvular Problems/Murmurs Rhythm:Regular Rate:Normal     Neuro/Psych  Headaches, Anxiety    GI/Hepatic GERD-  Controlled,  Endo/Other  Morbid obesity  Renal/GU      Musculoskeletal   Abdominal   Peds  Hematology   Anesthesia Other Findings   Reproductive/Obstetrics                          Anesthesia Physical Anesthesia Plan  ASA: III  Anesthesia Plan: General   Post-op Pain Management:    Induction: Intravenous  Airway Management Planned: Double Lumen EBT  Additional Equipment: CVP and Arterial line  Intra-op Plan:   Post-operative Plan: Extubation in OR  Informed Consent: I have reviewed the patients History and Physical, chart, labs and discussed the procedure including the risks, benefits and alternatives for the proposed anesthesia with the patient or authorized representative who has indicated his/her understanding and acceptance.   Dental advisory given  Plan Discussed with: CRNA, Anesthesiologist and Surgeon  Anesthesia Plan Comments:        Anesthesia Quick Evaluation

## 2013-10-28 NOTE — Progress Notes (Signed)
Report rec'd from Doristine Johns, assuming care of patient

## 2013-10-29 ENCOUNTER — Encounter (HOSPITAL_COMMUNITY): Payer: Self-pay | Admitting: Cardiothoracic Surgery

## 2013-10-29 ENCOUNTER — Inpatient Hospital Stay (HOSPITAL_COMMUNITY): Payer: BC Managed Care – PPO

## 2013-10-29 LAB — BASIC METABOLIC PANEL
BUN: 7 mg/dL (ref 6–23)
CO2: 24 meq/L (ref 19–32)
Calcium: 9.2 mg/dL (ref 8.4–10.5)
Chloride: 100 mEq/L (ref 96–112)
Creatinine, Ser: 0.5 mg/dL (ref 0.50–1.10)
GFR calc Af Amer: >90 mL/min (ref >90)
GFR calc non Af Amer: >90 mL/min (ref >90)
GLUCOSE: 129 mg/dL — AB (ref 70–99)
POTASSIUM: 4.5 meq/L (ref 3.7–5.3)
SODIUM: 137 meq/L (ref 137–147)

## 2013-10-29 LAB — BLOOD GAS, ARTERIAL
ACID-BASE DEFICIT: 1.4 mmol/L (ref 0.0–2.0)
BICARBONATE: 23.8 meq/L (ref 20.0–24.0)
Drawn by: 40415
O2 Content: 2 L/min
O2 Saturation: 89.9 %
PATIENT TEMPERATURE: 97.9
PH ART: 7.334 — AB (ref 7.350–7.450)
TCO2: 25.2 mmol/L (ref 0–100)
pCO2 arterial: 45.6 mmHg — ABNORMAL HIGH (ref 35.0–45.0)
pO2, Arterial: 54.8 mmHg — ABNORMAL LOW (ref 80.0–100.0)

## 2013-10-29 LAB — CBC
HEMATOCRIT: 36.3 % (ref 36.0–46.0)
Hemoglobin: 11.6 g/dL — ABNORMAL LOW (ref 12.0–15.0)
MCH: 30.1 pg (ref 26.0–34.0)
MCHC: 32 g/dL (ref 30.0–36.0)
MCV: 94 fL (ref 78.0–100.0)
Platelets: 245 10*3/uL (ref 150–400)
RBC: 3.86 MIL/uL — ABNORMAL LOW (ref 3.87–5.11)
RDW: 15.2 % (ref 11.5–15.5)
WBC: 11.5 10*3/uL — AB (ref 4.0–10.5)

## 2013-10-29 LAB — GLUCOSE, CAPILLARY
Glucose-Capillary: 122 mg/dL — ABNORMAL HIGH (ref 70–99)
Glucose-Capillary: 111 mg/dL — ABNORMAL HIGH (ref 70–99)
Glucose-Capillary: 89 mg/dL (ref 70–99)

## 2013-10-29 MED ORDER — ENOXAPARIN SODIUM 30 MG/0.3ML ~~LOC~~ SOLN
30.0000 mg | SUBCUTANEOUS | Status: DC
  Administered 2013-10-29 – 2013-11-02 (×5): 30 mg via SUBCUTANEOUS
  Filled 2013-10-29 (×5): qty 0.3

## 2013-10-29 MED ORDER — GUAIFENESIN ER 600 MG PO TB12
600.0000 mg | ORAL_TABLET | Freq: Two times a day (BID) | ORAL | Status: DC | PRN
  Administered 2013-10-29: 600 mg via ORAL
  Filled 2013-10-29 (×2): qty 1

## 2013-10-29 NOTE — Progress Notes (Signed)
UR Completed.  Vergie Living [phone] 10/29/2013

## 2013-10-29 NOTE — Op Note (Signed)
NAMEGILBERT, NARAIN NO.:  [id number]  MEDICAL RECORD NO.:  [id number]  LOCATION:  2S09C                        FACILITY:  [facility]  PHYSICIAN:  Lanelle Bal, MD    DATE OF BIRTH:  08-08-1955  DATE OF PROCEDURE:  10/28/2013 DATE OF DISCHARGE:                              OPERATIVE REPORT   POSTOPERATIVE DIAGNOSIS:  Left upper lobe lung lesion.  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  PROCEDURE PERFORMED:  Bronchoscopy, left video-assisted thoracoscopy, minithoracotomy, wedge resection of left upper lobe, and lymph node dissection.  SURGEON:  Lanelle Bal, MD  FIRST ASSISTANT:  Lars Pinks, PA  BRIEF HISTORY:  The patient is a 58 year old female, who had a left upper lobe nodule approximately just under 1 cm in size, which over several years.  It slowly increased in size.  It was not markedly avid, but there was concern for malignancy.  She had originally been seen by Dr. Roxan Hockey.  An attempt to remove the mass was resulted in anesthetic complications with aspiration on intubation, and the procedure was abandoned.  The patient then saw me for consideration of removal.  Cardiac workup was negative.  In coordination with General Surgery, the patient's lap band was deflated completely to decrease the risk of perioperative aspiration.  The patient has adequate pulmonary PFT, but her overall physical condition is somewhat limited and short of breath with exertion.  Risks and options were discussed with the patient in detail, and she was willing to proceed.  DESCRIPTION OF PROCEDURE:  With central line and arterial line placed, the patient underwent general endotracheal anesthesia by Dr. Glennon Mac without incident.  A double-lumen endotracheal tube was placed. Appropriate time-out was performed, and through the double-lumen endotracheal tube, a fiberoptic bronchoscopy was performed  confirming the position of double-lumen endotracheal tube.  The patient was then turned in lateral decubitus position with the left side up.  The left chest was prepped with Betadine and draped in usual sterile manner.  A second time-out was performed confirming the side.  We initially proceeded with a small port incision approximately at fourth intercostal space, posterior axillary line.  This was met with difficulty as the lungs would not deflate after significant anesthesias cooperation.  The lungs finally was able to be collapsed slightly.  There was no obvious pleural disease evident.  We expanded the initial port incision and did have just placed a small retractor to be able to palpate the lung and locate the nodule which was along the fissure.  Wedge resection of lung was then carried out.  During this procedure, the patient continued to have episodes of desaturation into the 70s requiring constant anesthesia in attendance with Dr. Glennon Mac adjusting the double-lumen endotracheal tube for proper isolation of the lung.  Wedge resection was completed and submitted to Pathology.  Pathology report on multiple sections showed clear margins and atypia and suspicion, but no definite diagnosis of malignancy, at this point, with clear margins and presuming that this was a malignancy and the difficulty with the patient's preoperative pulmonary status and intraoperative issues with lung ventilation.  We decided not to proceed with a lobectomy, but did proceed  with lymph node dissection.  After completion of the procedure, the 2 chest tubes were left in place.  The pericostal sutures were placed through the ribs with a small drill hole and muscle layers were closed with interrupted 0 Vicryl, running 3-0 Vicryl in subcutaneous tissue, and 3-0 subcuticular stitch and Dermabond.  The left lung reinflated nicely, there was no air leak at the completion of the procedure.  The wedge resection  was performed with purple staplers with reinforcing strips.  Sponge and needle count was reported as correct at the completion of the procedure. At the completion of the procedure, the patient was not able to be extubated and was transferred to the recovery room under the care of anesthesia on the ventilator to be weaned and extubated as her muscle relaxing was fully reversed.  Blood loss was minimal.     Lanelle Bal, MD     EG/MEDQ  D:  10/29/2013  T:  10/29/2013  Job:  [id number]

## 2013-10-29 NOTE — Progress Notes (Signed)
Pt. assessed for extubation-able to lift head of bed-sluggish, slowly >'ing LOC, NIF-(-20)cmh20, FVC-(.5)L-on 5(cpap)/10(ps), minimal cuff leak noted, Dr. Linna Caprice made aware@bedside , decided to have pt. remain on wean for X95min., then re-eval, RN aware @bedside , RT to monitor.

## 2013-10-29 NOTE — Progress Notes (Signed)
Nutrition Consult/Brief Note  RD consulted for assistance with diet, pt s/p Lap Band surgery in 2006.  Patient reports she eats "regular" food and does not follow any specific diet guidelines.  Body mass index is 44.05 kg/(m^2). Patient meets criteria for Obesity Class III based on current BMI.   Current diet order is Regular, patient is consuming approximately 100% of meals at this time. Labs and medications reviewed.   No nutrition interventions warranted at this time. If nutrition issues arise, please consult RD.   Arthur Holms, RD, LDN Pager #: [phone] After-Hours Pager #: [phone]

## 2013-10-29 NOTE — Progress Notes (Signed)
Pt arrived to unit VSS, oriented to routine and unit, no family present at this time, Call bell within reach, no c/o pain. Will continue to monitor.

## 2013-10-29 NOTE — Progress Notes (Signed)
Patient ID: Lauren Campos, female   DOB: 04-27-1956, 58 y.o.   MRN: [id number] TCTS DAILY ICU PROGRESS NOTE                   [address]            [place], [address]          [phone]   1 Day Post-Op Procedure(s) (LRB): VIDEO BRONCHOSCOPY (N/A) VIDEO ASSISTED THORACOSCOPY (VATS) lung left,  mini left thorocotomy, wedge resection left upper lobe nodule, lymph node dissection. (Left)  Total Length of Stay:  LOS: 1 day   Subjective: Stable post op, using pca for pain  Objective: Vital signs in last 24 hours: Temp:  [97.4 F (36.3 C)-98 F (36.7 C)] 98 F (36.7 C) (06/09 0736) Pulse Rate:  [68-94] 83 (06/09 0800) Cardiac Rhythm:  [-] Normal sinus rhythm (06/09 0800) Resp:  [12-32] 16 (06/09 0800) BP: (106-175)/(53-90) 112/57 mmHg (06/09 0800) SpO2:  [93 %-100 %] 98 % (06/09 0800) Arterial Line BP: (110-199)/(57-89) 110/67 mmHg (06/09 0800) FiO2 (%):  [30 %-40 %] 30 % (06/08 1645) Weight:  [233 lb 0.4 oz (105.7 kg)] 233 lb 0.4 oz (105.7 kg) (06/09 0500)  Filed Weights   10/28/13 0722 10/29/13 0500  Weight: 228 lb (103.42 kg) 233 lb 0.4 oz (105.7 kg)    Weight change:    Hemodynamic parameters for last 24 hours:    Intake/Output from previous day: 06/08 0701 - 06/09 0700 In: 4303.1 [P.O.:340; I.V.:3913.1; IV Piggyback:50] Out: 1640 [Urine:1350; Chest Tube:290]  Intake/Output this shift: Total I/O In: 150 [I.V.:100; IV Piggyback:50] Out: 200 [Urine:200]  Current Meds: Scheduled Meds: . acetaminophen  1,000 mg Oral 4 times per day   Or  . acetaminophen (TYLENOL) oral liquid 160 mg/5 mL  1,000 mg Oral 4 times per day  . amitriptyline  100 mg Oral QHS  . [START ON 10/31/2013] betamethasone valerate ointment  1 application Topical N23F  . bisacodyl  10 mg Oral Daily  . Chlorhexidine Gluconate Cloth  6 each Topical Q0600  . fentaNYL   Intravenous 6 times per day  . insulin aspart  0-24 Units Subcutaneous 4 times per day  . mupirocin ointment   1 application Nasal BID  . pantoprazole  40 mg Oral Daily  . senna-docusate  1 tablet Oral QHS   Continuous Infusions: . dextrose 5 % and 0.9% NaCl 125 mL/hr at 10/29/13 0727   PRN Meds:.diphenhydrAMINE, diphenhydrAMINE, naloxone, ondansetron (ZOFRAN) IV, ondansetron (ZOFRAN) IV, oxyCODONE, potassium chloride, sodium chloride, traMADol  General appearance: alert and cooperative Neurologic: intact Heart: regular rate and rhythm, S1, S2 normal, no murmur, click, rub or gallop Lungs: diminished breath sounds LUL Abdomen: soft, non-tender; bowel sounds normal; no masses,  no organomegaly Extremities: extremities normal, atraumatic, no cyanosis or edema and Homans sign is negative, no sign of DVT Wound: no air leak  Lab Results: CBC: Recent Labs  10/29/13 0500  WBC 11.5*  HGB 11.6*  HCT 36.3  PLT 245   BMET:  Recent Labs  10/29/13 0500  NA 137  K 4.5  CL 100  CO2 24  GLUCOSE 129*  BUN 7  CREATININE 0.50  CALCIUM 9.2    PT/INR: No results found for this basename: LABPROT, INR,  in the last 72 hours Radiology: Chest 2 View  10/29/2013   CLINICAL DATA:  Minithoracotomy for lung lesion resections  EXAM: PORTABLE CHEST - 1 VIEW  COMPARISON:  October 28, 2013  FINDINGS:  Endotracheal tube is been removed. Central catheter tip is in the superior vena cava. There are 2 chest tubes on the left. There is no appreciable pneumothorax.  There is persistent focal consolidation in the right lower lobe abutting the major fissure. There is patchy atelectatic change in the left mid and upper lobes. Elsewhere the lungs are clear. Heart is mildly enlarged with normal pulmonary vascularity.  IMPRESSION: No pneumothorax. Atelectatic change left mid upper lung zones, stable. Focal consolidation right lower lobe abutting the major fissure, stable. No new opacity. No change in cardiac silhouette.   Electronically Signed   By: Lowella Grip M.D.   On: 10/29/2013 07:38   Dg Chest Port 1 View  10/28/2013    ADDENDUM REPORT: 10/28/2013 15:52  ADDENDUM: Study discussed by telephone with RN Avon Gully on 10/28/2013 at 15:46 hrs.  Also, gastric band is incidentally re- identified on this image.   Electronically Signed   By: Lars Pinks M.D.   On: 10/28/2013 15:52   10/28/2013   CLINICAL DATA:  58 year old female status post VATS and central line. Initial encounter.  EXAM: PORTABLE CHEST - 1 VIEW  COMPARISON:  10/28/2013 at 0652 hr and earlier.  FINDINGS: Seated upright AP portable view at 1457 hrs. Endotracheal tube tip at the carina. Right IJ central line tip at the lower SVC level, just below the carina. Left chest tubes in place.  No pneumothorax. There is confluent opacity along the right major or minor fissure which most resembles atelectasis. There is vague postoperative opacity in the left upper lobe. Normal cardiac size and mediastinal contours.  IMPRESSION: 1. Endotracheal tube tip at the carina, retract 2 cm for more optimal placement. 2. Right IJ central line tip at the lower SVC level. Left chest tubes in place. No pneumothorax. 3. Postoperative opacity in the left lung. Mild atelectasis on the right.  Electronically Signed: By: Lars Pinks M.D. On: 10/28/2013 15:41     Assessment/Plan: S/P Procedure(s) (LRB): VIDEO BRONCHOSCOPY (N/A) VIDEO ASSISTED THORACOSCOPY (VATS) lung left,  mini left thorocotomy, wedge resection left upper lobe nodule, lymph node dissection. (Left) Mobilize d/c tubes/lines Plan for transfer to step-down: see transfer orders Ct to water seal    [name] 10/29/2013 8:43 AM

## 2013-10-30 ENCOUNTER — Inpatient Hospital Stay (HOSPITAL_COMMUNITY): Payer: BC Managed Care – PPO

## 2013-10-30 LAB — CBC
HEMATOCRIT: 36.9 % (ref 36.0–46.0)
HEMOGLOBIN: 11.5 g/dL — AB (ref 12.0–15.0)
MCH: 30.1 pg (ref 26.0–34.0)
MCHC: 31.2 g/dL (ref 30.0–36.0)
MCV: 96.6 fL (ref 78.0–100.0)
Platelets: 234 10*3/uL (ref 150–400)
RBC: 3.82 MIL/uL — ABNORMAL LOW (ref 3.87–5.11)
RDW: 15.6 % — ABNORMAL HIGH (ref 11.5–15.5)
WBC: 7.6 10*3/uL (ref 4.0–10.5)

## 2013-10-30 LAB — COMPREHENSIVE METABOLIC PANEL
ALK PHOS: 85 U/L (ref 39–117)
ALT: 23 U/L (ref 0–35)
AST: 20 U/L (ref 0–37)
Albumin: 3 g/dL — ABNORMAL LOW (ref 3.5–5.2)
BUN: 10 mg/dL (ref 6–23)
CHLORIDE: 103 meq/L (ref 96–112)
CO2: 28 meq/L (ref 19–32)
Calcium: 9.3 mg/dL (ref 8.4–10.5)
Creatinine, Ser: 0.6 mg/dL (ref 0.50–1.10)
GFR calc Af Amer: >90 mL/min (ref >90)
GLUCOSE: 111 mg/dL — AB (ref 70–99)
POTASSIUM: 4.5 meq/L (ref 3.7–5.3)
SODIUM: 139 meq/L (ref 137–147)
TOTAL PROTEIN: 6.5 g/dL (ref 6.0–8.3)
Total Bilirubin: 0.3 mg/dL (ref 0.3–1.2)

## 2013-10-30 LAB — GLUCOSE, CAPILLARY
Glucose-Capillary: 138 mg/dL — ABNORMAL HIGH (ref 70–99)
Glucose-Capillary: 117 mg/dL — ABNORMAL HIGH (ref 70–99)
Glucose-Capillary: 114 mg/dL — ABNORMAL HIGH (ref 70–99)

## 2013-10-30 NOTE — Progress Notes (Signed)
Patient ID: Lauren Campos, female   DOB: 1955-10-07, 58 y.o.   MRN: [id number]      [address]       [place] [address]             [phone]                 2 Days Post-Op Procedure(s) (LRB): VIDEO BRONCHOSCOPY (N/A) VIDEO ASSISTED THORACOSCOPY (VATS) lung left,  mini left thorocotomy, wedge resection left upper lobe nodule, lymph node dissection. (Left)  LOS: 2 days   Subjective: Feels better today  Objective: Vital signs in last 24 hours: Patient Vitals for the past 24 hrs:  BP Temp Temp src Pulse Resp SpO2  10/30/13 0445 108/60 mmHg 97.5 F (36.4 C) Oral 90 23 98 %  10/29/13 2345 147/90 mmHg 98.6 F (37 C) Oral 95 33 97 %  10/29/13 1915 122/61 mmHg 98.1 F (36.7 C) Oral 91 13 97 %  10/29/13 1450 121/70 mmHg 97.8 F (36.6 C) Oral 84 16 99 %  10/29/13 1311 123/66 mmHg 98.4 F (36.9 C) Oral - - -  10/29/13 1203 - 97.8 F (36.6 C) Oral - - -  10/29/13 1200 - - - 95 16 100 %  10/29/13 1100 154/77 mmHg - - 80 15 98 %  10/29/13 1000 121/64 mmHg - - 79 16 99 %  10/29/13 0900 107/65 mmHg - - 80 15 99 %    Filed Weights   10/28/13 0722 10/29/13 0500  Weight: 228 lb (103.42 kg) 233 lb 0.4 oz (105.7 kg)    Hemodynamic parameters for last 24 hours:    Intake/Output from previous day: 06/09 0701 - 06/10 0700 In: 840.9 [P.O.:240; I.V.:550.9; IV Piggyback:50] Out: 4128 [Urine:1500; Chest Tube:160] Intake/Output this shift:    Scheduled Meds: . acetaminophen  1,000 mg Oral 4 times per day   Or  . acetaminophen (TYLENOL) oral liquid 160 mg/5 mL  1,000 mg Oral 4 times per day  . amitriptyline  100 mg Oral QHS  . [START ON 10/31/2013] betamethasone valerate ointment  1 application Topical N86V  . bisacodyl  10 mg Oral Daily  . Chlorhexidine Gluconate Cloth  6 each Topical Q0600  . enoxaparin (LOVENOX) injection  30 mg Subcutaneous Q24H  . fentaNYL   Intravenous 6 times per day  . insulin aspart  0-24 Units Subcutaneous 4 times per day  .  mupirocin ointment  1 application Nasal BID  . pantoprazole  40 mg Oral Daily  . senna-docusate  1 tablet Oral QHS   Continuous Infusions: . dextrose 5 % and 0.9% NaCl 10 mL/hr at 10/29/13 1900   PRN Meds:.diphenhydrAMINE, diphenhydrAMINE, guaiFENesin, naloxone, ondansetron (ZOFRAN) IV, ondansetron (ZOFRAN) IV, oxyCODONE, potassium chloride, sodium chloride, traMADol  General appearance: alert, cooperative and no distress Neurologic: intact Heart: regular rate and rhythm, S1, S2 normal, no murmur, click, rub or gallop Lungs: diminished breath sounds LLL Abdomen: soft, non-tender; bowel sounds normal; no masses,  no organomegaly Extremities: extremities normal, atraumatic, no cyanosis or edema and Homans sign is negative, no sign of DVT Wound: no air leak  Lab Results: CBC: Recent Labs  10/29/13 0500 10/30/13 0450  WBC 11.5* 7.6  HGB 11.6* 11.5*  HCT 36.3 36.9  PLT 245 234   BMET:  Recent Labs  10/29/13 0500 10/30/13 0450  NA 137 139  K 4.5 4.5  CL 100 103  CO2 24 28  GLUCOSE 129* 111*  BUN 7 10  CREATININE 0.50 0.60  CALCIUM 9.2 9.3    PT/INR: No results found for this basename: LABPROT, INR,  in the last 72 hours   Radiology Dg Chest Port 1 View  10/30/2013   CLINICAL DATA:  Postop.  Evaluate chest tube.  EXAM: PORTABLE CHEST - 1 VIEW  COMPARISON:  10/29/2013  FINDINGS: The patient is rotated to the right. Right jugular central venous catheter remains in place with tip overlying the lower SVC. Left-sided chest tubes remain in place. Evaluation of the cardiomediastinal silhouette is limited by patient rotation. There is a tiny left apical pneumothorax. Patchy parenchymal opacities in the left mid lung are similar to the prior study. Patchy opacities in both lung bases are stable to slightly increased. No pleural effusion is identified.  IMPRESSION: 1. Tiny left apical pneumothorax.  Left-sided chest tubes in place. 2. Patchy bilateral lung opacities, slightly increased  in the bases compared to the prior study. Findings may reflect a combination of atelectasis and infiltrate.   Electronically Signed   By: Logan Bores   On: 10/30/2013 07:43   Dg Chest Port 1 View  10/29/2013   CLINICAL DATA:  Minithoracotomy for lung lesion resections  EXAM: PORTABLE CHEST - 1 VIEW  COMPARISON:  October 28, 2013  FINDINGS: Endotracheal tube is been removed. Central catheter tip is in the superior vena cava. There are 2 chest tubes on the left. There is no appreciable pneumothorax.  There is persistent focal consolidation in the right lower lobe abutting the major fissure. There is patchy atelectatic change in the left mid and upper lobes. Elsewhere the lungs are clear. Heart is mildly enlarged with normal pulmonary vascularity.  IMPRESSION: No pneumothorax. Atelectatic change left mid upper lung zones, stable. Focal consolidation right lower lobe abutting the major fissure, stable. No new opacity. No change in cardiac silhouette.   Electronically Signed   By: Lowella Grip M.D.   On: 10/29/2013 07:38   Dg Chest Port 1 View  10/28/2013   ADDENDUM REPORT: 10/28/2013 15:52  ADDENDUM: Study discussed by telephone with RN Avon Gully on 10/28/2013 at 15:46 hrs.  Also, gastric band is incidentally re- identified on this image.   Electronically Signed   By: Lars Pinks M.D.   On: 10/28/2013 15:52   10/28/2013   CLINICAL DATA:  58 year old female status post VATS and central line. Initial encounter.  EXAM: PORTABLE CHEST - 1 VIEW  COMPARISON:  10/28/2013 at 0652 hr and earlier.  FINDINGS: Seated upright AP portable view at 1457 hrs. Endotracheal tube tip at the carina. Right IJ central line tip at the lower SVC level, just below the carina. Left chest tubes in place.  No pneumothorax. There is confluent opacity along the right major or minor fissure which most resembles atelectasis. There is vague postoperative opacity in the left upper lobe. Normal cardiac size and mediastinal contours.  IMPRESSION: 1.  Endotracheal tube tip at the carina, retract 2 cm for more optimal placement. 2. Right IJ central line tip at the lower SVC level. Left chest tubes in place. No pneumothorax. 3. Postoperative opacity in the left lung. Mild atelectasis on the right.  Electronically Signed: By: Lars Pinks M.D. On: 10/28/2013 15:41     Assessment/Plan: S/P Procedure(s) (LRB): VIDEO BRONCHOSCOPY (N/A) VIDEO ASSISTED THORACOSCOPY (VATS) lung left,  mini left thorocotomy, wedge resection left upper lobe nodule, lymph node dissection. (Left) Mobilize d/c tubes/lines   [name] MD 10/30/2013 8:21 AM

## 2013-10-31 ENCOUNTER — Inpatient Hospital Stay (HOSPITAL_COMMUNITY): Payer: BC Managed Care – PPO

## 2013-10-31 ENCOUNTER — Telehealth: Payer: Self-pay | Admitting: Internal Medicine

## 2013-10-31 NOTE — Progress Notes (Addendum)
TCTS DAILY ICU PROGRESS NOTE                   [address]            [place] [address]          [phone]   3 Days Post-Op Procedure(s) (LRB): VIDEO BRONCHOSCOPY (N/A) VIDEO ASSISTED THORACOSCOPY (VATS) lung left,  mini left thorocotomy, wedge resection left upper lobe nodule, lymph node dissection. (Left)  Total Length of Stay:  LOS: 3 days   Subjective: Not SOB, pain controlled  Objective: Vital signs in last 24 hours: Temp:  [97.7 F (36.5 C)-98.6 F (37 C)] 98.6 F (37 C) (06/11 0446) Pulse Rate:  [89-98] 89 (06/11 0446) Cardiac Rhythm:  [-] Normal sinus rhythm (06/10 1659) Resp:  [16-23] 18 (06/11 0623) BP: (111-141)/(59-75) 141/63 mmHg (06/11 0446) SpO2:  [91 %-100 %] 95 % (06/11 0623)  Filed Weights   10/28/13 0722 10/29/13 0500  Weight: 228 lb (103.42 kg) 233 lb 0.4 oz (105.7 kg)    Weight change:    Hemodynamic parameters for last 24 hours:    Intake/Output from previous day: 06/10 0701 - 06/11 0700 In: 995 [P.O.:840; I.V.:155] Out: 1510 [Urine:1350; Chest Tube:160]  Intake/Output this shift:    Current Meds: Scheduled Meds: . acetaminophen  1,000 mg Oral 4 times per day   Or  . acetaminophen (TYLENOL) oral liquid 160 mg/5 mL  1,000 mg Oral 4 times per day  . amitriptyline  100 mg Oral QHS  . betamethasone valerate ointment  1 application Topical V42V  . bisacodyl  10 mg Oral Daily  . Chlorhexidine Gluconate Cloth  6 each Topical Q0600  . enoxaparin (LOVENOX) injection  30 mg Subcutaneous Q24H  . fentaNYL   Intravenous 6 times per day  . mupirocin ointment  1 application Nasal BID  . pantoprazole  40 mg Oral Daily  . senna-docusate  1 tablet Oral QHS   Continuous Infusions: . dextrose 5 % and 0.9% NaCl 10 mL/hr at 10/29/13 1900   PRN Meds:.diphenhydrAMINE, diphenhydrAMINE, guaiFENesin, naloxone, ondansetron (ZOFRAN) IV, ondansetron (ZOFRAN) IV, oxyCODONE, potassium chloride, sodium chloride, traMADol  General  appearance: alert, cooperative and no distress Heart: regular rate and rhythm Lungs: somewhat coarse, no wheeze Abdomen: benign Extremities: warm Wound: incis ok  Lab Results: CBC: Recent Labs  10/29/13 0500 10/30/13 0450  WBC 11.5* 7.6  HGB 11.6* 11.5*  HCT 36.3 36.9  PLT 245 234   BMET:  Recent Labs  10/29/13 0500 10/30/13 0450  NA 137 139  K 4.5 4.5  CL 100 103  CO2 24 28  GLUCOSE 129* 111*  BUN 7 10  CREATININE 0.50 0.60  CALCIUM 9.2 9.3    PT/INR: No results found for this basename: LABPROT, INR,  in the last 72 hours Radiology: Dg Chest Port 1 View  10/30/2013   CLINICAL DATA:  Postop.  Evaluate chest tube.  EXAM: PORTABLE CHEST - 1 VIEW  COMPARISON:  10/29/2013  FINDINGS: The patient is rotated to the right. Right jugular central venous catheter remains in place with tip overlying the lower SVC. Left-sided chest tubes remain in place. Evaluation of the cardiomediastinal silhouette is limited by patient rotation. There is a tiny left apical pneumothorax. Patchy parenchymal opacities in the left mid lung are similar to the prior study. Patchy opacities in both lung bases are stable to slightly increased. No pleural effusion is identified.  IMPRESSION: 1. Tiny left apical pneumothorax.  Left-sided chest tubes  in place. 2. Patchy bilateral lung opacities, slightly increased in the bases compared to the prior study. Findings may reflect a combination of atelectasis and infiltrate.   Electronically Signed   By: Logan Bores   On: 10/30/2013 07:43   CXR- bilat alveolar opacities Chest tube- no air leak, 160 cc /24 hours    Assessment/Plan: S/P Procedure(s) (LRB): VIDEO BRONCHOSCOPY (N/A) VIDEO ASSISTED THORACOSCOPY (VATS) lung left,  mini left thorocotomy, wedge resection left upper lobe nodule, lymph node dissection. (Left)  1 doing well 2 wean O2 off, cont pulm toilet/rehab. Follow-up WBC- she has been afebrile. pulm infiltrates need close observation as may require  further tx 3 d/c chest tube and pca 4 poss home in 24-48 hours   GOLD,WAYNE E 10/31/2013 7:39 AM  Discussed in [place] conference, no chemo indicated, discussed close follow up in the furture with the patient I have seen and examined Bunnie Domino and agree with the above assessment  and plan.  [name] MD Beeper [phone] Office [phone] 10/31/2013 8:52 AM

## 2013-10-31 NOTE — Telephone Encounter (Signed)
Called pt. appt scheduled for august. Nothing further needed

## 2013-10-31 NOTE — Discharge Summary (Signed)
Physician Discharge Summary       [address]       [place] [address]             [phone]    Patient ID: Lauren Campos MRN: [id number] DOB/AGE: November 05, 1955 58 y.o.  Admit date: 10/28/2013 Discharge date: 11/02/2013  Admission Diagnoses: 1. Left upper lobe lesion 2. History of COPD 3. History of tobacco abuse  Discharge Diagnoses:  1. Left upper lobe lesion 2. History of COPD 3. History of tobacco abuse   Procedure (s):  Bronchoscopy, left video-assisted thoracoscopy,  minithoracotomy, wedge resection of left upper lobe, and lymph node  Dissection by Dr. Servando Snare on 10/28/2013.  Pathology: 1. 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 - SEPARATE INCIDENTAL NEUROENDOCRINE TUMORLET. - THE SURGICAL RESECTION MARGINS ARE NEGATIVE FOR ADENOCARCINOMA. - SEE ONCOLOGY TABLE BELOW. 2. Lymph node, biopsy, 10L - THERE IS NO EVIDENCE OF CARCINOMA IN 1 OF 1 LYMPH NODE (0/1). 3. Lymph node, biopsy, 11L - THERE IS NO EVIDENCE OF CARCINOMA IN 1 OF 1 LYMPH NODE (0/1). 4. Lymph node, biopsy, 4L - THERE IS NO EVIDENCE OF CARCINOMA IN 1 OF 1 LYMPH NODE (0/1). 5. Lymph node, biopsy, 2L - THERE IS NO EVIDENCE OF CARCINOMA IN 1 OF 1 LYMPH NODE (0/1). 6. Lymph node, biopsy, 4L #2 - THERE IS NO EVIDENCE OF CARCINOMA IN 1 OF 1 LYMPH NODE (0/1). 7. Lymph node, biopsy, 5 - THERE IS NO EVIDENCE OF CARCINOMA IN 1 OF 1 LYMPH NODE (0/1).  TNM CODE: pT1a, pN0  History of Presenting Illness: This is a 58 year old female who was seen in the office for as a second opinion for resection of left upper lobe lung nodule, after a cardiac clearence. The patient noted neck swelling in February of 2014. A CT scan of the neck at that time revealed a 5.9 mm left upper lobe lung lesion(06/26/2012). A repeat scan showed enlargement of the left upper lobe lung nodule 8.5 mm on scan noted 04/04/2013. Repeat super D. CT  scan of the chest March 2015 revealed the lesion had enlarged to 11 mm. The patient was to undergo surgical resection of the left upper lobe lung lesion for treatment and for diagnosis 6 weeks ago;however, with induction of anesthesia, she had significant amount of vomiting with aspiration. As a result, bronchoscopy only was performed and the patient recovered from the event. She has a lap band in place and since last seen has had the fluid removed from band. CT scan showed dilated esophagus with air-fluid levels. Dr. Servando Snare recommended a wedge resection to determine the pathology of the left upper lung lesion. Potential risks, benefits, and complications were discussed with the patient and she agreed to proceed with surgery. Prior to undergoing surgery, she was given cardiac clearance by Dr. Gwenlyn Found. She underwent a bronchoscopy, left VATS, left mini thoracotomy, and wedge LUL on 10/28/2013.  Brief Hospital Course:  She has remained afebrile and hemodynamically stable. Her a line and foley were removed early in her post operative course. Daily chest x rays were obtained and remained stable. Chest tubes did not have an air leak. She was transferred from the ICU to 3300 for further convalescence on 10/30/2013. All chest tubes were removed by 6/11. She is ambulating on room air; however, when she ambulated on 6/12on room air, her oxygen saturation decreased into the 70's. She is currently on 1 liters of oxygen via [place] and  will require oxygen at home. She is tolerating a diet. Provided she remains afebrile, hemodynamically stable, and chest x ray remains stable, she will be surgically stable for discharge on 11/01/2013.   Latest Vital Signs: Blood pressure 128/62, pulse 81, temperature 98.3 F (36.8 C), temperature source Oral, resp. rate 15, height 5\' 1"  (1.549 m), weight 233 lb 0.4 oz (105.7 kg), SpO2 98.00%.  Physical Exam: Cardiovascular: RRR  Pulmonary: Slightly diminished left base;right lung is clear;  no rales, wheezes, or rhonchi.  Abdomen: Soft, non tender, bowel sounds present.  Wounds: Clean and dry. No erythema or signs of infection.    Discharge Condition:Stable  Recent laboratory studies:  Lab Results  Component Value Date   WBC 7.6 10/30/2013   HGB 11.5* 10/30/2013   HCT 36.9 10/30/2013   MCV 96.6 10/30/2013   PLT 234 10/30/2013   Lab Results  Component Value Date   NA 139 10/30/2013   K 4.5 10/30/2013   CL 103 10/30/2013   CO2 28 10/30/2013   CREATININE 0.60 10/30/2013   GLUCOSE 111* 10/30/2013      Diagnostic Studies:Dg Chest Port 1 View  10/30/2013   CLINICAL DATA:  Postop.  Evaluate chest tube.  EXAM: PORTABLE CHEST - 1 VIEW  COMPARISON:  10/29/2013  FINDINGS: The patient is rotated to the right. Right jugular central venous catheter remains in place with tip overlying the lower SVC. Left-sided chest tubes remain in place. Evaluation of the cardiomediastinal silhouette is limited by patient rotation. There is a tiny left apical pneumothorax. Patchy parenchymal opacities in the left mid lung are similar to the prior study. Patchy opacities in both lung bases are stable to slightly increased. No pleural effusion is identified.  IMPRESSION: 1. Tiny left apical pneumothorax.  Left-sided chest tubes in place. 2. Patchy bilateral lung opacities, slightly increased in the bases compared to the prior study. Findings may reflect a combination of atelectasis and infiltrate.   Electronically Signed   By: Logan Bores   On: 10/30/2013 07:43   Discharge Medications:   Medication List         amitriptyline 100 MG tablet  Commonly known as:  ELAVIL  Take 1 tablet (100 mg total) by mouth at bedtime.     betamethasone valerate 0.1 % cream  Commonly known as:  VALISONE  Apply 1 application topically every 3 (three) days. Applies ot face     oxyCODONE 5 MG immediate release tablet  Commonly known as:  Oxy IR/ROXICODONE  Take 1-2 tablets (5-10 mg total) by mouth every 4 (four) hours as  needed for severe pain.        Follow Up Appointments: Follow-up Information   Follow up with [name], MD On 11/14/2013. (PA/LAT CXR to be taken (at [facility] which is in the same building as Dr. Everrett Coombe office) on 11/14/2013 at 1:30 pm;Appointment with Dr. Servando Snare is at 2:30 pm)    Specialty:  Cardiothoracic Surgery   Contact information:   [address] [place] [address] [phone]       Follow up with [facility], MD. (Call for a follow up appointment for 2 months)    Specialty:  Pulmonary Disease   Contact information:   [place] [address] [phone]       Signed: Lars Pinks MPA-C 11/02/2013, 1:31 PM

## 2013-10-31 NOTE — Telephone Encounter (Signed)
Lauren Campos  10/17/13 she had surgery for lung cancer. Please give her pulmonary fu in 2 months  Dr. Brand Males, M.D., [facility].C.P Pulmonary and Critical Care Medicine Staff Physician [place] [facility] Pager: [phone], If no answer or between  15:00h - 7:00h: call [phone]  10/31/2013 5:26 AM

## 2013-10-31 NOTE — Discharge Instructions (Signed)
ACTIVITY:  1.Increase activity slowly. 2.Walk daily and increase frequency and duration as tolerates. 3.May walk up steps. 4.No lifting more than ten pounds for two weeks. 5.No driving for two weeks. 6.Avoid straining. 7.STOP any activity that causes chest pain, shortness of breath, dizziness,sweating,     or excessive weakness. 8.Continue with breathing exercises daily.  DIET:  Low fat, Low salt diet   WOUND:  1.May shower. 2.Clean wounds with mild soap and water.  Call the office at [phone] if any problems arise.  Video-Assisted Thoracic Surgery Care After Refer to this sheet in the next few weeks. These instructions provide you with information on caring for yourself after your procedure. Your caregiver may also give you more specific instructions. Your procedure has been planned according to current medical practices, but problems sometimes occur. Call your caregiver if you have any problems or questions after your procedure. HOME CARE INSTRUCTIONS   Only take over-the-counter or prescription medications as directed.  Only take pain medications (narcotics) as directed.  Do not drive until your caregiver approves. Driving while taking narcotics or soon after surgery can be dangerous, so discuss the specific timing with your caregiver.  Avoid activities that use your chest muscles, such as lifting heavy objects, for at least 3 4 weeks.   Take deep breaths to expand the lungs and to protect against pneumonia.  Do breathing exercises as directed by your caregiver. If you were given an incentive spirometer to help with breathing, use it as directed.  You may resume a normal diet and activities when you feel you are able to or as directed.  Do not take a bath until your caregiver says it is OK. Use the shower instead.   Keep the bandage (dressing) covering the area where the chest tube was inserted (incision site) dry for 48 hours. After 48 hours, remove the dressing  unless there is new drainage.  Remove dressings as directed by your caregiver.  Change dressings if necessary or as directed.  Keep all follow-up appointments. It is important for you to see your caregiver after surgery to discuss appropriate follow-up care and surveillance, if it is necessary. SEEK MEDICAL CARE:  You feel excessive or increasing pain at an incision site.  You notice bleeding, skin irritation, drainage, swelling, or redness at an incision site.  There is a bad smell coming from an incision or dressing.  It feels like your heart is fluttering or beating rapidly.  Your pain medication does not relieve your pain. SEEK IMMEDIATE MEDICAL CARE IF:   You have a fever.   You have chest pain.  You have a rash.  You have shortness of breath.  You have trouble breathing.   You feel weak, lightheaded, dizzy, or faint.  MAKE SURE YOU:   Understand these instructions.   Will watch your condition.   Will get help right away if you are not doing well or get worse. Document Released: 09/03/2012 Document Reviewed: 09/03/2012 [facility] Patient Information 2014 [place], [place].

## 2013-11-01 ENCOUNTER — Inpatient Hospital Stay (HOSPITAL_COMMUNITY): Payer: BC Managed Care – PPO

## 2013-11-01 ENCOUNTER — Encounter: Payer: Self-pay | Admitting: *Deleted

## 2013-11-01 MED ORDER — LACTULOSE 10 GM/15ML PO SOLN
20.0000 g | Freq: Once | ORAL | Status: AC
  Administered 2013-11-01: 20 g via ORAL
  Filled 2013-11-01: qty 30

## 2013-11-01 MED ORDER — OXYCODONE HCL 5 MG PO TABS: 5.0000 mg | ORAL_TABLET | ORAL | Status: DC | PRN

## 2013-11-01 NOTE — Progress Notes (Signed)
      [address]       [place] [address]             [phone]       4 Days Post-Op Procedure(s) (LRB): VIDEO BRONCHOSCOPY (N/A) VIDEO ASSISTED THORACOSCOPY (VATS) lung left,  mini left thorocotomy, wedge resection left upper lobe nodule, lymph node dissection. (Left)  Subjective: No bowel movement yet.  Objective: Vital signs in last 24 hours: Temp:  [98.2 F (36.8 C)-98.9 F (37.2 C)] 98.2 F (36.8 C) (06/12 0350) Pulse Rate:  [76-101] 76 (06/12 0350) Cardiac Rhythm:  [-] Normal sinus rhythm (06/12 0350) Resp:  [12-20] 13 (06/12 0350) BP: (92-140)/(42-86) 140/86 mmHg (06/12 0350) SpO2:  [92 %-96 %] 96 % (06/12 0350)     Physical Exam:  Cardiovascular: RRR Pulmonary: Slightly diminished left base;right lung is clear; no rales, wheezes, or rhonchi. Abdomen: Soft, non tender, bowel sounds present. Wounds: Clean and dry.  No erythema or signs of infection.   Lab Results: CBC: Recent Labs  10/30/13 0450  WBC 7.6  HGB 11.5*  HCT 36.9  PLT 234   BMET:  Recent Labs  10/30/13 0450  NA 139  K 4.5  CL 103  CO2 28  GLUCOSE 111*  BUN 10  CREATININE 0.60  CALCIUM 9.3    PT/INR: No results found for this basename: LABPROT, INR,  in the last 72 hours ABG:  INR: Will add last result for INR, ABG once components are confirmed Will add last 4 CBG results once components are confirmed  Assessment/Plan:  1. CV - SR 2.  Pulmonary - CXR appears stable (appears to show trace left apical pneumothorax) 3. LOC constipation 4. Possible discharge today   ZIMMERMAN,DONIELLE MPA-C 11/01/2013,7:43 AM

## 2013-11-01 NOTE — [facility] Oncology Navigator Note (Signed)
Patient currently a patient at [facility] following surgery for lung cancer.  I called patient to check in.  Patient reports that she is doing well and was going to go home today but she needs to stay tonight to get her oxygen level up.  She reports that she feels pretty good and is anxious to go home.  She denied any questions or other concerns at this time.  I encouraged her to call me for any needs.

## 2013-11-01 NOTE — Progress Notes (Addendum)
RA O2 SAT at resting 87%.  Ambulating RA SAT dropped to 74%.   Reported to Lars Pinks PA at 1132. No new orders. Was told to notify MD.

## 2013-11-01 NOTE — Progress Notes (Signed)
SATURATION QUALIFICATIONS: (This note is used to comply with regulatory documentation for home oxygen)  Patient Saturations on Room Air at Rest = 87%  Patient Saturations on Room Air while Ambulating = 74%  Patient Saturations on 3 Liters of oxygen while Ambulating = 97%  Please briefly explain why patient needs home oxygen: Hx: COPD, Post op VATS

## 2013-11-01 NOTE — Care Management Note (Signed)
    Page 1 of 1   11/01/2013     2:09:40 PM CARE MANAGEMENT NOTE 11/01/2013  Patient:  APRYLE, STOWELL   Account Number:  [id number]  Date Initiated:  10/29/2013  Documentation initiated by:  [facility]  Subjective/Objective Assessment:   Admitted to ICU post op VATS and mini thoracotomy for Lung mass.     Action/Plan:   Anticipated DC Date:  11/01/2013   Anticipated DC Plan:  [place]  CM consult      Choice offered to / List presented to:  NA   DME arranged  [facility]      DME agency  [place].        Status of service:  In process, will continue to follow Medicare Important Message given?   (If response is "NO", the following Medicare IM given date fields will be blank) Date Medicare IM given:   Date Additional Medicare IM given:    Discharge Disposition:  HOME/SELF CARE  Per UR Regulation:  Reviewed for med. necessity/level of care/duration of stay  If discussed at [place] of Stay Meetings, dates discussed:    Comments:  Contact:  Rikard,Tiffany Daughter [phone]                 Francena Hanly Sister     [phone]  11/01/13- 79- Marvetta Gibbons RN, BSN [phone] Order for home [phone], spoke with [facility] [place] [facility] with [facility]- 02 is to be delivered to pt's room by [facility] prior to pt's discharge.

## 2013-11-01 NOTE — Progress Notes (Signed)
Notified Dr. Servando Snare at 1145 of pts ambulating O2 sat. Received order to hold d/c.

## 2013-11-02 ENCOUNTER — Inpatient Hospital Stay (HOSPITAL_COMMUNITY): Payer: BC Managed Care – PPO

## 2013-11-02 NOTE — Progress Notes (Signed)
Discharge instructions given at 1430. Pt d/c home via family vehicle. IJ D/C'd. Tip intact. Pt tolerated well. No questions or concerns at this time.

## 2013-11-02 NOTE — Progress Notes (Signed)
      [address]       [place] [address]             [phone]       5 Days Post-Op Procedure(s) (LRB): VIDEO BRONCHOSCOPY (N/A) VIDEO ASSISTED THORACOSCOPY (VATS) lung left,  mini left thorocotomy, wedge resection left upper lobe nodule, lymph node dissection. (Left)  Subjective: Patient just finished lunch. Feels fairly well and would like to go home.  Objective: Vital signs in last 24 hours: Temp:  [97.9 F (36.6 C)-99.4 F (37.4 C)] 98.3 F (36.8 C) (06/13 1142) Pulse Rate:  [81-97] 81 (06/13 0742) Cardiac Rhythm:  [-] Normal sinus rhythm (06/13 0800) Resp:  [14-19] 15 (06/13 0501) BP: (108-139)/(52-67) 128/62 mmHg (06/13 0742) SpO2:  [92 %-98 %] 98 % (06/13 0742) 06/12 0701 - 06/13 0700 In: 240 [P.O.:240] Out: -    Physical Exam:  Cardiovascular: RRR Pulmonary: Slightly diminished left base;right lung is clear; no rales, wheezes, or rhonchi. Abdomen: Soft, non tender, bowel sounds present. Wounds: Clean and dry.  No erythema or signs of infection.   Lab Results: CBC:No results found for this basename: WBC, HGB, HCT, PLT,  in the last 72 hours BMET: No results found for this basename: NA, K, CL, CO2, GLUCOSE, BUN, CREATININE, CALCIUM,  in the last 72 hours  PT/INR: No results found for this basename: LABPROT, INR,  in the last 72 hours ABG:  INR: Will add last result for INR, ABG once components are confirmed Will add last 4 CBG results once components are confirmed  Assessment/Plan:  1. CV - SR 2.  Pulmonary - Desats with ambulation on room air. Will need oxygen at discharge.CXR appears stable (appears to show decreased trace left apical pneumothorax and right base atelectasis). 3. Will discharge home with oxygen   Shiela Bruns MPA-C 11/02/2013,1:25 PM

## 2013-11-05 ENCOUNTER — Encounter (HOSPITAL_COMMUNITY): Payer: Self-pay

## 2013-11-07 ENCOUNTER — Encounter (INDEPENDENT_AMBULATORY_CARE_PROVIDER_SITE_OTHER): Payer: BC Managed Care – PPO

## 2013-11-08 ENCOUNTER — Encounter (HOSPITAL_COMMUNITY): Payer: Self-pay

## 2013-11-11 ENCOUNTER — Other Ambulatory Visit: Payer: Self-pay | Admitting: Cardiothoracic Surgery

## 2013-11-11 DIAGNOSIS — C3490 Malignant neoplasm of unspecified part of unspecified bronchus or lung: Secondary | ICD-10-CM

## 2013-11-14 ENCOUNTER — Ambulatory Visit (INDEPENDENT_AMBULATORY_CARE_PROVIDER_SITE_OTHER): Payer: BC Managed Care – PPO | Admitting: Cardiothoracic Surgery

## 2013-11-14 ENCOUNTER — Other Ambulatory Visit: Payer: Self-pay | Admitting: *Deleted

## 2013-11-14 ENCOUNTER — Encounter: Payer: Self-pay | Admitting: Cardiothoracic Surgery

## 2013-11-14 ENCOUNTER — Ambulatory Visit (INDEPENDENT_AMBULATORY_CARE_PROVIDER_SITE_OTHER): Payer: BC Managed Care – PPO | Admitting: Physician Assistant

## 2013-11-14 ENCOUNTER — Encounter (INDEPENDENT_AMBULATORY_CARE_PROVIDER_SITE_OTHER): Payer: Self-pay

## 2013-11-14 ENCOUNTER — Ambulatory Visit
Admission: RE | Admit: 2013-11-14 | Discharge: 2013-11-14 | Disposition: A | Discharge status: Home / Self Care | Payer: BC Managed Care – PPO | Source: Ambulatory Visit | Attending: Cardiothoracic Surgery | Admitting: Cardiothoracic Surgery

## 2013-11-14 VITALS — BP 155/80 | HR 78 | Temp 97.8°F | Resp 16 | Ht 61.0 in | Wt 232.6 lb

## 2013-11-14 VITALS — BP 156/92 | HR 79 | Resp 16 | Ht 61.0 in | Wt 220.0 lb

## 2013-11-14 DIAGNOSIS — C3490 Malignant neoplasm of unspecified part of unspecified bronchus or lung: Secondary | ICD-10-CM

## 2013-11-14 DIAGNOSIS — Z4651 Encounter for fitting and adjustment of gastric lap band: Secondary | ICD-10-CM

## 2013-11-14 DIAGNOSIS — Z09 Encounter for follow-up examination after completed treatment for conditions other than malignant neoplasm: Secondary | ICD-10-CM

## 2013-11-14 DIAGNOSIS — C3410 Malignant neoplasm of upper lobe, unspecified bronchus or lung: Secondary | ICD-10-CM

## 2013-11-14 MED ORDER — OXYCODONE HCL 5 MG PO CAPS: 5.0000 mg | ORAL_CAPSULE | Freq: Four times a day (QID) | ORAL | Status: DC | PRN

## 2013-11-14 NOTE — Progress Notes (Signed)
[address]       [place] [address]             [phone]      Lauren Campos [facility] Medical Record #[id number] Date of Birth: December 02, 1955  Referring: Brand Males, MD Primary Care: Nyoka Cowden, MD  Chief Complaint:   POST OP FOLLOW UP 10/28/2013  DATE OF DISCHARGE:  OPERATIVE REPORT  POSTOPERATIVE DIAGNOSIS: Left upper lobe lung lesion.  POSTOPERATIVE DIAGNOSIS: Left upper lobe lung lesion, probable  carcinoma, atypia on frozen section.  PROCEDURE PERFORMED: Bronchoscopy, left video-assisted thoracoscopy,  minithoracotomy, wedge resection of left upper lobe, and lymph node  dissection.   Adenocarcinoma of lung, stage 1   Primary site: Lung (Left)   Staging method: AJCC 7th Edition   Pathologic: Stage IA (T1a, N0, cM0) signed by [name], MD on 10/29/2013  5:11 PM   Summary: Stage IA (T1a, N0, cM0)  History of Present Illness:     Patient is doing well postoperatively. She was initially discharged home on oxygen because of low O2 saturations. Her saturation in the office today is 98 on room air. She notes she discontinued it oxygen last week. She is increasing her physical activity appropriately. Return to see the general surgeons about reinflating her lap band. Since discharge she's had no fever or chills, still needs pain medication for the left chest wall discomfort. She notes she is smoking just 2 cigarettes since her surgery.    Past Medical History  Diagnosis Date  .   07/20/2006  . BACK PAIN, LOW 07/20/2006  . BREAST LUMP 09/05/2007    "benign"  . CONSTIPATION, CHRONIC 08/10/2009  . DIVERTICULOSIS, COLON 08/10/2009  . Hemoptysis 08/27/2008  . HYPERLIPIDEMIA 05/08/2007  . HYPERLIPIDEMIA     "not anymore" (08/26/2013)  . INSOMNIA NOS 07/20/2006  . 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 OBESITY, NOS 07/20/2006  . Pulmonary nodule   . Heart murmur   . GERD (gastroesophageal reflux disease)   . Pneumonia 2014    "once" (08/26/2013)    . MIGRAINE, COMMON 05/08/2007    "haven't had one in ~ 6 months" (08/26/2013)  . Lung cancer   . Chest pain   . Dyspnea on exertion   . Tobacco abuse   . COPD (chronic obstructive pulmonary disease)   . Leaky heart valve   . Arthritis   . Adenocarcinoma of lung, stage 1 08/26/2013     History  Smoking status  . Current Every Day Smoker -- 0.25 packs/day for 44 years  . Types: Cigarettes  Smokeless tobacco  . Never Used    History  Alcohol Use No    Comment: occasional wine     No Known Allergies  Current Outpatient Prescriptions  Medication Sig Dispense Refill  . amitriptyline (ELAVIL) 100 MG tablet Take 1 tablet (100 mg total) by mouth at bedtime.  90 tablet  1  . betamethasone valerate (VALISONE) 0.1 % cream Apply 1 application topically every 3 (three) days. Applies ot face      . oxycodone (OXY-IR) 5 MG capsule Take 5-10 mg by mouth every 4 (four) hours as needed.       No current facility-administered medications for this visit.       Physical Exam: BP 156/92  Pulse 79  Resp 16  Ht 5\' 1"  (1.549 m)  Wt 220 lb (99.791 kg)  BMI 41.59 kg/m2  General appearance: alert and cooperative Neurologic: intact Heart: regular rate and rhythm,  S1, S2 normal, no murmur, click, rub or gallop Lungs: clear to auscultation bilaterally Abdomen: soft, non-tender; bowel sounds normal; no masses,  no organomegaly Extremities: extremities normal, atraumatic, no cyanosis or edema and Homans sign is negative, no sign of DVT Wound: The left chest incisions are well-healed without evidence of infection   Diagnostic Studies & Laboratory data:     Recent Radiology Findings:   Dg Chest 2 View  11/14/2013   CLINICAL DATA:  ADENOCARCINOMA OF LUNG  EXAM: CHEST  2 VIEW  COMPARISON:  Two-view chest 11/02/2013  FINDINGS: Cardiac silhouette is enlarged. The very small left apical pneumothorax has resolved. Minimal residual scarring versus atelectasis left lung base. There has been resolution  of the discoid atelectasis left lung base. The postsurgical changes left lung apex have decreased. No acute osseous abnormalities. Right internal jugular catheter has been removed.  IMPRESSION: Resolution of the left apical pneumothorax. Decreased conspicuity of the postsurgical changes left lung apex. Scarring versus atelectasis left lung base also decreased. Resolution of the discoid atelectasis right lung base.   Electronically Signed   By: Margaree Mackintosh M.D.   On: 11/14/2013 14:10      Recent Lab Findings: Lab Results  Component Value Date   WBC 7.6 10/30/2013   HGB 11.5* 10/30/2013   HCT 36.9 10/30/2013   PLT 234 10/30/2013   GLUCOSE 111* 10/30/2013   CHOL 214* 06/07/2011   TRIG 63.0 06/07/2011   HDL 63.70 06/07/2011   LDLDIRECT 130.4 06/07/2011   LDLCALC  Value: 102        Total Cholesterol/HDL:CHD Risk Coronary Heart Disease Risk Table                     Men   Women  1/2 Average Risk   3.4   3.3  Average Risk       5.0   4.4  2 X Average Risk   9.6   7.1  3 X Average Risk  23.4   11.0        Use the calculated Patient Ratio above and the CHD Risk Table to determine the patient's CHD Risk.        ATP III CLASSIFICATION (LDL):  <100     mg/dL   Optimal  100-129  mg/dL   Near or Above                    Optimal  130-159  mg/dL   Borderline  160-189  mg/dL   High  >190     mg/dL   Very High* 05/13/2009   ALT 23 10/30/2013   AST 20 10/30/2013   NA 139 10/30/2013   K 4.5 10/30/2013   CL 103 10/30/2013   CREATININE 0.60 10/30/2013   BUN 10 10/30/2013   CO2 28 10/30/2013   TSH 1.18 06/07/2011   INR 0.94 10/24/2013   HGBA1C  Value: 5.8 (NOTE) The ADA recommends the following therapeutic goal for glycemic control related to Hgb A1c measurement: Goal of therapy: <6.5 Hgb A1c  Reference: American Diabetes Association: Clinical Practice Recommendations 2010, Diabetes Care, 2010, 33: (Suppl  1). 05/12/2009      Assessment / Plan:    Improving physical status following wedge resection and node sampling for  stage IA adenocarcinoma of the left lung. Again discussed with the patient the need to stay off cigarettes. She will return in 3 months with a followup chest x-ray We will discontinue the home oxygen. The patient's radiographic  findings surgical findings and pathology was discussed at the multidisciplinary thoracic oncology conference, the conclusion was that there was no need for further treatment. On followup scans attention to the liver will be watched.   [name] MD      [address] [place] [address] Office [phone]   Beeper [phone]  11/14/2013 2:53 PM

## 2013-11-14 NOTE — Progress Notes (Signed)
  HISTORY: Lauren Campos is a 58 y.o.female who received an 10cm lap-band in July 2005 by Dr. Hassell Done. She comes in with 22 lbs weight gain since her last visit in April and 10 lbs total weight loss since surgery. I removed all fluid from her band in April after the patient had an aspiration event during anesthesia induction. She underwent VATS a couple of weeks ago from which she says she is recovering well. She reported no issues with anesthesia this time around. She is to see her CT surgeon this afternoon. She does complain of significant hunger and weight gain, as anticipated.  VITAL SIGNS: Filed Vitals:   11/14/13 1133  BP: 155/80  Pulse: 78  Temp: 97.8 F (36.6 C)  Resp: 16    PHYSICAL EXAM: Physical exam reveals a very well-appearing 58 y.o.female in no apparent distress Neurologic: Awake, alert, oriented Psych: Bright affect, conversant Respiratory: Breathing even and unlabored. No stridor or wheezing Abdomen: Soft, nontender, nondistended to palpation. Incisions well-healed. No incisional hernias. Port easily palpated. Extremities: Atraumatic, good range of motion.  ASSESMENT: 58 y.o.  female  s/p 10cm lap-band.   PLAN: The patient's port was accessed with a 20G Huber needle without difficulty. Clear fluid was aspirated and 1.75 mL saline was added to the port to give a total predicted volume of 1.75 mL. The patient was able to swallow water without difficulty following the procedure and was instructed to take clear liquids for the next 24-48 hours and advance slowly as tolerated. We'll have her back in three weeks for a likely fill.

## 2013-11-14 NOTE — Patient Instructions (Signed)

## 2013-11-22 ENCOUNTER — Other Ambulatory Visit: Payer: Self-pay | Admitting: Family Medicine

## 2013-12-03 NOTE — Telephone Encounter (Signed)
Encounter complete. 

## 2013-12-05 ENCOUNTER — Encounter (INDEPENDENT_AMBULATORY_CARE_PROVIDER_SITE_OTHER): Payer: BC Managed Care – PPO

## 2013-12-09 ENCOUNTER — Other Ambulatory Visit: Payer: Self-pay | Admitting: *Deleted

## 2013-12-09 DIAGNOSIS — G8918 Other acute postprocedural pain: Secondary | ICD-10-CM

## 2013-12-09 MED ORDER — OXYCODONE HCL 5 MG PO CAPS: 5.0000 mg | ORAL_CAPSULE | Freq: Four times a day (QID) | ORAL | Status: DC | PRN

## 2013-12-12 ENCOUNTER — Encounter (INDEPENDENT_AMBULATORY_CARE_PROVIDER_SITE_OTHER): Payer: BC Managed Care – PPO

## 2014-01-08 ENCOUNTER — Other Ambulatory Visit: Payer: Self-pay | Admitting: Internal Medicine

## 2014-01-13 ENCOUNTER — Ambulatory Visit: Payer: BC Managed Care – PPO | Admitting: Internal Medicine

## 2014-01-13 ENCOUNTER — Encounter: Payer: Self-pay | Admitting: Internal Medicine

## 2014-01-13 ENCOUNTER — Ambulatory Visit (INDEPENDENT_AMBULATORY_CARE_PROVIDER_SITE_OTHER): Payer: BC Managed Care – PPO | Admitting: Internal Medicine

## 2014-01-13 VITALS — BP 110/82 | HR 85 | Ht 61.0 in | Wt 222.0 lb

## 2014-01-13 DIAGNOSIS — K769 Liver disease, unspecified: Secondary | ICD-10-CM | POA: Insufficient documentation

## 2014-01-13 DIAGNOSIS — R911 Solitary pulmonary nodule: Secondary | ICD-10-CM

## 2014-01-13 DIAGNOSIS — R0600 Dyspnea, unspecified: Secondary | ICD-10-CM

## 2014-01-13 DIAGNOSIS — R0789 Other chest pain: Secondary | ICD-10-CM | POA: Insufficient documentation

## 2014-01-13 DIAGNOSIS — F17200 Nicotine dependence, unspecified, uncomplicated: Secondary | ICD-10-CM

## 2014-01-13 DIAGNOSIS — C3492 Malignant neoplasm of unspecified part of left bronchus or lung: Secondary | ICD-10-CM

## 2014-01-13 NOTE — Progress Notes (Signed)
Subjective:    Patient ID: Lauren Campos, female    DOB: 1955-11-28, 58 y.o.   MRN: [id number]  HPI    OV 01/13/2014  Chief Complaint  Patient presents with  . Follow-up    Pt c/o SOB when standing for long periods of time and with exertion. Pt c/o dry cough and an "odd feeling in throat". Pt denies CP/tightness. Pt states since her lung surgery she has had numbness under left breast.   Followup visit for left upper lobe lung nodule and a smoker age 53   She underwent wedge resection of the left upper lobe and lymph node dissection. Diagnosis stage IA adenocarcinoma of the lung. Operative day June 2015. She is doing well overall. Main issues that she is having some postoperative dyspnea on exertion that is relieved by rest. Class II in severity. No associated chest pain diaphoresis, edema, orthopnea, paroxysmal nocturnal dyspnea. There is no associated cough  the new issue is that she is having mild left inframammary numbness and paresthesias occurred afterr surgery and she feels is related to chest tube removal   Also she is concerned about left hepatic lesion seen on CT scan of the chest December 2014 but did not light up on PET scan and is stable on followup imaging in March 2015. Radiology has recommended MRI. Patient does be that cardiothoracic surgery has deferred this to primary care physician   Smoking  She continues to smoke    Review of Systems  Constitutional: Negative for fever and unexpected weight change.  HENT: Negative for congestion, dental problem, ear pain, nosebleeds, postnasal drip, rhinorrhea, sinus pressure, sneezing, sore throat and trouble swallowing.   Eyes: Negative for redness and itching.  Respiratory: Positive for shortness of breath. Negative for cough, chest tightness and wheezing.   Cardiovascular: Negative for palpitations and leg swelling.  Gastrointestinal: Negative for nausea and vomiting.  Genitourinary: Negative for dysuria.    Musculoskeletal: Negative for joint swelling.  Skin: Negative for rash.  Neurological: Negative for headaches.  Hematological: Does not bruise/bleed easily.  Psychiatric/Behavioral: Negative for dysphoric mood. The patient is not nervous/anxious.        Objective:   Physical Exam  Vitals reviewed. Constitutional: She is oriented to person, place, and time. She appears well-developed and well-nourished. No distress.  Body mass index is 41.97 kg/(m^2).   HENT:  Head: Normocephalic and atraumatic.  Right Ear: External ear normal.  Left Ear: External ear normal.  Mouth/Throat: Oropharynx is clear and moist. No oropharyngeal exudate.  Eyes: Conjunctivae and EOM are normal. Pupils are equal, round, and reactive to light. Right eye exhibits no discharge. Left eye exhibits no discharge. No scleral icterus.  Neck: Normal range of motion. Neck supple. No JVD present. No tracheal deviation present. No thyromegaly present.  Cardiovascular: Normal rate, regular rhythm, normal heart sounds and intact distal pulses.  Exam reveals no gallop and no friction rub.   No murmur heard. Pulmonary/Chest: Effort normal and breath sounds normal. No respiratory distress. She has no wheezes. She has no rales. She exhibits no tenderness.  Scar posteriorly left side - keloid +  Abdominal: Soft. Bowel sounds are normal. She exhibits no distension and no mass. There is no tenderness. There is no rebound and no guarding.  Musculoskeletal: Normal range of motion. She exhibits no edema and no tenderness.  Lymphadenopathy:    She has no cervical adenopathy.  Neurological: She is alert and oriented to person, place, and time. She has normal reflexes. No  cranial nerve deficit. She exhibits normal muscle tone. Coordination normal.  Skin: Skin is warm and dry. No rash noted. She is not diaphoretic. No erythema. No pallor.  Keloid in central chest  Psychiatric: She has a normal mood and affect. Her behavior is normal.  Judgment and thought content normal.  Flat affect    Filed Vitals:   01/13/14 1357  BP: 110/82  Pulse: 85  Height: 5\' 1"  (1.549 m)  Weight: 222 lb (100.699 kg)  SpO2: 94%         Assessment & Plan:  #Left upper lobe lung cancer   - removed  - follow with Dr Servando Snare for surveillance  #Left chest wall numbness  - likely due to recent surgery  - discuss with Dr Servando Snare about using neurontin  #Shortness of breath  - do full PFT: ossible post op deconditioning, Body mass index is 41.97 kg/(m^2). and lobectomy contributing to dysppnea  - follow results with NP Tammy  #Hepatic lesion  - left lobe of liver seen dec 2014 and stable as of march 2015  - please addess with Nyoka Cowden, MD but I will send message to him about this to get MRI  #FOllowup  see NP Tammy AFter PFT

## 2014-01-13 NOTE — Patient Instructions (Addendum)
#  Left upper lobe lung cancer   - removed  - follow with Dr Servando Snare for surveillance  #Left chest wall numbness  - likely due to recent surgery  - discuss with Dr Servando Snare about using neurontin  #Shortness of breath  - do full PFT  - follow results with NP Tammy  #Hepatic lesion  - left lobe of liver seen dec 2014 and stable as of march 2015  - please addess with Nyoka Cowden, MD but I will send message to him about this to get MRI  #FOllowup  see NP Tammy AFter PFT

## 2014-01-14 ENCOUNTER — Encounter: Payer: Self-pay | Admitting: Internal Medicine

## 2014-01-14 ENCOUNTER — Ambulatory Visit (INDEPENDENT_AMBULATORY_CARE_PROVIDER_SITE_OTHER): Payer: BC Managed Care – PPO | Admitting: Internal Medicine

## 2014-01-14 VITALS — BP 126/80 | HR 90 | Temp 98.1°F | Resp 20 | Ht 61.0 in | Wt 221.0 lb

## 2014-01-14 DIAGNOSIS — K7689 Other specified diseases of liver: Secondary | ICD-10-CM

## 2014-01-14 DIAGNOSIS — F17200 Nicotine dependence, unspecified, uncomplicated: Secondary | ICD-10-CM

## 2014-01-14 DIAGNOSIS — C3490 Malignant neoplasm of unspecified part of unspecified bronchus or lung: Secondary | ICD-10-CM

## 2014-01-14 DIAGNOSIS — K769 Liver disease, unspecified: Secondary | ICD-10-CM

## 2014-01-14 DIAGNOSIS — C3492 Malignant neoplasm of unspecified part of left bronchus or lung: Secondary | ICD-10-CM

## 2014-01-14 MED ORDER — TRAZODONE HCL 50 MG PO TABS: 25.0000 mg | ORAL_TABLET | Freq: Every evening | ORAL | Status: DC | PRN

## 2014-01-14 MED ORDER — VARENICLINE TARTRATE 0.5 MG X 11 & 1 MG X 42 PO MISC: ORAL | Status: DC

## 2014-01-14 MED ORDER — VARENICLINE TARTRATE 1 MG PO TABS: 1.0000 mg | ORAL_TABLET | Freq: Two times a day (BID) | ORAL | Status: DC

## 2014-01-14 NOTE — Progress Notes (Signed)
Subjective:    Patient ID: Lauren Campos, female    DOB: 1955-07-01, 58 y.o.   MRN: [id number]  HPI  Admit date: 10/28/2013  Discharge date: 11/02/2013  Admission Diagnoses:  1. Left upper lobe lesion  2. History of COPD  3. History of tobacco abuse  Discharge Diagnoses:  1. Left upper lobe lesion  2. History of COPD  3. History of tobacco abuse  Procedure (s):  Bronchoscopy, left video-assisted thoracoscopy,  minithoracotomy, wedge resection of left upper lobe, and lymph node  Dissection by Dr. Servando Snare on 10/28/2013.   Abdominal CT scan of March 2015 : Suspicion of a low-density left hepatic lobe lesion. This was  present on the 04/30/2013 PET and is not definitely hypermetabolic.  Recommend either attention on followup or definitive  characterization with dedicated abdominal MRI.  58 year old patient who is seen today following resection of a left upper lobe nodule on 10/28/2013.  Patient has been diagnosed with poorly differentiated stage IA primary lung cancer.  There has been some concern about a hepatic lesion noted on abdominal CT scan. Patient generally feels well today.  She describes some fullness in her throat area.  She wishes to reconsider Chantix for smoking cessation.  She also is requesting a different sleep aid  Past Medical History  Diagnosis Date  .   07/20/2006  . BACK PAIN, LOW 07/20/2006  . BREAST LUMP 09/05/2007    "benign"  . CONSTIPATION, CHRONIC 08/10/2009  . DIVERTICULOSIS, COLON 08/10/2009  . Hemoptysis 08/27/2008  . HYPERLIPIDEMIA 05/08/2007  . HYPERLIPIDEMIA     "not anymore" (08/26/2013)  . INSOMNIA NOS 07/20/2006  . 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 OBESITY, NOS 07/20/2006  . Pulmonary nodule   . Heart murmur   . GERD (gastroesophageal reflux disease)   . Pneumonia 2014    "once" (08/26/2013)  . MIGRAINE, COMMON 05/08/2007    "haven't had one in ~ 6 months" (08/26/2013)  . Lung cancer   . Chest pain   . Dyspnea on exertion   . Tobacco abuse   . COPD  (chronic obstructive pulmonary disease)   . Leaky heart valve   . Arthritis   . Adenocarcinoma of lung, stage 1 08/26/2013    History   Social History  . Marital Status: Divorced    Spouse Name: N/A    Number of Children: N/A  . Years of Education: N/A   Occupational History  . Not on file.   Social History Main Topics  . Smoking status: Current Some Day Smoker -- 0.25 packs/day for 44 years    Types: Cigarettes  . Smokeless tobacco: Never Used     Comment: smoking 10cigs/week  . Alcohol Use: No     Comment: occasional wine  . Drug Use: No  . Sexual Activity: Not Currently   Other Topics Concern  . Not on file   Social History Narrative  . No narrative on file    Past Surgical History  Procedure Laterality Date  . Laparoscopic gastric banding  ~ 2006  . Colonoscopy    . Bronchoscopy  08/26/2013  . Abdominal hysterectomy  1990's  . Tubal ligation  1980's  . Shoulder arthroscopy w/ rotator cuff repair Left 2014  . Wrist fracture surgery Left ~ 2010  . Video bronchoscopy N/A 08/26/2013    Procedure: VIDEO BRONCHOSCOPY;  Surgeon: Melrose Nakayama, MD;  Location: [facility];  Service: Thoracic;  Laterality: N/A;  . Video bronchoscopy N/A 10/28/2013    Procedure: VIDEO BRONCHOSCOPY;  Surgeon: [name], MD;  Location: [facility];  Service: Thoracic;  Laterality: N/A;  . Video assisted thoracoscopy (vats)/wedge resection Left 10/28/2013    Procedure: VIDEO ASSISTED THORACOSCOPY (VATS) lung left,  mini left thorocotomy, wedge resection left upper lobe nodule, lymph node dissection.;  Surgeon: [name], MD;  Location: [facility];  Service: Thoracic;  Laterality: Left;    Family History  Problem Relation Age of Onset  . Cancer Mother   . Diabetes Mother   . Heart disease Mother   . Cancer Father   . Diabetes Sister   . Heart attack Brother   . Heart disease Brother     No Known Allergies  Current Outpatient Prescriptions on File Prior to Visit  Medication Sig  Dispense Refill  . betamethasone valerate lotion (VALISONE) 0.1 % APPLY 1 APPLICATION TOPICALLY 2 (TWO) TIMES DAILY.  60 mL  2  . ibuprofen (ADVIL,MOTRIN) 800 MG tablet Take 2 tablets by mouth as needed.      Marland Kitchen oxycodone (OXY-IR) 5 MG capsule Take 1 capsule (5 mg total) by mouth every 6 (six) hours as needed.  45 capsule  0   No current facility-administered medications on file prior to visit.    BP 126/80  Pulse 90  Temp(Src) 98.1 F (36.7 C) (Oral)  Resp 20  Ht 5\' 1"  (1.549 m)  Wt 221 lb (100.245 kg)  BMI 41.78 kg/m2  SpO2 96%     Review of Systems  Constitutional: Negative.   HENT: Negative for congestion, dental problem, hearing loss, rhinorrhea, sinus pressure, sore throat and tinnitus.   Eyes: Negative for pain, discharge and visual disturbance.  Respiratory: Negative for cough and shortness of breath.   Cardiovascular: Negative for chest pain, palpitations and leg swelling.  Gastrointestinal: Negative for nausea, vomiting, abdominal pain, diarrhea, constipation, blood in stool and abdominal distention.  Genitourinary: Negative for dysuria, urgency, frequency, hematuria, flank pain, vaginal bleeding, vaginal discharge, difficulty urinating, vaginal pain and pelvic pain.  Musculoskeletal: Negative for arthralgias, gait problem and joint swelling.  Skin: Negative for rash.  Neurological: Negative for dizziness, syncope, speech difficulty, weakness, numbness and headaches.  Hematological: Negative for adenopathy.  Psychiatric/Behavioral: Positive for sleep disturbance. Negative for behavioral problems, dysphoric mood and agitation. The patient is nervous/anxious.        Objective:   Physical Exam  Constitutional: She is oriented to person, place, and time. She appears well-developed and well-nourished.  HENT:  Head: Normocephalic.  Right Ear: External ear normal.  Left Ear: External ear normal.  Mouth/Throat: Oropharynx is clear and moist.  Eyes: Conjunctivae and EOM  are normal. Pupils are equal, round, and reactive to light.  Neck: Normal range of motion. Neck supple. No thyromegaly present.  Cardiovascular: Normal rate, regular rhythm, normal heart sounds and intact distal pulses.   Pulmonary/Chest: Effort normal and breath sounds normal. No respiratory distress. She has no wheezes. She has no rales.  Abdominal: Soft. Bowel sounds are normal. She exhibits no mass. There is no tenderness.  Musculoskeletal: Normal range of motion.  Lymphadenopathy:    She has no cervical adenopathy.  Neurological: She is alert and oriented to person, place, and time.  Skin: Skin is warm and dry. No rash noted.  Psychiatric: She has a normal mood and affect. Her behavior is normal.          Assessment & Plan:   Status post resection of poorly differentiated stage IA lung cancer. Hepatic lesion.  We'll proceed with abdominal MRI  Tobacco use.  Prescription for Chantix, dispensed Insomnia.  Trial trazodone  Recheck 2 months

## 2014-01-14 NOTE — Patient Instructions (Addendum)
Smoking tobacco is very bad for your health. You should stop smoking immediately.  Abdominal MRI as discussed  Return in 2 months for followup

## 2014-01-14 NOTE — Progress Notes (Signed)
Pre visit review using our clinic review tool, if applicable. No additional management support is needed unless otherwise documented below in the visit note. 

## 2014-01-20 ENCOUNTER — Telehealth: Payer: Self-pay | Admitting: Internal Medicine

## 2014-01-20 DIAGNOSIS — R0600 Dyspnea, unspecified: Secondary | ICD-10-CM | POA: Insufficient documentation

## 2014-01-20 MED ORDER — ZOLPIDEM TARTRATE 5 MG PO TABS: 5.0000 mg | ORAL_TABLET | Freq: Every evening | ORAL | Status: DC | PRN

## 2014-01-20 NOTE — Telephone Encounter (Signed)
Pt states she is currently on rx traZODone (DESYREL) 50 MG tablet, pt states it is not working for her, pt states she increased to 2 pills at night but she is still having trouble sleeping, wants to know if something else. Send to [facility].

## 2014-01-20 NOTE — Assessment & Plan Note (Signed)
#  Left upper lobe lung cancer   - removed  - follow with Dr Servando Snare for surveillance

## 2014-01-20 NOTE — Assessment & Plan Note (Signed)
#  Left chest wall numbness  - likely due to recent surgery  - discuss with Dr Servando Snare about using neurontin

## 2014-01-20 NOTE — Telephone Encounter (Signed)
Please see message and advise 

## 2014-01-20 NOTE — Assessment & Plan Note (Signed)
#  Shortness of breath  - do full PFT  Possible post op deconditioning, Body mass index is 41.97 kg/(m^2). and lobectomy contributing to dysppnea  - follow results with NP Tammy

## 2014-01-20 NOTE — Telephone Encounter (Signed)
Spoke to pt, told her new Rx called into pharmacy for Ambien 5 mg one tablet by mouth at bedtime as needed to try for sleep. Pt verbalized understanding.

## 2014-01-20 NOTE — Telephone Encounter (Signed)
Please call in a new prescription for generic Ambien 5 mg, #30, one at bedtime as needed

## 2014-01-20 NOTE — Assessment & Plan Note (Signed)
#  Hepatic lesion  - left lobe of liver seen dec 2014 and stable as of march 2015  - please addess with Nyoka Cowden, MD but I will send message to him about this to get MRI

## 2014-01-21 ENCOUNTER — Ambulatory Visit
Admission: RE | Admit: 2014-01-21 | Discharge: 2014-01-21 | Disposition: A | Discharge status: Home / Self Care | Payer: BC Managed Care – PPO | Source: Ambulatory Visit | Attending: Internal Medicine | Admitting: Internal Medicine

## 2014-01-21 DIAGNOSIS — K769 Liver disease, unspecified: Secondary | ICD-10-CM

## 2014-01-21 MED ORDER — GADOBENATE DIMEGLUMINE 529 MG/ML IV SOLN
20.0000 mL | Freq: Once | INTRAVENOUS | Status: AC | PRN
  Administered 2014-01-21: 20 mL via INTRAVENOUS

## 2014-01-23 ENCOUNTER — Encounter (INDEPENDENT_AMBULATORY_CARE_PROVIDER_SITE_OTHER): Payer: BC Managed Care – PPO

## 2014-02-10 ENCOUNTER — Ambulatory Visit (INDEPENDENT_AMBULATORY_CARE_PROVIDER_SITE_OTHER): Payer: BC Managed Care – PPO | Admitting: Internal Medicine

## 2014-02-10 ENCOUNTER — Ambulatory Visit (INDEPENDENT_AMBULATORY_CARE_PROVIDER_SITE_OTHER): Payer: BC Managed Care – PPO | Admitting: Adult Health

## 2014-02-10 ENCOUNTER — Encounter: Payer: Self-pay | Admitting: Adult Health

## 2014-02-10 ENCOUNTER — Telehealth: Payer: Self-pay | Admitting: Internal Medicine

## 2014-02-10 VITALS — BP 112/74 | HR 75 | Temp 98.6°F | Ht 61.0 in | Wt 224.0 lb

## 2014-02-10 DIAGNOSIS — C3490 Malignant neoplasm of unspecified part of unspecified bronchus or lung: Secondary | ICD-10-CM

## 2014-02-10 DIAGNOSIS — C3492 Malignant neoplasm of unspecified part of left bronchus or lung: Secondary | ICD-10-CM

## 2014-02-10 DIAGNOSIS — R0600 Dyspnea, unspecified: Secondary | ICD-10-CM

## 2014-02-10 DIAGNOSIS — R911 Solitary pulmonary nodule: Secondary | ICD-10-CM

## 2014-02-10 DIAGNOSIS — R0989 Other specified symptoms and signs involving the circulatory and respiratory systems: Secondary | ICD-10-CM

## 2014-02-10 DIAGNOSIS — Z23 Encounter for immunization: Secondary | ICD-10-CM

## 2014-02-10 DIAGNOSIS — R0609 Other forms of dyspnea: Secondary | ICD-10-CM

## 2014-02-10 LAB — PULMONARY FUNCTION TEST
DL/VA % pred: 113 %
DL/VA: 4.98 ml/min/mmHg/L
DLCO UNC: 17.92 ml/min/mmHg
DLCO unc % pred: 88 %
FEF 25-75 PRE: 1.83 L/s
FEF 25-75 Post: 3.59 L/sec
FEF2575-%CHANGE-POST: 96 %
FEF2575-%Pred-Post: 186 %
FEF2575-%Pred-Pre: 94 %
FEV1-%CHANGE-POST: 18 %
FEV1-%PRED-PRE: 92 %
FEV1-%Pred-Post: 109 %
FEV1-Post: 2.04 L
FEV1-Pre: 1.72 L
FEV1FVC-%CHANGE-POST: 3 %
FEV1FVC-%Pred-Pre: 102 %
FEV6-%CHANGE-POST: 14 %
FEV6-%PRED-POST: 105 %
FEV6-%PRED-PRE: 91 %
FEV6-PRE: 2.1 L
FEV6-Post: 2.4 L
FEV6FVC-%Change-Post: 0 %
FEV6FVC-%PRED-POST: 102 %
FEV6FVC-%Pred-Pre: 103 %
FVC-%Change-Post: 14 %
FVC-%PRED-POST: 101 %
FVC-%PRED-PRE: 88 %
FVC-POST: 2.42 L
FVC-PRE: 2.1 L
POST FEV6/FVC RATIO: 100 %
Post FEV1/FVC ratio: 84 %
Pre FEV1/FVC ratio: 82 %
Pre FEV6/FVC Ratio: 100 %
RV % pred: 87 %
RV: 1.57 L
TLC % PRED: 85 %
TLC: 3.94 L

## 2014-02-10 NOTE — Progress Notes (Signed)
PFT done today. 

## 2014-02-10 NOTE — Assessment & Plan Note (Addendum)
Suspect Dyspnea if multifactoral in nature with deconditioning. May have Reactive Airways vs Mild Asthma  PFT shows no significant airflow obstruction, no restrictive process. There is +BD response in setting of ongoing smoking this may be reflective of RAD .  She has minimal cough and no wheezing so will hold off on adding ICS at this time  For now will give SABA -ProAir for intermittent use. Advised this is rescue only inhaler.  Previous workup for dyspnea earlier this year with normal stress myoview and echo showing grade 1 diastolic dysfunction.  We discussed smoking cessation.  Previous CT showed emphysematous changes   Plan  Most important goal is to quit smoking.  May use ProAir HFA 2 puffs every 4hr as needed for wheezing or shortness of breath Advance activity as tolerated .  Follow up Dr. Chase Caller in 2 months and As needed   Flu shot today .

## 2014-02-10 NOTE — Telephone Encounter (Signed)
Pt states Chantix is causing her to feel depressed.  She would like something for depression. She doesn't want to stop taking Chantix.   [facility] [address] [facility]

## 2014-02-10 NOTE — Patient Instructions (Signed)
Most important goal is to quit smoking.  May use ProAir HFA 2 puffs every 4hr as needed for wheezing or shortness of breath Advance activity as tolerated .  Follow up Dr. Chase Caller in 2 months and As needed

## 2014-02-10 NOTE — Telephone Encounter (Signed)
Please advise 

## 2014-02-10 NOTE — Telephone Encounter (Signed)
Notify patient that I would not want to start a antidepressant medication while she is taking Chantix; one option would be to discontinue Chantix and substitute Wellbutrin 150 twice daily , which would be a very effective medication for both depression and smoking cessation

## 2014-02-10 NOTE — Assessment & Plan Note (Signed)
follow up with Dr. Chase Caller in 2 months

## 2014-02-11 ENCOUNTER — Other Ambulatory Visit: Payer: Self-pay | Admitting: Cardiothoracic Surgery

## 2014-02-11 DIAGNOSIS — C3490 Malignant neoplasm of unspecified part of unspecified bronchus or lung: Secondary | ICD-10-CM

## 2014-02-11 MED ORDER — BUPROPION HCL ER (SR) 150 MG PO TB12: 150.0000 mg | ORAL_TABLET | Freq: Two times a day (BID) | ORAL | Status: DC

## 2014-02-11 NOTE — Telephone Encounter (Signed)
Spoke to pt, told her Dr. Burnice Logan said he would not want to start a antidepressant medication while taking Chantix. He said an option would be stop Chantix and substitute Wellbutrin 150 mg twice a day which would be a very effective medication for both depression and smoking cessation. Pt verbalized understanding and would like to try Wellbutrin. Told pt okay will send Rx to pharmacy for Wellbutrin 150 mg twice a day and stop Chantix. Pt verbalized understanding. Rx sent.

## 2014-02-12 ENCOUNTER — Ambulatory Visit (INDEPENDENT_AMBULATORY_CARE_PROVIDER_SITE_OTHER): Payer: BC Managed Care – PPO | Admitting: Cardiothoracic Surgery

## 2014-02-12 ENCOUNTER — Encounter: Payer: Self-pay | Admitting: Cardiothoracic Surgery

## 2014-02-12 ENCOUNTER — Ambulatory Visit
Admission: RE | Admit: 2014-02-12 | Discharge: 2014-02-12 | Disposition: A | Discharge status: Home / Self Care | Payer: BC Managed Care – PPO | Source: Ambulatory Visit | Attending: Cardiothoracic Surgery | Admitting: Cardiothoracic Surgery

## 2014-02-12 VITALS — BP 114/74 | HR 86 | Ht 61.0 in | Wt 224.0 lb

## 2014-02-12 DIAGNOSIS — C3410 Malignant neoplasm of upper lobe, unspecified bronchus or lung: Secondary | ICD-10-CM

## 2014-02-12 DIAGNOSIS — C3490 Malignant neoplasm of unspecified part of unspecified bronchus or lung: Secondary | ICD-10-CM

## 2014-02-12 NOTE — Progress Notes (Signed)
[address]       [place] [address]             [phone]      Lenyx E Cobin [place] Medical Record #[id number] Date of Birth: [birth year]  Referring: Brand Males, MD Primary Care: Nyoka Cowden, MD  Chief Complaint:   POST OP FOLLOW UP 10/28/2013  DATE OF DISCHARGE:  OPERATIVE REPORT  POSTOPERATIVE DIAGNOSIS: Left upper lobe lung lesion.  POSTOPERATIVE DIAGNOSIS: Left upper lobe lung lesion, probable  carcinoma, atypia on frozen section.  PROCEDURE PERFORMED: Bronchoscopy, left video-assisted thoracoscopy,  minithoracotomy, wedge resection of left upper lobe, and lymph node  dissection.   Adenocarcinoma of lung, stage 1   Primary site: Lung (Left)   Staging method: AJCC 7th Edition   Pathologic: Stage IA (T1a, N0, cM0) signed by [name], MD on 10/29/2013  5:11 PM   Summary: Stage IA (T1a, N0, cM0)  History of Present Illness:     Patient continues to do well postoperatively. She is increasing her physical activity appropriately. She notes that she continues to smoke 6 cigarettes a day . She has tried Chantix, but this made her depressed she was started on Wellbutrin yesterday.  She does note coughing at night after she has been laying down, I discussed with her this is likely reflux and she should keep her head of her bed elevated. With her in last May and she has had slow emptying of her esophagus which may increase the risk of reflux.   Past Medical History  Diagnosis Date  .   07/20/2006  . BACK PAIN, LOW 07/20/2006  . BREAST LUMP 09/05/2007    "benign"  . CONSTIPATION, CHRONIC 08/10/2009  . DIVERTICULOSIS, COLON 08/10/2009  . Hemoptysis 08/27/2008  . HYPERLIPIDEMIA 05/08/2007  . HYPERLIPIDEMIA     "not anymore" (08/26/2013)  . INSOMNIA NOS 07/20/2006  . 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 OBESITY, NOS 07/20/2006  . Pulmonary nodule   . Heart murmur   . GERD (gastroesophageal reflux disease)   . Pneumonia 2014    "once"  (08/26/2013)  . MIGRAINE, COMMON 05/08/2007    "haven't had one in ~ 6 months" (08/26/2013)  . Lung cancer   . Chest pain   . Dyspnea on exertion   . Tobacco abuse   . COPD (chronic obstructive pulmonary disease)   . Leaky heart valve   . Arthritis   . Adenocarcinoma of lung, stage 1 08/26/2013     History  Smoking status  . Current Some Day Smoker -- 0.25 packs/day for 44 years  . Types: Cigarettes  Smokeless tobacco  . Never Used    Comment: smoking 10cigs/week    History  Alcohol Use No    Comment: occasional wine     No Known Allergies  Current Outpatient Prescriptions  Medication Sig Dispense Refill  . betamethasone valerate lotion (VALISONE) 0.1 % APPLY 1 APPLICATION TOPICALLY 2 (TWO) TIMES DAILY.  60 mL  2  . buPROPion (WELLBUTRIN SR) 150 MG 12 hr tablet Take 1 tablet (150 mg total) by mouth 2 (two) times daily.  180 tablet  1  . ibuprofen (ADVIL,MOTRIN) 800 MG tablet Take 2 tablets by mouth as needed.      . traZODone (DESYREL) 50 MG tablet Take 0.5-1 tablets (25-50 mg total) by mouth at bedtime as needed for sleep.  30 tablet  3  . zolpidem (AMBIEN) 5 MG tablet Take 1 tablet (5 mg total) by mouth at  bedtime as needed for sleep.  30 tablet  0   No current facility-administered medications for this visit.       Physical Exam: BP 114/74  Pulse 86  Ht 5\' 1"  (1.549 m)  Wt 224 lb (101.606 kg)  BMI 42.35 kg/m2  SpO2 98%  General appearance: alert and cooperative Neurologic: intact Heart: regular rate and rhythm, S1, S2 normal, no murmur, click, rub or gallop Lungs: clear to auscultation bilaterally Abdomen: soft, non-tender; bowel sounds normal; no masses,  no organomegaly Extremities: extremities normal, atraumatic, no cyanosis or edema and Homans sign is negative, no sign of DVT Wound: The left chest incisions are well-healed without evidence of infection   Diagnostic Studies & Laboratory data:     Recent Radiology Findings:   Dg Chest 2 View  02/12/2014    CLINICAL DATA:  History of lung cancer with partial left lobectomy  EXAM: CHEST  2 VIEW  COMPARISON:  11/14/2013  FINDINGS: Cardiac shadow is stable. Postoperative changes are again noted in the left apex. No focal infiltrate or sizable effusion is seen. No bony abnormality is noted.  IMPRESSION: Postoperative change without acute abnormality.   Electronically Signed   By: Inez Catalina M.D.   On: 02/12/2014 14:05      Recent Lab Findings: Lab Results  Component Value Date   WBC 7.6 10/30/2013   HGB 11.5* 10/30/2013   HCT 36.9 10/30/2013   PLT 234 10/30/2013   GLUCOSE 111* 10/30/2013   CHOL 214* 06/07/2011   TRIG 63.0 06/07/2011   HDL 63.70 06/07/2011   LDLDIRECT 130.4 06/07/2011   LDLCALC  Value: 102        Total Cholesterol/HDL:CHD Risk Coronary Heart Disease Risk Table                     Men   Women  1/2 Average Risk   3.4   3.3  Average Risk       5.0   4.4  2 X Average Risk   9.6   7.1  3 X Average Risk  23.4   11.0        Use the calculated Patient Ratio above and the CHD Risk Table to determine the patient's CHD Risk.        ATP III CLASSIFICATION (LDL):  <100     mg/dL   Optimal  100-129  mg/dL   Near or Above                    Optimal  130-159  mg/dL   Borderline  160-189  mg/dL   High  >190     mg/dL   Very High* 05/13/2009   ALT 23 10/30/2013   AST 20 10/30/2013   NA 139 10/30/2013   K 4.5 10/30/2013   CL 103 10/30/2013   CREATININE 0.60 10/30/2013   BUN 10 10/30/2013   CO2 28 10/30/2013   TSH 1.18 06/07/2011   INR 0.94 10/24/2013   HGBA1C  Value: 5.8 (NOTE) The ADA recommends the following therapeutic goal for glycemic control related to Hgb A1c measurement: Goal of therapy: <6.5 Hgb A1c  Reference: American Diabetes Association: Clinical Practice Recommendations 2010, Diabetes Care, 2010, 33: (Suppl  1). 05/12/2009      Assessment / Plan:    Improving physical status following wedge resection and node sampling for stage IA adenocarcinoma of the left lung. Again discussed with the  patient the need to stay off cigarettes. She will return  in 3 months with a followup ct of the chest We will discontinue the home oxygen.    [name] MD      [address] [place] [address] Office [phone]   Beeper [phone]  02/12/2014 2:36 PM

## 2014-02-13 ENCOUNTER — Ambulatory Visit: Payer: BC Managed Care – PPO | Admitting: Cardiothoracic Surgery

## 2014-02-13 NOTE — Progress Notes (Signed)
Subjective:    Patient ID: Lauren Campos, female    DOB: 26-Jun-1955, 58 y.o.   MRN: [id number]  HPI    OV 01/13/2014  Chief Complaint  Patient presents with  . Follow-up    Pt c/o SOB when standing for long periods of time and with exertion. Pt c/o dry cough and an "odd feeling in throat". Pt denies CP/tightness. Pt states since her lung surgery she has had numbness under left breast.   Followup visit for left upper lobe lung nodule and a smoker age 15   She underwent wedge resection of the left upper lobe and lymph node dissection. Diagnosis stage IA adenocarcinoma of the lung. Operative day June 2015. She is doing well overall. Main issues that she is having some postoperative dyspnea on exertion that is relieved by rest. Class II in severity. No associated chest pain diaphoresis, edema, orthopnea, paroxysmal nocturnal dyspnea. There is no associated cough  the new issue is that she is having mild left inframammary numbness and paresthesias occurred afterr surgery and she feels is related to chest tube removal   Also she is concerned about left hepatic lesion seen on CT scan of the chest December 2014 but did not light up on PET scan and is stable on followup imaging in March 2015. Radiology has recommended MRI. Patient does be that cardiothoracic surgery has deferred this to primary care physician   Smoking  She continues to smoke  02/10/14 Follow up  Pt returns for 1 month follow up to discuss PFT results.  Was seen for pulmonary consult last month for dyspnea .  She underwent wedge resection of the left upper lobe and lymph node dissection. Diagnosis stage IA adenocarcinoma of the lung. Operative day June 2015. She continues to smoke , advised on smoking cessation  Has dry cough that waxes and wanes. Worse at night at times. Does get winded at times but seems to be getting better.  Underwent PFT today with FEV1 1.72 L , 92% , +BD response, FVC 88%, ratio 82 , Diffusing  capacity nml 88%.  No discolored mucus, fever, hemoptysis, chest pain , wheezing or overt reflux   Review of Systems  Constitutional: Negative for fever and unexpected weight change.  HENT: Negative for congestion, dental problem, ear pain, nosebleeds, postnasal drip, rhinorrhea, sinus pressure, sneezing, sore throat and trouble swallowing.   Eyes: Negative for redness and itching.  Respiratory: Positive for shortness of breath. Negative for cough, chest tightness and wheezing.   Cardiovascular: Negative for palpitations and leg swelling.  Gastrointestinal: Negative for nausea and vomiting.  Genitourinary: Negative for dysuria.  Musculoskeletal: Negative for joint swelling.  Skin: Negative for rash.  Neurological: Negative for headaches.  Hematological: Does not bruise/bleed easily.  Psychiatric/Behavioral: Negative for dysphoric mood. The patient is not nervous/anxious.        Objective:   Physical Exam  Vitals reviewed. Constitutional: She is oriented to person, place, and time. She appears well-developed and well-nourished. No distress.  Body mass index is 41.97 kg/(m^2).   HENT:  Head: Normocephalic and atraumatic.  Right Ear: External ear normal.  Left Ear: External ear normal.  Mouth/Throat: Oropharynx is clear and moist. No oropharyngeal exudate.  Eyes: Conjunctivae and EOM are normal. Pupils are equal, round, and reactive to light. Right eye exhibits no discharge. Left eye exhibits no discharge. No scleral icterus.  Neck: Normal range of motion. Neck supple. No JVD present. No tracheal deviation present. No thyromegaly present.  Cardiovascular: Normal rate,  regular rhythm, normal heart sounds and intact distal pulses.  Exam reveals no gallop and no friction rub.   No murmur heard. Pulmonary/Chest: Effort normal and breath sounds normal. No respiratory distress. She has no wheezes. She has no rales. She exhibits no tenderness.  Scar posteriorly left side - keloid +    Abdominal: Soft. Bowel sounds are normal. She exhibits no distension and no mass. There is no tenderness. There is no rebound and no guarding.  Musculoskeletal: Normal range of motion. She exhibits no edema and no tenderness.  Lymphadenopathy:    She has no cervical adenopathy.  Neurological: She is alert and oriented to person, place, and time. She has normal reflexes. No cranial nerve deficit. She exhibits normal muscle tone. Coordination normal.  Skin: Skin is warm and dry. No rash noted. She is not diaphoretic. No erythema. No pallor.  Keloid in central chest  Psychiatric: She has a normal mood and affect. Her behavior is normal. Judgment and thought content normal.  Flat affect          Assessment & Plan:

## 2014-02-17 ENCOUNTER — Telehealth: Payer: Self-pay | Admitting: Internal Medicine

## 2014-02-17 DIAGNOSIS — Z0271 Encounter for disability determination: Secondary | ICD-10-CM

## 2014-02-17 MED ORDER — ALBUTEROL SULFATE HFA 108 (90 BASE) MCG/ACT IN AERS: 2.0000 | INHALATION_SPRAY | Freq: Four times a day (QID) | RESPIRATORY_TRACT | Status: DC | PRN

## 2014-02-17 NOTE — Telephone Encounter (Signed)
Called and spoke with pt and she stated that she needs the proair hfa sent to her pharmacy.  She discussed this at her last ov with TP.  This has been done and pt is aware. Nothing further is needed.

## 2014-03-12 ENCOUNTER — Telehealth: Payer: Self-pay | Admitting: Internal Medicine

## 2014-03-12 NOTE — Telephone Encounter (Signed)
Noted  

## 2014-03-12 NOTE — Telephone Encounter (Signed)
Patient Information:  Caller Name: Lauren Campos  Phone: [phone]  Patient: Lauren Campos, Lauren Campos  Gender: Female  DOB: [birth year]  Age: 58 Years  PCP: Bluford Kaufmann (Family Practice > 29yrs old)  Office Follow Up:  Does the office need to follow up with this patient?: No  Instructions For The Office: N/A  RN Note:  Stopped taking Wellbutrin because it made her feel depressed. Smoking decreased to 3 cigarettes daily. Admits to some depression but it is not her main symptom. Grandchild noted sleep apnea recently. Sleep hygiene measures reviewed including no TV, computer, or texting within 1 hour before bed, try hot soak in tub or shower, try reading before sleep, try herbal tea for sleep, listen to music specific for sleep.  Advised to see MD within 72 hours per EMR note 01/14/14 directing follow up in 2 months and need for medical evaluation per nursing judgement for chronic daytime fatigue related to sleeplessness per Sleep Disorders.  Agreed to be seen.   Symptoms  Reason For Call & Symptoms: Unable to sleep,  Nither Ambien or Trazadone are effective. Usually takes the Rx right before lying down.  Gets about 3 hours of sleep.  Insomnia long term problem for past 20 years.  Reviewed Health History In EMR: Yes  Reviewed Medications In EMR: Yes  Reviewed Allergies In EMR: Yes  Reviewed Surgeries / Procedures: Yes  Date of Onset of Symptoms: Unknown  Treatments Tried: Ambien, Trazadone  Treatments Tried Worked: No  Guideline(s) Used:  Depression  No Protocol Available - Sick Adult  Disposition Per Guideline:   See Within 3 Days in Office  Reason For Disposition Reached:   Nursing judgment  Advice Given:  Call Back If:  You become worse.  Patient Will Follow Care Advice:  YES  Appointment Scheduled:  03/13/2014 11:00:00 Appointment Scheduled Provider:  Bluford Kaufmann (Family Practice > 78yrs old)

## 2014-03-13 ENCOUNTER — Ambulatory Visit (INDEPENDENT_AMBULATORY_CARE_PROVIDER_SITE_OTHER): Payer: BC Managed Care – PPO | Admitting: Internal Medicine

## 2014-03-13 ENCOUNTER — Encounter: Payer: Self-pay | Admitting: Internal Medicine

## 2014-03-13 VITALS — BP 102/60 | HR 84 | Temp 98.2°F | Resp 20 | Ht 61.0 in | Wt 227.0 lb

## 2014-03-13 DIAGNOSIS — G4700 Insomnia, unspecified: Secondary | ICD-10-CM

## 2014-03-13 DIAGNOSIS — M545 Low back pain, unspecified: Secondary | ICD-10-CM

## 2014-03-13 MED ORDER — LORAZEPAM 1 MG PO TABS
1.0000 mg | ORAL_TABLET | Freq: Every day | ORAL | Status: DC
Start: 2014-03-13 — End: 2014-05-13

## 2014-03-13 NOTE — Patient Instructions (Signed)
Trial melatonin  Sleep hygiene measures as discussed  Consider behavioral health referral

## 2014-03-13 NOTE — Progress Notes (Signed)
Pre visit review using our clinic review tool, if applicable. No additional management support is needed unless otherwise documented below in the visit note. 

## 2014-03-13 NOTE — Progress Notes (Signed)
Subjective:    Patient ID: Lauren Campos, female    DOB: 1955/07/07, 58 y.o.   MRN: [id number]  HPI 58 year old patient who is seen today with a chief complaint of persistent insomnia.  This has been a chronic problem, at least for 3 years.  She has difficulties both with sleep maintenance and sleep onset.  Low back pain that is bothersome through the night is a contributing factor.  She has been given a trial of trazodone and Ambien and even in combination these medicines are not very helpful.  She has been helped by amitriptyline in the past, but this was very constipating and also associated with weight gain.  More recently, she has been on Wellbutrin for smoking cessation, but this has been self discontinued.  She continues to smoke about 6 cigarettes daily.  Past Medical History  Diagnosis Date  .   07/20/2006  . BACK PAIN, LOW 07/20/2006  . BREAST LUMP 09/05/2007    "benign"  . CONSTIPATION, CHRONIC 08/10/2009  . DIVERTICULOSIS, COLON 08/10/2009  . Hemoptysis 08/27/2008  . HYPERLIPIDEMIA 05/08/2007  . HYPERLIPIDEMIA     "not anymore" (08/26/2013)  . INSOMNIA NOS 07/20/2006  . 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 OBESITY, NOS 07/20/2006  . Pulmonary nodule   . Heart murmur   . GERD (gastroesophageal reflux disease)   . Pneumonia 2014    "once" (08/26/2013)  . MIGRAINE, COMMON 05/08/2007    "haven't had one in ~ 6 months" (08/26/2013)  . Lung cancer   . Chest pain   . Dyspnea on exertion   . Tobacco abuse   . COPD (chronic obstructive pulmonary disease)   . Leaky heart valve   . Arthritis   . Adenocarcinoma of lung, stage 1 08/26/2013    History   Social History  . Marital Status: Divorced    Spouse Name: N/A    Number of Children: N/A  . Years of Education: N/A   Occupational History  . Not on file.   Social History Main Topics  . Smoking status: Current Some Day Smoker -- 0.25 packs/day for 44 years    Types: Cigarettes  . Smokeless tobacco: Never Used     Comment: smoking  10cigs/week  . Alcohol Use: No     Comment: occasional wine  . Drug Use: No  . Sexual Activity: Not Currently   Other Topics Concern  . Not on file   Social History Narrative  . No narrative on file    Past Surgical History  Procedure Laterality Date  . Laparoscopic gastric banding  ~ 2006  . Colonoscopy    . Bronchoscopy  08/26/2013  . Abdominal hysterectomy  1990's  . Tubal ligation  1980's  . Shoulder arthroscopy w/ rotator cuff repair Left 2014  . Wrist fracture surgery Left ~ 2010  . Video bronchoscopy N/A 08/26/2013    Procedure: VIDEO BRONCHOSCOPY;  Surgeon: Melrose Nakayama, MD;  Location: [place];  Service: Thoracic;  Laterality: N/A;  . Video bronchoscopy N/A 10/28/2013    Procedure: VIDEO BRONCHOSCOPY;  Surgeon: [name], MD;  Location: [facility] OR;  Service: Thoracic;  Laterality: N/A;  . Video assisted thoracoscopy (vats)/wedge resection Left 10/28/2013    Procedure: VIDEO ASSISTED THORACOSCOPY (VATS) lung left,  mini left thorocotomy, wedge resection left upper lobe nodule, lymph node dissection.;  Surgeon: [name], MD;  Location: [place];  Service: Thoracic;  Laterality: Left;    Family History  Problem Relation Age of Onset  . Cancer  Mother   . Diabetes Mother   . Heart disease Mother   . Cancer Father   . Diabetes Sister   . Heart attack Brother   . Heart disease Brother     No Known Allergies  Current Outpatient Prescriptions on File Prior to Visit  Medication Sig Dispense Refill  . albuterol (PROVENTIL HFA;VENTOLIN HFA) 108 (90 BASE) MCG/ACT inhaler Inhale 2 puffs into the lungs every 6 (six) hours as needed for wheezing or shortness of breath.  1 Inhaler  6  . betamethasone valerate lotion (VALISONE) 0.1 % APPLY 1 APPLICATION TOPICALLY 2 (TWO) TIMES DAILY.  60 mL  2  . buPROPion (WELLBUTRIN SR) 150 MG 12 hr tablet Take 1 tablet (150 mg total) by mouth 2 (two) times daily.  180 tablet  1  . ibuprofen (ADVIL,MOTRIN) 800 MG tablet Take 2 tablets  by mouth as needed.      . traZODone (DESYREL) 50 MG tablet Take 0.5-1 tablets (25-50 mg total) by mouth at bedtime as needed for sleep.  30 tablet  3  . zolpidem (AMBIEN) 5 MG tablet Take 1 tablet (5 mg total) by mouth at bedtime as needed for sleep.  30 tablet  0   No current facility-administered medications on file prior to visit.    BP 102/60  Pulse 84  Temp(Src) 98.2 F (36.8 C) (Oral)  Resp 20  Ht 5\' 1"  (1.549 m)  Wt 227 lb (102.967 kg)  BMI 42.91 kg/m2  SpO2 97%      Review of Systems  Constitutional: Negative.   HENT: Negative for congestion, dental problem, hearing loss, rhinorrhea, sinus pressure, sore throat and tinnitus.   Eyes: Negative for pain, discharge and visual disturbance.  Respiratory: Negative for cough and shortness of breath.   Cardiovascular: Negative for chest pain, palpitations and leg swelling.  Gastrointestinal: Negative for nausea, vomiting, abdominal pain, diarrhea, constipation, blood in stool and abdominal distention.  Genitourinary: Negative for dysuria, urgency, frequency, hematuria, flank pain, vaginal bleeding, vaginal discharge, difficulty urinating, vaginal pain and pelvic pain.  Musculoskeletal: Negative for arthralgias, gait problem and joint swelling.  Skin: Negative for rash.  Neurological: Negative for dizziness, syncope, speech difficulty, weakness, numbness and headaches.  Hematological: Negative for adenopathy.  Psychiatric/Behavioral: Positive for sleep disturbance. Negative for behavioral problems, dysphoric mood and agitation. The patient is not nervous/anxious.        Objective:   Physical Exam  Constitutional: She appears well-developed and well-nourished. No distress.  Overweight Blood pressure low normal No distress  Psychiatric: She has a normal mood and affect. Her behavior is normal. Judgment and thought content normal.          Assessment & Plan:   Chronic insomnia.  Will give a trial of melatonin.  Patient  is aware that this medication may take some time to be effective.  We'll also give a trial of lorazepam at bedtime at a dose of 1 mg.  Sleep hygiene issues again discussed at length and information was provided. Patient will consider referral for CBT and referral sources, dispensed

## 2014-03-14 ENCOUNTER — Telehealth: Payer: Self-pay | Admitting: Internal Medicine

## 2014-03-14 NOTE — Telephone Encounter (Signed)
emmi emailed °

## 2014-03-25 ENCOUNTER — Other Ambulatory Visit: Payer: Self-pay

## 2014-03-25 DIAGNOSIS — C3492 Malignant neoplasm of unspecified part of left bronchus or lung: Secondary | ICD-10-CM

## 2014-03-25 DIAGNOSIS — C3431 Malignant neoplasm of lower lobe, right bronchus or lung: Secondary | ICD-10-CM

## 2014-04-08 ENCOUNTER — Telehealth: Payer: Self-pay | Admitting: Internal Medicine

## 2014-04-08 ENCOUNTER — Encounter: Payer: Self-pay | Admitting: Internal Medicine

## 2014-04-08 ENCOUNTER — Ambulatory Visit (INDEPENDENT_AMBULATORY_CARE_PROVIDER_SITE_OTHER): Payer: BC Managed Care – PPO | Admitting: Internal Medicine

## 2014-04-08 ENCOUNTER — Encounter: Payer: Self-pay | Admitting: *Deleted

## 2014-04-08 ENCOUNTER — Other Ambulatory Visit: Payer: Self-pay | Admitting: Internal Medicine

## 2014-04-08 DIAGNOSIS — B9789 Other viral agents as the cause of diseases classified elsewhere: Secondary | ICD-10-CM

## 2014-04-08 DIAGNOSIS — J069 Acute upper respiratory infection, unspecified: Secondary | ICD-10-CM

## 2014-04-08 DIAGNOSIS — Z23 Encounter for immunization: Secondary | ICD-10-CM

## 2014-04-08 DIAGNOSIS — C3490 Malignant neoplasm of unspecified part of unspecified bronchus or lung: Secondary | ICD-10-CM

## 2014-04-08 DIAGNOSIS — R42 Dizziness and giddiness: Secondary | ICD-10-CM

## 2014-04-08 MED ORDER — HYDROCODONE-HOMATROPINE 5-1.5 MG/5ML PO SYRP: 5.0000 mL | ORAL_SOLUTION | Freq: Four times a day (QID) | ORAL | Status: DC | PRN

## 2014-04-08 NOTE — Progress Notes (Signed)
Pre visit review using our clinic review tool, if applicable. No additional management support is needed unless otherwise documented below in the visit note. 

## 2014-04-08 NOTE — Patient Instructions (Signed)
Acute bronchitis symptoms for less than 10 days are generally not helped by antibiotics.  Take over-the-counter expectorants and cough medications such as  Mucinex DM.  Call if there is no improvement in 5 to 7 days or if  you develop worsening cough, fever, or new symptoms, such as shortness of breath or chest pain.  Pulmonary follow-up as scheduled  Call or return to clinic prn if these symptoms worsen or fail to improve as anticipated.

## 2014-04-08 NOTE — Telephone Encounter (Signed)
lmtcb for pt.  

## 2014-04-08 NOTE — Progress Notes (Signed)
Subjective:    Patient ID: Lauren Campos, female    DOB: [birth year], 58 y.o.   MRN: [id number]  HPI 58 year old patient who is seen today with a chief complaint of dizziness and blurred vision that has been intermittent over the past week and a half.  Symptoms have occurred on 5 occasions, lasting 3-4 minutes.  Also during this time she has developed a cough, which more recently has been productive of scanty amount of blood.  She does have a history of stage IA lung cancer status post resection in June of this year.  She is scheduled for pulmonary follow-up in 2 days.  She did have an eye exam about 6 months ago.  She does have a history of common migraines. Cough is interfering with sleep. She continues to smoke but has tapered somewhat She continues to have issues with insomnia but has improved with lorazepam.  Has not given melatonin a trial.  Past Medical History  Diagnosis Date  .   07/20/2006  . BACK PAIN, LOW 07/20/2006  . BREAST LUMP 09/05/2007    "benign"  . CONSTIPATION, CHRONIC 08/10/2009  . DIVERTICULOSIS, COLON 08/10/2009  . Hemoptysis 08/27/2008  . HYPERLIPIDEMIA 05/08/2007  . HYPERLIPIDEMIA     "not anymore" (08/26/2013)  . INSOMNIA NOS 07/20/2006  . 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 OBESITY, NOS 07/20/2006  . Pulmonary nodule   . Heart murmur   . GERD (gastroesophageal reflux disease)   . Pneumonia 2014    "once" (08/26/2013)  . MIGRAINE, COMMON 05/08/2007    "haven't had one in ~ 6 months" (08/26/2013)  . Lung cancer   . Chest pain   . Dyspnea on exertion   . Tobacco abuse   . COPD (chronic obstructive pulmonary disease)   . Leaky heart valve   . Arthritis   . Adenocarcinoma of lung, stage 1 08/26/2013    History   Social History  . Marital Status: Divorced    Spouse Name: N/A    Number of Children: N/A  . Years of Education: N/A   Occupational History  . Not on file.   Social History Main Topics  . Smoking status: Current Some Day Smoker -- 0.25 packs/day for 44  years    Types: Cigarettes  . Smokeless tobacco: Never Used     Comment: smoking 10cigs/week  . Alcohol Use: No     Comment: occasional wine  . Drug Use: No  . Sexual Activity: Not Currently   Other Topics Concern  . Not on file   Social History Narrative    Past Surgical History  Procedure Laterality Date  . Laparoscopic gastric banding  ~ 2006  . Colonoscopy    . Bronchoscopy  08/26/2013  . Abdominal hysterectomy  1990's  . Tubal ligation  1980's  . Shoulder arthroscopy w/ rotator cuff repair Left 2014  . Wrist fracture surgery Left ~ 2010  . Video bronchoscopy N/A 08/26/2013    Procedure: VIDEO BRONCHOSCOPY;  Surgeon: Melrose Nakayama, MD;  Location: [place];  Service: Thoracic;  Laterality: N/A;  . Video bronchoscopy N/A 10/28/2013    Procedure: VIDEO BRONCHOSCOPY;  Surgeon: [name], MD;  Location[facility] OR;  Service: Thoracic;  Laterality: N/A;  . Video assisted thoracoscopy (vats)/wedge resection Left 10/28/2013    Procedure: VIDEO ASSISTED THORACOSCOPY (VATS) lung left,  mini left thorocotomy, wedge resection left upper lobe nodule, lymph node dissection.;  Surgeon: [name], MD;  Location: [place];  Service: Thoracic;  Laterality: Left;  Family History  Problem Relation Age of Onset  . Cancer Mother   . Diabetes Mother   . Heart disease Mother   . Cancer Father   . Diabetes Sister   . Heart attack Brother   . Heart disease Brother     No Known Allergies  Current Outpatient Prescriptions on File Prior to Visit  Medication Sig Dispense Refill  . albuterol (PROVENTIL HFA;VENTOLIN HFA) 108 (90 BASE) MCG/ACT inhaler Inhale 2 puffs into the lungs every 6 (six) hours as needed for wheezing or shortness of breath. 1 Inhaler 6  . betamethasone valerate lotion (VALISONE) 0.1 % APPLY 1 APPLICATION TOPICALLY 2 (TWO) TIMES DAILY. 60 mL 2  . ibuprofen (ADVIL,MOTRIN) 800 MG tablet Take 2 tablets by mouth as needed.    Marland Kitchen LORazepam (ATIVAN) 1 MG tablet Take 1 tablet  (1 mg total) by mouth at bedtime. 60 tablet 1   No current facility-administered medications on file prior to visit.    BP 110/70 mmHg  Pulse 76  Temp(Src) 98.1 F (36.7 C) (Oral)  Resp 20  Ht 5\' 1"  (1.549 m)  Wt 218 lb (98.884 kg)  BMI 41.21 kg/m2      Review of Systems  Constitutional: Negative.   HENT: Negative for congestion, dental problem, hearing loss, rhinorrhea, sinus pressure, sore throat and tinnitus.   Eyes: Positive for visual disturbance. Negative for pain and discharge.  Respiratory: Positive for cough. Negative for shortness of breath.   Cardiovascular: Negative for chest pain, palpitations and leg swelling.  Gastrointestinal: Negative for nausea, vomiting, abdominal pain, diarrhea, constipation, blood in stool and abdominal distention.  Genitourinary: Negative for dysuria, urgency, frequency, hematuria, flank pain, vaginal bleeding, vaginal discharge, difficulty urinating, vaginal pain and pelvic pain.  Musculoskeletal: Negative for joint swelling, arthralgias and gait problem.  Skin: Negative for rash.  Neurological: Positive for dizziness. Negative for syncope, speech difficulty, weakness, numbness and headaches.  Hematological: Negative for adenopathy.  Psychiatric/Behavioral: Negative for behavioral problems, dysphoric mood and agitation. The patient is not nervous/anxious.        Objective:   Physical Exam  Constitutional: She is oriented to person, place, and time. She appears well-developed and well-nourished.  HENT:  Head: Normocephalic.  Right Ear: External ear normal.  Left Ear: External ear normal.  Mouth/Throat: Oropharynx is clear and moist.  Eyes: Conjunctivae and EOM are normal. Pupils are equal, round, and reactive to light.  Neck: Normal range of motion. Neck supple. No thyromegaly present.  Cardiovascular: Normal rate, regular rhythm, normal heart sounds and intact distal pulses.   Pulmonary/Chest: Effort normal and breath sounds normal.   Abdominal: Soft. Bowel sounds are normal. She exhibits no mass. There is no tenderness.  Musculoskeletal: Normal range of motion.  Lymphadenopathy:    She has no cervical adenopathy.  Neurological: She is alert and oriented to person, place, and time. No cranial nerve deficit. Coordination normal.  Skin: Skin is warm and dry. No rash noted.  Psychiatric: She has a normal mood and affect. Her behavior is normal.          Assessment & Plan:   Episodic dizziness with visual disturbance.  Possible migraine variant.  Will observe at this time.  If symptoms fail to improve or worsen, will need to do imaging studies.  In view of her history of lung cancer.  Neuro exam nonfocal Cough, probably secondary to bronchitis.  Cough is an affair with sleep and refractory.  Total smoking cessation encouraged.  We'll treat symptomatically.  Pulmonary  follow-up in 2 days as scheduled

## 2014-04-09 NOTE — Telephone Encounter (Signed)
Called and spoke to pt. Pt c/o dizziness and lightheadedness for 3 weeks. Pt denies loosing consciousness,passing out, blacking out or falls. Pt stated she saw her PCP and was advised for pt to keep appt with MR on 11/19. Advised pt to keep appt too and if s/s worsen to seek emergency care. Pt verbalized understanding and denied any further questions or concerns at this time.

## 2014-04-09 NOTE — Telephone Encounter (Signed)
Pt returning call.Lauren Campos ° °

## 2014-04-10 ENCOUNTER — Encounter: Payer: Self-pay | Admitting: Internal Medicine

## 2014-04-10 ENCOUNTER — Ambulatory Visit (INDEPENDENT_AMBULATORY_CARE_PROVIDER_SITE_OTHER): Payer: BC Managed Care – PPO | Admitting: Internal Medicine

## 2014-04-10 VITALS — BP 122/80 | HR 89 | Ht 61.0 in | Wt 220.0 lb

## 2014-04-10 DIAGNOSIS — Z720 Tobacco use: Secondary | ICD-10-CM

## 2014-04-10 DIAGNOSIS — F17200 Nicotine dependence, unspecified, uncomplicated: Secondary | ICD-10-CM

## 2014-04-10 DIAGNOSIS — R42 Dizziness and giddiness: Secondary | ICD-10-CM

## 2014-04-10 DIAGNOSIS — J208 Acute bronchitis due to other specified organisms: Secondary | ICD-10-CM

## 2014-04-10 DIAGNOSIS — R042 Hemoptysis: Secondary | ICD-10-CM

## 2014-04-10 DIAGNOSIS — H538 Other visual disturbances: Secondary | ICD-10-CM

## 2014-04-10 MED ORDER — PREDNISONE 10 MG PO TABS: ORAL_TABLET | ORAL | Status: DC

## 2014-04-10 MED ORDER — LEVOFLOXACIN 500 MG PO TABS: 500.0000 mg | ORAL_TABLET | Freq: Every day | ORAL | Status: DC

## 2014-04-10 NOTE — Progress Notes (Signed)
Subjective:    Patient ID: Lauren Campos, female    DOB: 1955/08/02, 58 y.o.   MRN: [id number]  HPI  OV 01/13/2014  Chief Complaint  Patient presents with  . Follow-up    Pt c/o SOB when standing for long periods of time and with exertion. Pt c/o dry cough and an "odd feeling in throat". Pt denies CP/tightness. Pt states since her lung surgery she has had numbness under left breast.   Followup visit for left upper lobe lung nodule and a smoker age 23   She underwent wedge resection of the left upper lobe and lymph node dissection. Diagnosis stage IA adenocarcinoma of the lung. Operative day June 2015. She is doing well overall. Main issues that she is having some postoperative dyspnea on exertion that is relieved by rest. Class II in severity. No associated chest pain diaphoresis, edema, orthopnea, paroxysmal nocturnal dyspnea. There is no associated cough  the new issue is that she is having mild left inframammary numbness and paresthesias occurred afterr surgery and she feels is related to chest tube removal   Also she is concerned about left hepatic lesion seen on CT scan of the chest December 2014 but did not light up on PET scan and is stable on followup imaging in March 2015. Radiology has recommended MRI. Patient does be that cardiothoracic surgery has deferred this to primary care physician   Smoking  She continues to smoke  02/10/14 Follow up  Pt returns for 1 month follow up to discuss PFT results.  Was seen for pulmonary consult last month for dyspnea .  She underwent wedge resection of the left upper lobe and lymph node dissection. Diagnosis stage IA adenocarcinoma of the lung. Operative day June 2015. She continues to smoke , advised on smoking cessation  Has dry cough that waxes and wanes. Worse at night at times. Does get winded at times but seems to be getting better.  Underwent PFT today with FEV1 1.72 L , 92% , +BD response, FVC 88%, ratio 82 , Diffusing capacity  nml 88%.  No discolored mucus, fever, hemoptysis, chest pain , wheezing or overt reflux   REC Most important goal is to quit smoking.  May use ProAir HFA 2 puffs every 4hr as needed for wheezing or shortness of breath Advance activity as tolerated .  Follow up Dr. Chase Caller in 2 months and As needed      OV 04/10/2014  Chief Complaint  Patient presents with  . Follow-up    Pt c/o orthopnea, dizziness, prod cough with clear mucus. Pt states she feels the mucus she has coughed up recently has been a dark purple/red pieces-size of a pencil eraser. Pt states she has chest heaviness with and without activity.    Follow-up smoking, stage I lung cancer status post resection June 2015, chronic bronchitis  - She continues to smoke. She had a post lung cancer surgery surveillance visit in September 2015 with the surgeon chest x-ray that time was clear. She has been strongly advised to quit smoking but she is unable to. Now she reports new complaints for the last 3 weeks she has intermittent episodes of dizzy spells that are random, mild, spontaneous and transient. These associated with prior episodes so far off blurred vision that happened abruptly. She saw her primary care physician yesterday who wanted to know according to her report if any pulmonary cause can be activated to this. In addition to this for the last 1 week she's had  increased cough, chest tightness, wheezing and yellow sputum. There are also 2 episodes of hemoptysis. The first one was a week ago after she had some [place] food the second one was a few days ago that happened spontaneously. Both of small amounts. There is no associated fever or chills. Otherwise she feels good. She has upcoming CT chest for lung cancer surveillance 05/01/14  Walking desat test today with forehead probe 0- 185 feet 3 laps on room air: Did not desaturate below 91%   Review of Systems  Constitutional: Negative for fever and unexpected weight change.    HENT: Positive for congestion. Negative for dental problem, ear pain, nosebleeds, postnasal drip, rhinorrhea, sinus pressure, sneezing, sore throat and trouble swallowing.   Eyes: Negative for redness and itching.  Respiratory: Positive for cough, chest tightness and shortness of breath. Negative for wheezing.   Cardiovascular: Negative for palpitations and leg swelling.  Gastrointestinal: Negative for nausea and vomiting.  Genitourinary: Negative for dysuria.  Musculoskeletal: Negative for joint swelling.  Skin: Negative for rash.  Neurological: Negative for headaches.  Hematological: Does not bruise/bleed easily.  Psychiatric/Behavioral: Negative for dysphoric mood. The patient is not nervous/anxious.        Objective:   Physical Exam  Filed Vitals:   04/10/14 1411  BP: 122/80  Pulse: 89  Height: 5\' 1"  (1.549 m)  Weight: 220 lb (99.791 kg)  SpO2: 93%    Vitals reviewed. Constitutional: She is oriented to person, place, and time. She appears well-developed and well-nourished. No distress.  Body mass index is 41.59 kg/(m^2).    HENT:  Head: Normocephalic and atraumatic.  Right Ear: External ear normal.  Left Ear: External ear normal.  Mouth/Throat: Oropharynx is clear and moist. No oropharyngeal exudate.  Eyes: Conjunctivae and EOM are normal. Pupils are equal, round, and reactive to light. Right eye exhibits no discharge. Left eye exhibits no discharge. No scleral icterus.  Neck: Normal range of motion. Neck supple. No JVD present. No tracheal deviation present. No thyromegaly present.  Cardiovascular: Normal rate, regular rhythm, normal heart sounds and intact distal pulses.  Exam reveals no gallop and no friction rub.   No murmur heard. Pulmonary/Chest: Effort normal and breath sounds normal. No respiratory distress. She has no wheezes. She has no rales. She exhibits no tenderness.  Scar posteriorly left side - keloid +  Abdominal: Soft. Bowel sounds are normal. She  exhibits no distension and no mass. There is no tenderness. There is no rebound and no guarding.  Musculoskeletal: Normal range of motion. She exhibits no edema and no tenderness.  Lymphadenopathy:    She has no cervical adenopathy.  Neurological: She is alert and oriented to person, place, and time. She has normal reflexes. No cranial nerve deficit. She exhibits normal muscle tone. Coordination normal.  Skin: Skin is warm and dry. No rash noted. She is not diaphoretic. No erythema. No pallor.  Keloid in central chest  Psychiatric: She has a normal mood and affect. Her behavior is normal. Judgment and thought content normal.  Flat affect          Assessment & Plan:     ICD-9-CM ICD-10-CM   1. Hemoptysis 786.30 R04.2   2. Acute bronchitis due to other specified organisms 466.0 J20.8   3. Dizziness and giddiness 780.4 R42   4. Blurred vision, bilateral 368.8 H53.8   5. Smoking 305.1 Z72.0     #AcUte Bronchitis and hemoptysis  - likely due to acute bronchitis  -  take levaquin  500mg  once daily  X 5 days - Please take prednisone 40 mg x1 day, then 30 mg x1 day, then 20 mg x1 day, then 10 mg x1 day, and then 5 mg x1 day and stop - will be interested in CT chest information from 05/01/14 that you are doing for lung cancer followup   #SMoking  - please quit  - do full PFT in 4 weeks  #Lung cancer - keep up the following appointments on 05/01/14 for CT chest and followup Dr Servando Snare  #Dizziness and Blurred visipon  - refer neurology   #Followup  4 weeks with NP Tammy to review progess     Dr. Brand Males, M.D., [facility].C.P Pulmonary and Critical Care Medicine Staff Physician [facility] Pager: [phone], If no answer or between  15:00h - 7:00h: call [phone]  04/10/2014 2:49 PM

## 2014-04-10 NOTE — Addendum Note (Signed)
Addended by: Maurice March on: 04/10/2014 02:58 PM   Modules accepted: Orders

## 2014-04-10 NOTE — Patient Instructions (Addendum)
#  AcUte Bronchitis and hemoptysis  - likely due to acute bronchitis  -  take levaquin 500mg  once daily  X 5 days - Please take prednisone 40 mg x1 day, then 30 mg x1 day, then 20 mg x1 day, then 10 mg x1 day, and then 5 mg x1 day and stop - will be interested in CT chest information from 05/01/14 that you are doing for lung cancer followup   #SMoking  - please quit  - do full PFT in 4 weeks  #Lung cancer - keep up the following appointments on 05/01/14 for CT chest and followup Dr Servando Snare  #Dizziness and Blurred visipon  - refer neurology   #Followup  4 weeks with NP Tammy to review progess

## 2014-04-14 ENCOUNTER — Telehealth: Payer: Self-pay | Admitting: Internal Medicine

## 2014-04-14 MED ORDER — HYDROCODONE-HOMATROPINE 5-1.5 MG/5ML PO SYRP: 5.0000 mL | ORAL_SOLUTION | Freq: Four times a day (QID) | ORAL | Status: DC | PRN

## 2014-04-14 NOTE — Telephone Encounter (Signed)
OK 

## 2014-04-14 NOTE — Telephone Encounter (Signed)
Pt needs new rx hydrocodone cough syrup

## 2014-04-14 NOTE — Telephone Encounter (Signed)
Left detailed message Rx ready for pickup. Rx printed and signed. 

## 2014-04-14 NOTE — Telephone Encounter (Signed)
Please advise if can refill, was just filled on 11/17?

## 2014-04-15 ENCOUNTER — Encounter: Payer: Self-pay | Admitting: *Deleted

## 2014-04-15 NOTE — [facility] Oncology Navigator Note (Signed)
Called patient to check in.  Lauren Campos reports that Lauren Campos is doing OK.  Lauren Campos was taken off her oxygen following her last visit with Dr. Servando Snare and Lauren Campos reports that Lauren Campos has been doing well without it.  We discussed her upcoming appointment schedule.  Lauren Campos continues to smoke but has decreased the amount.  We discussed the importance of stopping smoking.  Lauren Campos denies any questions or concerns at this time.  I encouraged her to call me for any needs Lauren Campos may have.

## 2014-05-01 ENCOUNTER — Encounter: Payer: Self-pay | Admitting: Cardiothoracic Surgery

## 2014-05-01 ENCOUNTER — Ambulatory Visit
Admission: RE | Admit: 2014-05-01 | Discharge: 2014-05-01 | Disposition: A | Discharge status: Home / Self Care | Payer: BC Managed Care – PPO | Source: Ambulatory Visit | Attending: Cardiothoracic Surgery | Admitting: Cardiothoracic Surgery

## 2014-05-01 ENCOUNTER — Ambulatory Visit (INDEPENDENT_AMBULATORY_CARE_PROVIDER_SITE_OTHER): Payer: BC Managed Care – PPO | Admitting: Cardiothoracic Surgery

## 2014-05-01 VITALS — BP 118/73 | HR 76 | Resp 20 | Ht 61.0 in | Wt 212.0 lb

## 2014-05-01 DIAGNOSIS — C3410 Malignant neoplasm of upper lobe, unspecified bronchus or lung: Secondary | ICD-10-CM

## 2014-05-01 DIAGNOSIS — C3431 Malignant neoplasm of lower lobe, right bronchus or lung: Secondary | ICD-10-CM

## 2014-05-01 NOTE — Progress Notes (Signed)
[address]       [place] [address]             [phone]      English E Buntin Kouts Medical Record #[id number] Date of Birth: 1955/06/22  Referring: Brand Males, MD Primary Care: Nyoka Cowden, MD  Chief Complaint:   POST OP FOLLOW UP 10/28/2013  DATE OF DISCHARGE:  OPERATIVE REPORT  POSTOPERATIVE DIAGNOSIS: Left upper lobe lung lesion.  POSTOPERATIVE DIAGNOSIS: Left upper lobe lung lesion, probable  carcinoma, atypia on frozen section.  PROCEDURE PERFORMED: Bronchoscopy, left video-assisted thoracoscopy,  minithoracotomy, wedge resection of left upper lobe, and lymph node  dissection.   Adenocarcinoma of lung, stage 1   Primary site: Lung (Left)   Staging method: AJCC 7th Edition   Pathologic: Stage IA (T1a, N0, cM0) signed by [name], MD on 10/29/2013  5:11 PM   Summary: Stage IA (T1a, N0, cM0)  History of Present Illness:     Doing well with out sob, no dyspnea on exertion now approximate 6 months after resection of a stage I carcinoma the lung . Stopped smoking 3 weeks ago Patient noted she coughed up some maroon colored sputum, thought it was blood was seen by the pulmonologist and treated for bronchitis. She's had no recurrence of this.     Past Medical History  Diagnosis Date  .   07/20/2006  . BACK PAIN, LOW 07/20/2006  . BREAST LUMP 09/05/2007    "benign"  . CONSTIPATION, CHRONIC 08/10/2009  . DIVERTICULOSIS, COLON 08/10/2009  . Hemoptysis 08/27/2008  . HYPERLIPIDEMIA 05/08/2007  . HYPERLIPIDEMIA     "not anymore" (08/26/2013)  . INSOMNIA NOS 07/20/2006  . 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 OBESITY, NOS 07/20/2006  . Pulmonary nodule   . Heart murmur   . GERD (gastroesophageal reflux disease)   . Pneumonia 2014    "once" (08/26/2013)  . MIGRAINE, COMMON 05/08/2007    "haven't had one in ~ 6 months" (08/26/2013)  . Lung cancer   . Chest pain   . Dyspnea on exertion   . Tobacco abuse   . COPD (chronic obstructive  pulmonary disease)   . Leaky heart valve   . Arthritis   . Adenocarcinoma of lung, stage 1 08/26/2013     History  Smoking status  . Current Some Day Smoker -- 0.25 packs/day for 44 years  . Types: Cigarettes  Smokeless tobacco  . Never Used    Comment: smoking 10cigs/week    History  Alcohol Use No    Comment: occasional wine     No Known Allergies  Current Outpatient Prescriptions  Medication Sig Dispense Refill  . albuterol (PROVENTIL HFA;VENTOLIN HFA) 108 (90 BASE) MCG/ACT inhaler Inhale 2 puffs into the lungs every 6 (six) hours as needed for wheezing or shortness of breath. 1 Inhaler 6  . amitriptyline (ELAVIL) 100 MG tablet   1  . betamethasone valerate lotion (VALISONE) 0.1 % APPLY 1 APPLICATION TOPICALLY 2 (TWO) TIMES DAILY. 60 mL 2  . LORazepam (ATIVAN) 1 MG tablet Take 1 tablet (1 mg total) by mouth at bedtime. 60 tablet 1   No current facility-administered medications for this visit.       Physical Exam: BP 118/73 mmHg  Pulse 76  Resp 20  Ht 5\' 1"  (1.549 m)  Wt 212 lb (96.163 kg)  BMI 40.08 kg/m2  SpO2 93%  General appearance: alert and cooperative Neurologic: intact Heart: regular rate and rhythm, S1, S2 normal,  no murmur, click, rub or gallop Lungs: clear to auscultation bilaterally Abdomen: soft, non-tender; bowel sounds normal; no masses,  no organomegaly Extremities: extremities normal, atraumatic, no cyanosis or edema and Homans sign is negative, no sign of DVT Wound: The left chest incisions are well-healed without evidence of infection   Diagnostic Studies & Laboratory data:     Recent Radiology Findings:   Ct Chest Wo Contrast  05/01/2014   CLINICAL DATA:  Followup lung cancer. Left upper lobectomy June 2015.  EXAM: CT CHEST WITHOUT CONTRAST  TECHNIQUE: Multidetector CT imaging of the chest was performed following the standard protocol without IV contrast.  COMPARISON:  CT 07/29/2013  FINDINGS: Interval resection of the left upper lobe  nodule. Linear scarring at the surgical site without nodularity. No new nodularity in left or right lung.  There is extensive centrilobular emphysema.  No axillary or supraclavicular lymphadenopathy. No pericardial fluid. The esophagus is fluid-filled with gastric banding device in place. Limited view of the upper abdomen is unremarkable.  IMPRESSION: 1. Interval resection of left upper lobe nodule. 2. No new or suspicious nodularity. 3. Centrilobular emphysema. 4. Fluid within the esophagus which is mildly dilated. Gastric banding device noted.   Electronically Signed   By: Suzy Bouchard M.D.   On: 05/01/2014 09:16      Recent Lab Findings: Lab Results  Component Value Date   WBC 7.6 10/30/2013   HGB 11.5* 10/30/2013   HCT 36.9 10/30/2013   PLT 234 10/30/2013   GLUCOSE 111* 10/30/2013   CHOL 214* 06/07/2011   TRIG 63.0 06/07/2011   HDL 63.70 06/07/2011   LDLDIRECT 130.4 06/07/2011   LDLCALC * 05/13/2009    102        Total Cholesterol/HDL:CHD Risk Coronary Heart Disease Risk Table                     Men   Women  1/2 Average Risk   3.4   3.3  Average Risk       5.0   4.4  2 X Average Risk   9.6   7.1  3 X Average Risk  23.4   11.0        Use the calculated Patient Ratio above and the CHD Risk Table to determine the patient's CHD Risk.        ATP III CLASSIFICATION (LDL):  <100     mg/dL   Optimal  100-129  mg/dL   Near or Above                    Optimal  130-159  mg/dL   Borderline  160-189  mg/dL   High  >190     mg/dL   Very High   ALT 23 10/30/2013   AST 20 10/30/2013   NA 139 10/30/2013   K 4.5 10/30/2013   CL 103 10/30/2013   CREATININE 0.60 10/30/2013   BUN 10 10/30/2013   CO2 28 10/30/2013   TSH 1.18 06/07/2011   INR 0.94 10/24/2013   HGBA1C  05/12/2009    5.8 (NOTE) The ADA recommends the following therapeutic goal for glycemic control related to Hgb A1c measurement: Goal of therapy: <6.5 Hgb A1c  Reference: American Diabetes Association: Clinical  Practice Recommendations 2010, Diabetes Care, 2010, 33: (Suppl  1).      Assessment / Plan:   Improving physical status following wedge resection and node sampling for stage IA adenocarcinoma of the left lung. Again discussed with the  patient the need to stay off cigarettes, she notes she stopped smoking 3 weeks ago. She will return in 6 months with a followup ct of the chest     [name] MD      [address] [place] [address] Office [phone]   Beeper [phone]  05/01/2014 10:11 AM

## 2014-05-08 ENCOUNTER — Other Ambulatory Visit: Payer: Self-pay | Admitting: Internal Medicine

## 2014-05-08 DIAGNOSIS — R0600 Dyspnea, unspecified: Secondary | ICD-10-CM

## 2014-05-09 ENCOUNTER — Ambulatory Visit (INDEPENDENT_AMBULATORY_CARE_PROVIDER_SITE_OTHER): Payer: BC Managed Care – PPO | Admitting: Adult Health

## 2014-05-09 ENCOUNTER — Ambulatory Visit (INDEPENDENT_AMBULATORY_CARE_PROVIDER_SITE_OTHER): Payer: BC Managed Care – PPO | Admitting: Internal Medicine

## 2014-05-09 ENCOUNTER — Encounter: Payer: Self-pay | Admitting: Adult Health

## 2014-05-09 ENCOUNTER — Other Ambulatory Visit: Payer: Self-pay | Admitting: Internal Medicine

## 2014-05-09 VITALS — BP 106/68 | HR 87 | Temp 98.4°F | Ht 61.0 in | Wt 214.0 lb

## 2014-05-09 DIAGNOSIS — C3490 Malignant neoplasm of unspecified part of unspecified bronchus or lung: Secondary | ICD-10-CM

## 2014-05-09 DIAGNOSIS — R0600 Dyspnea, unspecified: Secondary | ICD-10-CM

## 2014-05-09 LAB — PULMONARY FUNCTION TEST
DL/VA % pred: 104 %
DL/VA: 4.58 ml/min/mmHg/L
DLCO UNC % PRED: 82 %
DLCO UNC: 16.76 ml/min/mmHg
FEF 25-75 POST: 1.75 L/s
FEF 25-75 PRE: 1.87 L/s
FEF2575-%CHANGE-POST: -6 %
FEF2575-%PRED-POST: 91 %
FEF2575-%PRED-PRE: 97 %
FEV1-%Change-Post: -1 %
FEV1-%PRED-PRE: 105 %
FEV1-%Pred-Post: 104 %
FEV1-PRE: 1.96 L
FEV1-Post: 1.94 L
FEV1FVC-%Change-Post: 1 %
FEV1FVC-%Pred-Pre: 99 %
FEV6-%CHANGE-POST: -3 %
FEV6-%PRED-POST: 103 %
FEV6-%Pred-Pre: 107 %
FEV6-Post: 2.36 L
FEV6-Pre: 2.45 L
FEV6FVC-%Change-Post: 0 %
FEV6FVC-%Pred-Post: 102 %
FEV6FVC-%Pred-Pre: 103 %
FVC-%Change-Post: -2 %
FVC-%PRED-POST: 101 %
FVC-%Pred-Pre: 104 %
FVC-POST: 2.4 L
FVC-Pre: 2.47 L
POST FEV1/FVC RATIO: 81 %
PRE FEV1/FVC RATIO: 79 %
Post FEV6/FVC ratio: 99 %
Pre FEV6/FVC Ratio: 99 %
RV % pred: 79 %
RV: 1.44 L
TLC % pred: 83 %
TLC: 3.84 L

## 2014-05-09 NOTE — Patient Instructions (Signed)
Great job on not smoking  Keep up good work  Breathing test looks very good Continue on current regimen   Follow up Dr. Chase Caller in 4 months and As needed

## 2014-05-09 NOTE — Progress Notes (Signed)
Subjective:    Patient ID: Lauren Campos, female    DOB: 07/20/1955, 58 y.o.   MRN: [id number]  HPI  OV 01/13/2014  Chief Complaint  Patient presents with  . Follow-up    Pt c/o SOB when standing for long periods of time and with exertion. Pt c/o dry cough and an "odd feeling in throat". Pt denies CP/tightness. Pt states since her lung surgery she has had numbness under left breast.   Followup visit for left upper lobe lung nodule and a smoker age 52   She underwent wedge resection of the left upper lobe and lymph node dissection. Diagnosis stage IA adenocarcinoma of the lung. Operative day June 2015. She is doing well overall. Main issues that she is having some postoperative dyspnea on exertion that is relieved by rest. Class II in severity. No associated chest pain diaphoresis, edema, orthopnea, paroxysmal nocturnal dyspnea. There is no associated cough  the new issue is that she is having mild left inframammary numbness and paresthesias occurred afterr surgery and she feels is related to chest tube removal   Also she is concerned about left hepatic lesion seen on CT scan of the chest December 2014 but did not light up on PET scan and is stable on followup imaging in March 2015. Radiology has recommended MRI. Patient does be that cardiothoracic surgery has deferred this to primary care physician   Smoking  She continues to smoke  02/10/14 Follow up  Pt returns for 1 month follow up to discuss PFT results.  Was seen for pulmonary consult last month for dyspnea .  She underwent wedge resection of the left upper lobe and lymph node dissection. Diagnosis stage IA adenocarcinoma of the lung. Operative day June 2015. She continues to smoke , advised on smoking cessation  Has dry cough that waxes and wanes. Worse at night at times. Does get winded at times but seems to be getting better.  Underwent PFT today with FEV1 1.72 L , 92% , +BD response, FVC 88%, ratio 82 , Diffusing capacity  nml 88%.  No discolored mucus, fever, hemoptysis, chest pain , wheezing or overt reflux   REC Most important goal is to quit smoking.  May use ProAir HFA 2 puffs every 4hr as needed for wheezing or shortness of breath Advance activity as tolerated .  Follow up Dr. Chase Caller in 2 months and As needed      OV 04/10/2014  Chief Complaint  Patient presents with  . Follow-up    Pt c/o orthopnea, dizziness, prod cough with clear mucus. Pt states she feels the mucus she has coughed up recently has been a dark purple/red pieces-size of a pencil eraser. Pt states she has chest heaviness with and without activity.    Follow-up smoking, stage I lung cancer status post resection June 2015, chronic bronchitis  - She continues to smoke. She had a post lung cancer surgery surveillance visit in September 2015 with the surgeon chest x-ray that time was clear. She has been strongly advised to quit smoking but she is unable to. Now she reports new complaints for the last 3 weeks she has intermittent episodes of dizzy spells that are random, mild, spontaneous and transient. These associated with prior episodes so far off blurred vision that happened abruptly. She saw her primary care physician yesterday who wanted to know according to her report if any pulmonary cause can be activated to this. In addition to this for the last 1 week she's had  increased cough, chest tightness, wheezing and yellow sputum. There are also 2 episodes of hemoptysis. The first one was a week ago after she had some [place] food the second one was a few days ago that happened spontaneously. Both of small amounts. There is no associated fever or chills. Otherwise she feels good. She has upcoming CT chest for lung cancer surveillance 05/01/14  Walking desat test today with forehead probe 0- 185 feet 3 laps on room air: Did not desaturate below 91%   05/09/2014 Follow up and PFT (Stage IA adenocarcinoma of the LUL s/p resection 10/2013)    Patient returns for a follow-up visit Patient says that she's been doing very well since last visit. She denies any chest pain, orthopnea, PND or leg swelling, hemoptysis, or unintentional weight loss. Patient had a PFT. This morning that showed normal lung function with no evidence of airflow obstruction FEV1 was 105%, ratio 79, FVC of 104%, no significant bronchodilator response, diffusing capacity 82% Had CT chest on December 10 that showed emphysema. No new or suspicious nodularity. Quit smoking 1 month ago.      Review of Systems  Constitutional: Negative for fever and unexpected weight change.  HENT:  Negative for dental problem, ear pain, nosebleeds, postnasal drip, rhinorrhea, sinus pressure, sneezing, sore throat and trouble swallowing.   Eyes: Negative for redness and itching.  Respiratory: Positive for cough, chest tightness     Negative for wheezing.   Cardiovascular: Negative for palpitations and leg swelling.  Gastrointestinal: Negative for nausea and vomiting.  Genitourinary: Negative for dysuria.  Musculoskeletal: Negative for joint swelling.  Skin: Negative for rash.  Neurological: Negative for headaches.  Hematological: Does not bruise/bleed easily.  Psychiatric/Behavioral: Negative for dysphoric mood. The patient is not nervous/anxious.        Objective:   Physical Exam  Vitals reviewed. Constitutional: She is oriented to person, place, and time. She appears well-developed and well-nourished. No distress.  Marland Kitchen    HENT:  Head: Normocephalic and atraumatic.  Right Ear: External ear normal.  Left Ear: External ear normal.  Mouth/Throat: Oropharynx is clear and moist. No oropharyngeal exudate.  Eyes: Conjunctivae and EOM are normal. Pupils are equal, round, and reactive to light. Right eye exhibits no discharge. Left eye exhibits no discharge. No scleral icterus.  Neck: Normal range of motion. Neck supple. No JVD present. No tracheal deviation present. No  thyromegaly present.  Cardiovascular: Normal rate, regular rhythm, normal heart sounds and intact distal pulses.  Exam reveals no gallop and no friction rub.   No murmur heard. Pulmonary/Chest: Effort normal and breath sounds normal. No respiratory distress. She has no wheezes. She has no rales. She exhibits no tenderness.  Scar posteriorly left side - keloid +  Abdominal: Soft. Bowel sounds are normal. She exhibits no distension and no mass. There is no tenderness. There is no rebound and no guarding.  Musculoskeletal: Normal range of motion. She exhibits no edema and no tenderness.  Lymphadenopathy:    She has no cervical adenopathy.  Neurological: She is alert and oriented to person, place, and time. She has normal reflexes. No cranial nerve deficit. She exhibits normal muscle tone. Coordination normal.  Skin: Skin is warm and dry. No rash noted. She is not diaphoretic. No erythema. No pallor.  Keloid in central chest  Psychiatric: She has a normal mood and affect. Her behavior is normal. Judgment and thought content normal.  Flat affect          Assessment & Plan:

## 2014-05-09 NOTE — Progress Notes (Signed)
PFT done today. 

## 2014-05-09 NOTE — Addendum Note (Signed)
Addended by: Parke Poisson E on: 05/09/2014 04:32 PM   Modules accepted: Orders

## 2014-05-09 NOTE — Assessment & Plan Note (Addendum)
CT chest 05/01/14 showed emphysema. No new or suspicious nodularity. Reviewed CT results Despite resection and smoking she has preserved lung fxn on PFT  Congratulated on smoking cessation  Repeat CT in 6 months

## 2014-05-13 ENCOUNTER — Ambulatory Visit (INDEPENDENT_AMBULATORY_CARE_PROVIDER_SITE_OTHER): Payer: BC Managed Care – PPO

## 2014-05-13 ENCOUNTER — Ambulatory Visit: Payer: BC Managed Care – PPO

## 2014-05-13 VITALS — BP 148/79 | HR 86 | Resp 13 | Ht 61.0 in | Wt 212.0 lb

## 2014-05-13 DIAGNOSIS — M79673 Pain in unspecified foot: Secondary | ICD-10-CM

## 2014-05-13 DIAGNOSIS — M722 Plantar fascial fibromatosis: Secondary | ICD-10-CM

## 2014-05-13 DIAGNOSIS — M5416 Radiculopathy, lumbar region: Secondary | ICD-10-CM

## 2014-05-13 DIAGNOSIS — M792 Neuralgia and neuritis, unspecified: Secondary | ICD-10-CM

## 2014-05-13 MED ORDER — TRIAMCINOLONE ACETONIDE 10 MG/ML IJ SUSP: 10.0000 mg | Freq: Once | INTRAMUSCULAR | Status: DC

## 2014-05-13 NOTE — Patient Instructions (Signed)

## 2014-05-13 NOTE — Progress Notes (Signed)
Subjective:    Patient ID: Lauren Campos, female    DOB: [birth year], 58 y.o.   MRN: [id number]  HPI Comments: Pt states she had the knot on the left medial plantar fascia for years, but began to hurt about 2 weeks ago especially in shoes and walking.    Pt complains of electrical type pain in the right leg and foot throughout the day and has worsened over the last 3 weeks.  Pt states she has been seen by a chiropractor for her back.  Foot Pain Associated symptoms include arthralgias.      Review of Systems  Eyes: Positive for visual disturbance.  Gastrointestinal: Positive for constipation.  Musculoskeletal: Positive for back pain and arthralgias.  Skin:       Thick scar  All other systems reviewed and are negative.      Objective:   Physical Exam 58 year old [place] female well-developed well-nourished or to 3 presents at this time with 2 issues first her left foot has a nodular lesion in the medial band of the plantar fascia proximal to the MTP area been there for a year and half or more painful with certain activities walking certain shoes recently gotten worse doesn't remember any acute injury or trauma been there for a long time second issue is her right leg describes electric type pain sensation right leg right foot throughout the day of worsened over the last several weeks previous is seen by a chiropractor for some manipulation apparently some difficulty with her back since that time starting to have abnormal sensations with tingling burning and shooting sensations consistent with neuritis or neuralgia and lumbar radiculopathy. Exam of the foot both feet at this time as follows vascular status appears to be intact pedal pulses are palpable DP +2 PT plus one over 4 bilateral Refill time 3 seconds all digits epicritic and proprioceptive sensations intact and symmetric bilateral there is normal plantar response DTRs not elicited there is no refusal paresthesia on direct  lateral compression the forefoot left or right no pain or paresthesia dorsal flexion plantar flexion left or right foot there is tenderness on the medial plantar fascial nodular lesion left mid arch which is consistent with fibromatosis however no shooting pain or sensation reducible on removal of the foot or ankle on the right side her pain Is with prolonged standing walking activities or bending or stooping stooping a certain way may elicit symptoms into her right leg and down to her foot.       Assessment & Plan:  Assessment #1 plantar fiber ptosis left mid arch plan at this time injection tendons Kenalog 20 minute of Xylocaine plain infiltrated intralesionally into the plantar fibroma itself patient how the injection well recommend ice and massage therapy and exercise to the area maintaining ice every evening as instructed. Maintain a firm stable shoe at all times no barefoot no flimsy shoes or flip-flops  Assessment number to is neuritis or number radiculopathy. Patient may have digit may be developing some lumbar radiculopathy-type issues or chronic pain like it issues or symptomology and neuritis or neuralgia does not reducible on palpation of the foot and leg this appears to be associated with her lower back patient has been treated by a chiropractor in the recent past although this may not elicited the problem it may have contributed to or just the problem to be associated with her lumbar spine. Referral to neurosurgery for evaluation of lumbar radiculopathy and peripheral neuropathy. Recheck in one month for  evaluation of fibromatosis we'll follow-up with neuro as instructed  Harriet Masson DPM

## 2014-05-19 ENCOUNTER — Ambulatory Visit (INDEPENDENT_AMBULATORY_CARE_PROVIDER_SITE_OTHER): Payer: BC Managed Care – PPO | Admitting: Internal Medicine

## 2014-05-19 ENCOUNTER — Encounter: Payer: Self-pay | Admitting: Internal Medicine

## 2014-05-19 VITALS — BP 118/80 | HR 81 | Temp 98.0°F | Resp 20 | Ht 61.0 in | Wt 213.0 lb

## 2014-05-19 DIAGNOSIS — M545 Low back pain, unspecified: Secondary | ICD-10-CM

## 2014-05-19 DIAGNOSIS — C3490 Malignant neoplasm of unspecified part of unspecified bronchus or lung: Secondary | ICD-10-CM

## 2014-05-19 DIAGNOSIS — R202 Paresthesia of skin: Secondary | ICD-10-CM

## 2014-05-19 MED ORDER — HYDROCODONE-HOMATROPINE 5-1.5 MG/5ML PO SYRP: 5.0000 mL | ORAL_SOLUTION | Freq: Four times a day (QID) | ORAL | Status: DC | PRN

## 2014-05-19 MED ORDER — TRAZODONE HCL 50 MG PO TABS: 50.0000 mg | ORAL_TABLET | Freq: Every day | ORAL | Status: AC

## 2014-05-19 MED ORDER — LORAZEPAM 1 MG PO TABS: 1.0000 mg | ORAL_TABLET | Freq: Every day | ORAL | Status: DC

## 2014-05-19 NOTE — Patient Instructions (Signed)
Nerve conduction study as discussed

## 2014-05-19 NOTE — Progress Notes (Signed)
Subjective:    Patient ID: Lauren Campos, female    DOB: 1955-07-18, 58 y.o.   MRN: [id number]  HPI 58 year old patient who has a long history of intermittent low back pain.  She is status post lumbar MRIs, her last in 2013.  This revealed facet arthropathy with some bulging disc and degenerative changes.  For the past 3 weeks she has had tingling involving her entire right leg.  She describes this as an electric shock type sensation.  Pain is present almost constantly, but sporadically.  He is symptom-free.  Denies any weakness involving the leg. She has a history of stage I lung cancer and is status post resection in June of this year.  She has recently discontinued tobacco use, but still complains of cough.  Past Medical History  Diagnosis Date  .   07/20/2006  . BACK PAIN, LOW 07/20/2006  . BREAST LUMP 09/05/2007    "benign"  . CONSTIPATION, CHRONIC 08/10/2009  . DIVERTICULOSIS, COLON 08/10/2009  . Hemoptysis 08/27/2008  . HYPERLIPIDEMIA 05/08/2007  . HYPERLIPIDEMIA     "not anymore" (08/26/2013)  . INSOMNIA NOS 07/20/2006  . 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 OBESITY, NOS 07/20/2006  . Pulmonary nodule   . Heart murmur   . GERD (gastroesophageal reflux disease)   . Pneumonia 2014    "once" (08/26/2013)  . MIGRAINE, COMMON 05/08/2007    "haven't had one in ~ 6 months" (08/26/2013)  . Lung cancer   . Chest pain   . Dyspnea on exertion   . Tobacco abuse   . COPD (chronic obstructive pulmonary disease)   . Leaky heart valve   . Arthritis   . Adenocarcinoma of lung, stage 1 08/26/2013    History   Social History  . Marital Status: Divorced    Spouse Name: N/A    Number of Children: N/A  . Years of Education: N/A   Occupational History  . Not on file.   Social History Main Topics  . Smoking status: Former Smoker -- 0.25 packs/day for 44 years    Types: Cigarettes    Quit date: 04/14/2014  . Smokeless tobacco: Never Used     Comment: smoking 10cigs/week  . Alcohol Use: No   Comment: occasional wine  . Drug Use: No  . Sexual Activity: Not Currently   Other Topics Concern  . Not on file   Social History Narrative    Past Surgical History  Procedure Laterality Date  . Laparoscopic gastric banding  ~ 2006  . Colonoscopy    . Bronchoscopy  08/26/2013  . Abdominal hysterectomy  1990's  . Tubal ligation  1980's  . Shoulder arthroscopy w/ rotator cuff repair Left 2014  . Wrist fracture surgery Left ~ 2010  . Video bronchoscopy N/A 08/26/2013    Procedure: VIDEO BRONCHOSCOPY;  Surgeon: Melrose Nakayama, MD;  Location: [facility];  Service: Thoracic;  Laterality: N/A;  . Video bronchoscopy N/A 10/28/2013    Procedure: VIDEO BRONCHOSCOPY;  Surgeon: [name], MD;  Location: [facility] OR;  Service: Thoracic;  Laterality: N/A;  . Video assisted thoracoscopy (vats)/wedge resection Left 10/28/2013    Procedure: VIDEO ASSISTED THORACOSCOPY (VATS) lung left,  mini left thorocotomy, wedge resection left upper lobe nodule, lymph node dissection.;  Surgeon: [name], MD;  Location: [facility];  Service: Thoracic;  Laterality: Left;    Family History  Problem Relation Age of Onset  . Cancer Mother   . Diabetes Mother   . Heart disease Mother   .  Cancer Father   . Diabetes Sister   . Heart attack Brother   . Heart disease Brother     No Known Allergies  Current Outpatient Prescriptions on File Prior to Visit  Medication Sig Dispense Refill  . amitriptyline (ELAVIL) 100 MG tablet TAKE 1 TABLET (100 MG TOTAL) BY MOUTH AT BEDTIME. 90 tablet 1   Current Facility-Administered Medications on File Prior to Visit  Medication Dose Route Frequency Provider Last Rate Last Dose  . triamcinolone acetonide (KENALOG) 10 MG/ML injection 10 mg  10 mg Other Once [employer], DPM        BP 118/80 mmHg  Pulse 81  Temp(Src) 98 F (36.7 C) (Oral)  Resp 20  Ht 5\' 1"  (1.549 m)  Wt 213 lb (96.616 kg)  BMI 40.27 kg/m2  SpO2 98%      Review of Systems  Constitutional:  Negative.   HENT: Negative for congestion, dental problem, hearing loss, rhinorrhea, sinus pressure, sore throat and tinnitus.   Eyes: Negative for pain, discharge and visual disturbance.  Respiratory: Negative for cough and shortness of breath.   Cardiovascular: Negative for chest pain, palpitations and leg swelling.  Gastrointestinal: Negative for nausea, vomiting, abdominal pain, diarrhea, constipation, blood in stool and abdominal distention.  Genitourinary: Negative for dysuria, urgency, frequency, hematuria, flank pain, vaginal bleeding, vaginal discharge, difficulty urinating, vaginal pain and pelvic pain.  Musculoskeletal: Positive for back pain. Negative for joint swelling, arthralgias and gait problem.  Skin: Negative for rash.  Neurological: Positive for numbness. Negative for dizziness, syncope, speech difficulty, weakness and headaches.  Hematological: Negative for adenopathy.  Psychiatric/Behavioral: Negative for behavioral problems, dysphoric mood and agitation. The patient is not nervous/anxious.        Objective:   Physical Exam  Constitutional: She appears well-developed and well-nourished. No distress.  Overweight Normal blood pressure No apparent distress  Musculoskeletal:  Negative straight leg test Normal gait Normal ankle.  Extension and flexion Patellar and Achilles reflexes intact          Assessment & Plan:   Right leg paresthesias.  Possible radiculopathy.  We'll check nerve conduction studies History of stage I lung cancer Chronic low back pain.  Stable at present

## 2014-05-19 NOTE — Progress Notes (Signed)
Pre visit review using our clinic review tool, if applicable. No additional management support is needed unless otherwise documented below in the visit note. 

## 2014-05-29 ENCOUNTER — Telehealth: Payer: Self-pay | Admitting: Internal Medicine

## 2014-05-29 ENCOUNTER — Ambulatory Visit (INDEPENDENT_AMBULATORY_CARE_PROVIDER_SITE_OTHER): Payer: 59 | Admitting: Neurology

## 2014-05-29 DIAGNOSIS — M5417 Radiculopathy, lumbosacral region: Secondary | ICD-10-CM

## 2014-05-29 DIAGNOSIS — R202 Paresthesia of skin: Secondary | ICD-10-CM

## 2014-05-29 NOTE — Telephone Encounter (Signed)
Pt called to ask for a referral to see Dr

## 2014-05-29 NOTE — Procedures (Signed)
[facility]  [address], [address]  [place], [place] [address] Tel: [phone] Fax:  [phone] Test Date:  05/29/2014  Patient: Lauren Campos DOB: [birth year] Physician: Narda Amber, DO  Sex: Female Height: 5\' 1"  Ref Phys: Bluford Kaufmann  ID#: [id number] Temp: 34.3C Technician: Laureen Ochs R. NCS T.   Patient Complaints: Patient is a 59 year old female here for evaluation of her right radicular leg pain.  NCV & EMG Findings: Extensive electrodiagnostic testing of the right lower extremity shows: 1. Right sural and superficial peroneal sensory responses are within normal limits. 2. Right peroneal and tibial motor responses are within normal limits. The left tibial motor responses also within normal limits. 3. Bilateral H reflexes studies are normal. 4. Sparse running motor axon loss changes are seen affecting the L5-S1 myotomes, without accompanied active denervation.  Impression: 1. Chronic L5-S1 radiculopathy affecting the right lower extremity, mild in degree electrically.  2. There is no evidence of a generalized sensorimotor polyneuropathy affecting the right lower extremity.     ___________________________ Narda Amber, DO    Nerve Conduction Studies Anti Sensory Summary Table   Site NR Peak (ms) Norm Peak (ms) P-T Amp (V) Norm P-T Amp  Right Sup Peroneal Anti Sensory (Ant Lat Mall)  12 cm    2.7 <4.6 26.9 >4  Right Sural Anti Sensory (Lat Mall)  Calf    3.9 <4.6 18.0 >4   Motor Summary Table   Site NR Onset (ms) Norm Onset (ms) O-P Amp (mV) Norm O-P Amp Site1 Site2 Delta-0 (ms) Dist (cm) Vel (m/s) Norm Vel (m/s)  Right Peroneal Motor (Ext Dig Brev)  Ankle    3.7 <6.0 5.4 >2.5 B Fib Ankle 5.8 30.5 53 >40  B Fib    9.5  5.1  Poplt B Fib 1.9 10.0 53 >40  Poplt    11.4  5.0         Right Peroneal TA Motor (Tib Ant)  32C  Fib Head    1.8 <4.5 5.9 >3 Poplit Fib Head 2.0 11.0 55 >40  Poplit    3.8  5.9         Left Tibial Motor (Abd Hall  Brev)  32C  Ankle    2.8 <6.0 6.1 >4 Knee Ankle 8.8 38.0 43 >40  Knee    11.6  5.5         Right Tibial Motor (Abd Hall Brev)  32C  Ankle    2.7 <6.0 6.2 >4 Knee Ankle 8.6 38.0 44 >40  Knee    11.3  4.4          H Reflex Studies   NR H-Lat (ms) Lat Norm (ms) L-R H-Lat (ms)  Left Tibial (Gastroc)  32C     31.43 <35 1.09  Right Tibial (Gastroc)  32C     32.52 <35 1.09   EMG   Side Muscle Ins Act Fibs Psw Fasc Number Recrt Dur Dur. Amp Amp. Poly Poly. Comment  Right AntTibialis Nml Nml Nml Nml Nml Mod-R Few 1+ Few 1+ Nml Nml N/A  Right Gastroc Nml Nml Nml Nml Nml Mod-R Few 1+ Few 1+ Nml Nml N/A  Right Flex Dig Long Nml Nml Nml Nml Nml Nml Nml Nml Nml Nml Nml Nml N/A  Right RectFemoris Nml Nml Nml Nml Nml Nml Nml Nml Nml Nml Nml Nml N/A  Right GluteusMed Nml Nml Nml Nml Nml Mod-R Few 1+ Few 1+ Nml Nml N/A  Right Lumbo Parasp Low  Nml Nml Nml Nml Nml Nml Nml Nml Nml Nml Nml Nml N/A  Right BicepsFemS Nml Nml Nml Nml Nml Mod-R Nml Nml Nml Nml Nml Nml N/A      Waveforms:

## 2014-05-30 ENCOUNTER — Telehealth: Payer: Self-pay | Admitting: Internal Medicine

## 2014-05-30 MED ORDER — TRAMADOL HCL 50 MG PO TABS: 50.0000 mg | ORAL_TABLET | Freq: Four times a day (QID) | ORAL | Status: AC | PRN

## 2014-05-30 MED ORDER — HYDROCODONE-HOMATROPINE 5-1.5 MG/5ML PO SYRP: 5.0000 mL | ORAL_SOLUTION | Freq: Four times a day (QID) | ORAL | Status: DC | PRN

## 2014-05-30 NOTE — Telephone Encounter (Signed)
Pt would like nerve conduction  study results. Pt had test yesterday

## 2014-05-30 NOTE — Telephone Encounter (Signed)
Discussed with Dr. Raliegh Ip pt would like something for pain also. Verbal order given for Tramadol 50 mg one tablet every 6 hours as needed for pain.

## 2014-05-30 NOTE — Telephone Encounter (Signed)
Pt notified Rx's ready for pickup. Rx's printed and signed.

## 2014-05-30 NOTE — Telephone Encounter (Signed)
Please see message and advise 

## 2014-05-30 NOTE — Telephone Encounter (Signed)
Pt would like results from Nerve Conduction study, please advise.

## 2014-05-30 NOTE — Telephone Encounter (Signed)
Pt would like to know if dr Raliegh Ip  will give her some cough medicine and some pain medicine for her back.  Pt also states she would like a bigger bottle of the cough medicine.

## 2014-05-30 NOTE — Telephone Encounter (Signed)
Spoke to pt, told her nerve conduction study showed mild irritation of a lumbar nerve root. Symptoms should improve. No further x-rays or surgery needed at this time unless clinical worsening per Dr. Raliegh Ip. Pt verbalized understanding.

## 2014-05-30 NOTE — Telephone Encounter (Signed)
Hydromet 6 ounces 1 teaspoon  every 6 hours as needed for cough and congestion

## 2014-05-30 NOTE — Telephone Encounter (Signed)
Please call/notify patient that lab/test/procedure showed mild irritation of a lumbar nerve root.  Symptoms should improve.  No further x-rays or surgery needed at this time unless clinical worsening

## 2014-06-01 ENCOUNTER — Encounter (HOSPITAL_COMMUNITY): Payer: Self-pay

## 2014-06-01 ENCOUNTER — Emergency Department (HOSPITAL_COMMUNITY): Payer: 59

## 2014-06-01 ENCOUNTER — Emergency Department (HOSPITAL_COMMUNITY)
Admission: EM | Admit: 2014-06-01 | Discharge: 2014-06-01 | Disposition: A | Discharge status: Home / Self Care | Payer: 59 | Attending: Emergency Medicine | Admitting: Emergency Medicine

## 2014-06-01 DIAGNOSIS — Z79899 Other long term (current) drug therapy: Secondary | ICD-10-CM | POA: Diagnosis not present

## 2014-06-01 DIAGNOSIS — J449 Chronic obstructive pulmonary disease, unspecified: Secondary | ICD-10-CM | POA: Insufficient documentation

## 2014-06-01 DIAGNOSIS — J159 Unspecified bacterial pneumonia: Secondary | ICD-10-CM | POA: Insufficient documentation

## 2014-06-01 DIAGNOSIS — Z87891 Personal history of nicotine dependence: Secondary | ICD-10-CM | POA: Insufficient documentation

## 2014-06-01 DIAGNOSIS — E669 Obesity, unspecified: Secondary | ICD-10-CM | POA: Diagnosis not present

## 2014-06-01 DIAGNOSIS — G4700 Insomnia, unspecified: Secondary | ICD-10-CM | POA: Diagnosis not present

## 2014-06-01 DIAGNOSIS — K219 Gastro-esophageal reflux disease without esophagitis: Secondary | ICD-10-CM | POA: Diagnosis not present

## 2014-06-01 DIAGNOSIS — Z8679 Personal history of other diseases of the circulatory system: Secondary | ICD-10-CM | POA: Diagnosis not present

## 2014-06-01 DIAGNOSIS — R011 Cardiac murmur, unspecified: Secondary | ICD-10-CM | POA: Diagnosis not present

## 2014-06-01 DIAGNOSIS — J189 Pneumonia, unspecified organism: Secondary | ICD-10-CM

## 2014-06-01 DIAGNOSIS — Z85118 Personal history of other malignant neoplasm of bronchus and lung: Secondary | ICD-10-CM | POA: Diagnosis not present

## 2014-06-01 DIAGNOSIS — G43909 Migraine, unspecified, not intractable, without status migrainosus: Secondary | ICD-10-CM | POA: Diagnosis not present

## 2014-06-01 DIAGNOSIS — R05 Cough: Secondary | ICD-10-CM

## 2014-06-01 DIAGNOSIS — R053 Chronic cough: Secondary | ICD-10-CM

## 2014-06-01 DIAGNOSIS — M1990 Unspecified osteoarthritis, unspecified site: Secondary | ICD-10-CM | POA: Diagnosis not present

## 2014-06-01 DIAGNOSIS — Z8742 Personal history of other diseases of the female genital tract: Secondary | ICD-10-CM | POA: Diagnosis not present

## 2014-06-01 LAB — BASIC METABOLIC PANEL
Anion gap: 7 (ref 5–15)
BUN: 7 mg/dL (ref 6–23)
CO2: 33 mmol/L — ABNORMAL HIGH (ref 19–32)
Calcium: 9.5 mg/dL (ref 8.4–10.5)
Chloride: 101 mEq/L (ref 96–112)
Creatinine, Ser: 0.7 mg/dL (ref 0.50–1.10)
GFR calc Af Amer: >90 mL/min (ref >90)
GFR calc non Af Amer: >90 mL/min (ref >90)
Glucose, Bld: 115 mg/dL — ABNORMAL HIGH (ref 70–99)
Potassium: 3.6 mmol/L (ref 3.5–5.1)
Sodium: 141 mmol/L (ref 135–145)

## 2014-06-01 LAB — CBC WITH DIFFERENTIAL/PLATELET
Basophils Absolute: 0 10*3/uL (ref 0.0–0.1)
Basophils Relative: 0 % (ref 0–1)
Eosinophils Absolute: 0.5 10*3/uL (ref 0.0–0.7)
Eosinophils Relative: 6 % — ABNORMAL HIGH (ref 0–5)
HCT: 37.4 % (ref 36.0–46.0)
Hemoglobin: 11.7 g/dL — ABNORMAL LOW (ref 12.0–15.0)
Lymphocytes Relative: 27 % (ref 12–46)
Lymphs Abs: 2.1 10*3/uL (ref 0.7–4.0)
MCH: 29.5 pg (ref 26.0–34.0)
MCHC: 31.3 g/dL (ref 30.0–36.0)
MCV: 94.2 fL (ref 78.0–100.0)
Monocytes Absolute: 0.4 10*3/uL (ref 0.1–1.0)
Monocytes Relative: 6 % (ref 3–12)
Neutro Abs: 4.9 10*3/uL (ref 1.7–7.7)
Neutrophils Relative %: 61 % (ref 43–77)
Platelets: 331 10*3/uL (ref 150–400)
RBC: 3.97 MIL/uL (ref 3.87–5.11)
RDW: 14 % (ref 11.5–15.5)
WBC: 7.9 10*3/uL (ref 4.0–10.5)

## 2014-06-01 LAB — BRAIN NATRIURETIC PEPTIDE: B Natriuretic Peptide: 33.3 pg/mL (ref 0.0–100.0)

## 2014-06-01 MED ORDER — SALINE SPRAY 0.65 % NA SOLN: 2.0000 | NASAL | Status: AC | PRN

## 2014-06-01 MED ORDER — OMEPRAZOLE 20 MG PO CPDR: 20.0000 mg | DELAYED_RELEASE_CAPSULE | Freq: Every day | ORAL | Status: AC

## 2014-06-01 MED ORDER — LEVOFLOXACIN 500 MG PO TABS: 500.0000 mg | ORAL_TABLET | Freq: Every day | ORAL | Status: DC

## 2014-06-01 NOTE — ED Provider Notes (Signed)
CSN: [id number]     Arrival date & time 06/01/14  1411 History   First MD Initiated Contact with Patient 06/01/14 1548     Chief Complaint  Patient presents with  . Cough     (Consider location/radiation/quality/duration/timing/severity/associated sxs/prior Treatment) HPI Pt is a 59yo female with hx of lung cancer, had part of her left lung removed in June 2015, presenting to ED today with c/o a persistent cough for 2-3 months. Pt states she has f/u with her CT surgeon and pulmonologist as well as her PCP but states none of her providers have been able to determine cause of her cough. Pt states cough is worse at night and early morning. States she wakes up with a wet cough.  Denies fever, chills, n/v/d. Denies recent change in her cough. States she came in today because she is tired of being unable to sleep due to her cough.  Pt's daughter called concerned pt was not describing her symptoms properly.  Daughter is concerned as pt wakes up at night coughing "profusely" a wet cough. Daughter states she is a Marine scientist and is concerned the pt is going to develop aspiration pneumonia. States cough was dry at first but has become wet in the last 2-3 weeks.  Daughter reports pt had lap-band surgery in 2006 and is concerned there is a complication that is causing her current cough.    Past Medical History  Diagnosis Date  .   07/20/2006  . BACK PAIN, LOW 07/20/2006  . BREAST LUMP 09/05/2007    "benign"  . CONSTIPATION, CHRONIC 08/10/2009  . DIVERTICULOSIS, COLON 08/10/2009  . Hemoptysis 08/27/2008  . HYPERLIPIDEMIA 05/08/2007  . HYPERLIPIDEMIA     "not anymore" (08/26/2013)  . INSOMNIA NOS 07/20/2006  . 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 OBESITY, NOS 07/20/2006  . Pulmonary nodule   . Heart murmur   . GERD (gastroesophageal reflux disease)   . Pneumonia 2014    "once" (08/26/2013)  . MIGRAINE, COMMON 05/08/2007    "haven't had one in ~ 6 months" (08/26/2013)  . Lung cancer   . Chest pain   . Dyspnea on exertion   .  Tobacco abuse   . COPD (chronic obstructive pulmonary disease)   . Leaky heart valve   . Arthritis   . Adenocarcinoma of lung, stage 1 08/26/2013   Past Surgical History  Procedure Laterality Date  . Laparoscopic gastric banding  ~ 2006  . Colonoscopy    . Bronchoscopy  08/26/2013  . Abdominal hysterectomy  1990's  . Tubal ligation  1980's  . Shoulder arthroscopy w/ rotator cuff repair Left 2014  . Wrist fracture surgery Left ~ 2010  . Video bronchoscopy N/A 08/26/2013    Procedure: VIDEO BRONCHOSCOPY;  Surgeon: Melrose Nakayama, MD;  Location: [facility];  Service: Thoracic;  Laterality: N/A;  . Video bronchoscopy N/A 10/28/2013    Procedure: VIDEO BRONCHOSCOPY;  Surgeon: [name], MD;  Location: [facility] OR;  Service: Thoracic;  Laterality: N/A;  . Video assisted thoracoscopy (vats)/wedge resection Left 10/28/2013    Procedure: VIDEO ASSISTED THORACOSCOPY (VATS) lung left,  mini left thorocotomy, wedge resection left upper lobe nodule, lymph node dissection.;  Surgeon: [name], MD;  Location: [facility];  Service: Thoracic;  Laterality: Left;   Family History  Problem Relation Age of Onset  . Cancer Mother   . Diabetes Mother   . Heart disease Mother   . Cancer Father   . Diabetes Sister   . Heart attack Brother   .  Heart disease Brother    History  Substance Use Topics  . Smoking status: Former Smoker -- 0.25 packs/day for 44 years    Types: Cigarettes    Quit date: 04/14/2014  . Smokeless tobacco: Never Used     Comment: smoking 10cigs/week  . Alcohol Use: No     Comment: occasional wine   OB History    No data available     Review of Systems  Constitutional: Negative for fever and chills.  HENT: Negative for congestion.   Respiratory: Positive for cough. Negative for shortness of breath.   Cardiovascular: Negative for chest pain and palpitations.  Gastrointestinal: Negative for nausea, vomiting and diarrhea.  All other systems reviewed and are  negative.     Allergies  Review of patient's allergies indicates no known allergies.  Home Medications   Prior to Admission medications   Medication Sig Start Date End Date Taking? Authorizing Provider  amitriptyline (ELAVIL) 100 MG tablet TAKE 1 TABLET (100 MG TOTAL) BY MOUTH AT BEDTIME. 05/09/14  Yes Marletta Lor, MD  betamethasone valerate lotion (VALISONE) 0.1 % Apply 1 application topically as needed.  03/08/14  Yes Historical Provider, MD  guaiFENesin (MUCINEX) 600 MG 12 hr tablet Take 600 mg by mouth 2 (two) times daily as needed for cough (cough).   Yes Historical Provider, MD  HYDROcodone-homatropine (HYCODAN) 5-1.5 MG/5ML syrup Take 5 mLs by mouth every 6 (six) hours as needed for cough. 05/30/14  Yes Marletta Lor, MD  LORazepam (ATIVAN) 1 MG tablet Take 1 tablet (1 mg total) by mouth at bedtime. 05/19/14  Yes Marletta Lor, MD  traMADol (ULTRAM) 50 MG tablet Take 1 tablet (50 mg total) by mouth every 6 (six) hours as needed. 05/30/14  Yes Marletta Lor, MD  traZODone (DESYREL) 50 MG tablet Take 1 tablet (50 mg total) by mouth at bedtime. 05/19/14  Yes Marletta Lor, MD  levofloxacin (LEVAQUIN) 500 MG tablet Take 1 tablet (500 mg total) by mouth daily. 06/01/14   Noland Fordyce, PA-C  omeprazole (PRILOSEC) 20 MG capsule Take 1 capsule (20 mg total) by mouth daily. 06/01/14   Noland Fordyce, PA-C  sodium chloride (OCEAN) 0.65 % SOLN nasal spray Place 2 sprays into both nostrils as needed for congestion. 06/01/14   Noland Fordyce, PA-C   BP 110/56 mmHg  Pulse 72  Temp(Src) 98.5 F (36.9 C) (Oral)  Resp 18  SpO2 97% Physical Exam  Constitutional: She appears well-developed and well-nourished. No distress.  HENT:  Head: Normocephalic and atraumatic.  Eyes: Conjunctivae are normal. No scleral icterus.  Neck: Normal range of motion.  Cardiovascular: Normal rate, regular rhythm and normal heart sounds.   Pulmonary/Chest: Effort normal and breath sounds  normal. No respiratory distress. She has no wheezes. She has no rales. She exhibits no tenderness.  Abdominal: Soft. Bowel sounds are normal. She exhibits no distension and no mass. There is no tenderness. There is no rebound and no guarding.  Musculoskeletal: Normal range of motion.  Neurological: She is alert.  Skin: Skin is warm and dry. She is not diaphoretic.  Nursing note and vitals reviewed.   ED Course  Procedures (including critical care time) Labs Review Labs Reviewed  CBC WITH DIFFERENTIAL - Abnormal; Notable for the following:    Hemoglobin 11.7 (*)    Eosinophils Relative 6 (*)    All other components within normal limits  BASIC METABOLIC PANEL - Abnormal; Notable for the following:    CO2 33 (*)  Glucose, Bld 115 (*)    All other components within normal limits  BRAIN NATRIURETIC PEPTIDE  I-STAT TROPOININ, ED    Imaging Review Dg Chest 2 View  06/01/2014   CLINICAL DATA:  Cough for 2 months.  Chest pain.  EXAM: CHEST  2 VIEW  COMPARISON:  Chest radiograph 02/12/2014  FINDINGS: Multiple monitoring leads overlie the patient. Stable cardiac and mediastinal contours. Minimal heterogeneous opacities right lung base. Postsurgical change left upper hemi thorax. No pleural effusion or pneumothorax. Regional skeleton is unremarkable  IMPRESSION: Minimal right lung base heterogeneous opacities may represent atelectasis. Infection not excluded.   Electronically Signed   By: Lovey Newcomer M.D.   On: 06/01/2014 17:32     EKG Interpretation   Date/Time:  Sunday June 01 2014 16:48:44 EST Ventricular Rate:  80 PR Interval:  177 QRS Duration: 101 QT Interval:  429 QTC Calculation: 495 R Axis:   13 Text Interpretation:  Sinus rhythm Borderline prolonged QT interval  Confirmed by Jeneen Rinks  MD, Forbes ([id number]) on 06/01/2014 11:56:21 PM        MDM   Final diagnoses:  CAP (community acquired pneumonia)  Persistent cough    Pt presenting to ED with c/o persistent cough. Pt had  partial lobectomy of left lung due to lung cancer in June 2015.  Cough persistent for 2-3 months. No fever, chills, n/v/d. Pt appears well, non-toxic. No respiratory distress. Lungs: CTAB. Afebrile, O2- 98% on RA.  Cough is worse at night and early morning, c/w acid reflux. Discussed pt with Dr. Jeneen Rinks, cardiac workup performed to ensure no CHF. CXR: concerning for possible pneumonia. Although pt is asymptomatic in ED, will tx for suspected aspiration pneumonia due to reported hx from pt and daughter.  Rx: levoquin.   Doubt PE as pt denies CP.  HR and RR are WNL. O2- 98% on RA.  Symptoms have persisted for 2-3 months. Pt hemodynamically stable for discharge home. Will also discharge pt home with ocean nasal saline and omeprazole. Advised to f/u with PCP as well as [facility] if cough persists after antibiotic tx.   Home care instructions provided. Return precautions provided. Pt verbalized understanding and agreement with tx plan.   Noland Fordyce, PA-C 06/02/14 0003  Tanna Furry, MD 06/10/14 [phone]

## 2014-06-01 NOTE — Discharge Instructions (Signed)
Cool Mist Vaporizers Vaporizers may help relieve the symptoms of a cough and cold. They add moisture to the air, which helps mucus to become thinner and less sticky. This makes it easier to breathe and cough up secretions. Cool mist vaporizers do not cause serious burns like hot mist vaporizers, which may also be called steamers or humidifiers. Vaporizers have not been proven to help with colds. You should not use a vaporizer if you are allergic to mold. HOME CARE INSTRUCTIONS  Follow the package instructions for the vaporizer.  Do not use anything other than distilled water in the vaporizer.  Do not run the vaporizer all of the time. This can cause mold or bacteria to grow in the vaporizer.  Clean the vaporizer after each time it is used.  Clean and dry the vaporizer well before storing it.  Stop using the vaporizer if worsening respiratory symptoms develop. Document Released: 02/04/2004 Document Revised: 05/14/2013 Document Reviewed: 09/26/2012 [facility] Patient Information 2015 [place], [place]. This information is not intended to replace advice given to you by your health care provider. Make sure you discuss any questions you have with your health care provider.

## 2014-06-01 NOTE — ED Notes (Signed)
She reports persistent cough for "weeks now".  She states she has recently received three prescriptions for cough syrup (hydrocodone); but cough persists, esp. At night and in early morning.  She is in no distress.  She states that in June of 2015 she had ~ 390 percent of her left lung removed d/t cancer and that no subsequent chemo. Nor radiation was needed.  She is healthy-looking in appearance.

## 2014-06-02 LAB — I-STAT TROPONIN, ED: Troponin i, poc: 0 ng/mL (ref 0.00–0.08)

## 2014-06-04 ENCOUNTER — Telehealth: Payer: Self-pay | Admitting: Internal Medicine

## 2014-06-04 NOTE — Telephone Encounter (Signed)
Spoke to pt, told her I can not prescribe a bigger bottle 120 ml's is the most they will prescribe at one time and I just filled it on 1/8. Pt verbalized and stated she was in the ED on Sunday and diagnosed with Pneumonia. Discussed with Dr. Raliegh Ip said he will refill one more time. Told pt will refill Hycodan cough syrup one more time will have Rx ready tomorrow after 10: AM. Pt verbalized understanding.

## 2014-06-04 NOTE — Telephone Encounter (Signed)
Pt would like another rx for HYDROcodone-homatropine (HYCODAN) 5-1.5 MG/5ML syrup "the big bottle".

## 2014-06-05 MED ORDER — HYDROCODONE-HOMATROPINE 5-1.5 MG/5ML PO SYRP: 5.0000 mL | ORAL_SOLUTION | Freq: Four times a day (QID) | ORAL | Status: DC | PRN

## 2014-06-05 NOTE — Telephone Encounter (Signed)
Pt notified Rx ready for pickup. Rx printed and signed.  

## 2014-06-06 ENCOUNTER — Telehealth: Payer: Self-pay | Admitting: Internal Medicine

## 2014-06-06 NOTE — Telephone Encounter (Signed)
Pt is calling to let md know tramadol is not work. Please advise

## 2014-06-06 NOTE — Telephone Encounter (Signed)
Please see message and advise 

## 2014-06-06 NOTE — Telephone Encounter (Signed)
Please have patient drop by for a prescription for Vicodin after she completes hydrocodone-based cough medicine

## 2014-06-09 ENCOUNTER — Encounter: Payer: Self-pay | Admitting: Neurology

## 2014-06-09 NOTE — Telephone Encounter (Signed)
Spoke to pt, told her when she is finished with her Hycodan cough syrup then Dr.K will give you a Rx for pain medicine. Pt verbalized understanding and will call back when needs it.

## 2014-06-11 MED ORDER — HYDROCODONE-ACETAMINOPHEN 5-325 MG PO TABS: 1.0000 | ORAL_TABLET | Freq: Four times a day (QID) | ORAL | Status: DC | PRN

## 2014-06-11 NOTE — Telephone Encounter (Signed)
Patient states she finished the cough medication last night and would like the pain RX.

## 2014-06-11 NOTE — Addendum Note (Signed)
Addended by: Marian Sorrow on: 06/11/2014 12:24 PM   Modules accepted: Orders

## 2014-06-11 NOTE — Telephone Encounter (Signed)
Left detailed message Rx ready for pickup. Rx printed and signed. 

## 2014-06-16 ENCOUNTER — Encounter: Payer: Self-pay | Admitting: Internal Medicine

## 2014-06-16 ENCOUNTER — Ambulatory Visit (INDEPENDENT_AMBULATORY_CARE_PROVIDER_SITE_OTHER): Payer: 59 | Admitting: Internal Medicine

## 2014-06-16 VITALS — BP 120/70 | HR 81 | Temp 98.1°F | Resp 20 | Ht 61.0 in | Wt 208.0 lb

## 2014-06-16 DIAGNOSIS — M5417 Radiculopathy, lumbosacral region: Secondary | ICD-10-CM

## 2014-06-16 NOTE — Progress Notes (Signed)
   Subjective:    Patient ID: Lauren Campos, female    DOB: 02/19/1956, 59 y.o.   MRN: [id number]  HPI  59 year old patient who is seen today complaining of chronic right leg pain.  She describes both a chronic achiness as well as frequent paroxysms of electric-like shooting pains involving the entire right leg and foot.  She did have recent nerve conduction studies that suggested a mild chronic right L5-S1 radiculopathy.  She now describes her pain as " 10" on a scale of 0-10.  No motor weakness   Review of Systems  Constitutional: Negative.   HENT: Negative for congestion, dental problem, hearing loss, rhinorrhea, sinus pressure, sore throat and tinnitus.   Eyes: Negative for pain, discharge and visual disturbance.  Respiratory: Negative for cough and shortness of breath.   Cardiovascular: Negative for chest pain, palpitations and leg swelling.  Gastrointestinal: Negative for nausea, vomiting, abdominal pain, diarrhea, constipation, blood in stool and abdominal distention.  Genitourinary: Negative for dysuria, urgency, frequency, hematuria, flank pain, vaginal bleeding, vaginal discharge, difficulty urinating, vaginal pain and pelvic pain.  Musculoskeletal: Negative for joint swelling, arthralgias and gait problem.  Skin: Negative for rash.  Neurological: Negative for dizziness, syncope, speech difficulty, weakness, numbness and headaches.  Hematological: Negative for adenopathy.  Psychiatric/Behavioral: Negative for behavioral problems, dysphoric mood and agitation. The patient is not nervous/anxious.        Objective:   Physical Exam  Constitutional: She appears well-developed and well-nourished. No distress.  Musculoskeletal:  Negative straight leg test Both patellar and Achilles reflexes brisk and equal bilaterally Able to walk on toes and heels          Assessment & Plan:  Right lumbar radiculopathy.  Symptoms seem out of proportion to objective findings, will set up  for consideration for epidural

## 2014-06-16 NOTE — Patient Instructions (Addendum)
Orthopedic follow-up as scheduledSciatica Sciatica is pain, weakness, numbness, or tingling along your sciatic nerve. The nerve starts in the lower back and runs down the back of each leg. Nerve damage or certain conditions pinch or put pressure on the sciatic nerve. This causes the pain, weakness, and other discomforts of sciatica. HOME CARE   Only take medicine as told by your doctor.  Apply ice to the affected area for 20 minutes. Do this 3-4 times a day for the first 48-72 hours. Then try heat in the same way.  Exercise, stretch, or do your usual activities if these do not make your pain worse.  Go to physical therapy as told by your doctor.  Keep all doctor visits as told.  Do not wear high heels or shoes that are not supportive.  Get a firm mattress if your mattress is too soft to lessen pain and discomfort. GET HELP RIGHT AWAY IF:   You cannot control when you poop (bowel movement) or pee (urinate).  You have more weakness in your lower back, lower belly (pelvis), butt (buttocks), or legs.  You have redness or puffiness (swelling) of your back.  You have a burning feeling when you pee.  You have pain that gets worse when you lie down.  You have pain that wakes you from your sleep.  Your pain is worse than past pain.  Your pain lasts longer than 4 weeks.  You are suddenly losing weight without reason. MAKE SURE YOU:   Understand these instructions.  Will watch this condition.  Will get help right away if you are not doing well or get worse. Document Released: 02/16/2008 Document Revised: 11/08/2011 Document Reviewed: 09/18/2011 [facility] Patient Information 2015 [place], [place]. This information is not intended to replace advice given to you by your health care provider. Make sure you discuss any questions you have with your health care provider.

## 2014-06-16 NOTE — Progress Notes (Signed)
Pre visit review using our clinic review tool, if applicable. No additional management support is needed unless otherwise documented below in the visit note. 

## 2014-06-20 ENCOUNTER — Other Ambulatory Visit: Payer: Self-pay | Admitting: Internal Medicine

## 2014-06-20 DIAGNOSIS — M5417 Radiculopathy, lumbosacral region: Secondary | ICD-10-CM

## 2014-06-23 ENCOUNTER — Telehealth: Payer: Self-pay | Admitting: Internal Medicine

## 2014-06-23 NOTE — Telephone Encounter (Signed)
Pt said she questions about her visit to the neurologist. She said she will call you back.

## 2014-06-23 NOTE — Telephone Encounter (Signed)
Spoke to Lauren Campos asked her how I can help her. Lauren Campos said she wanted to know if Dr. Raliegh Ip can write a letter saying her problem is from the Chiropractor she had. Told her Dr. Raliegh Ip can not do that cause not sure if it came from that or not. Lauren Campos verbalized understanding.

## 2014-06-24 ENCOUNTER — Telehealth: Payer: Self-pay | Admitting: Internal Medicine

## 2014-06-24 DIAGNOSIS — M5417 Radiculopathy, lumbosacral region: Secondary | ICD-10-CM

## 2014-06-24 DIAGNOSIS — M79604 Pain in right leg: Secondary | ICD-10-CM

## 2014-06-24 NOTE — Telephone Encounter (Signed)
Patient states she need an order submitted for a MRI to [facility].  She was advised that she needed a MRI also.  She was told that once the order is entered, they will schedule both appointments on the same day.

## 2014-06-25 NOTE — Telephone Encounter (Signed)
Order for MRI put in EPIC

## 2014-06-25 NOTE — Telephone Encounter (Signed)
Please advise if can order MRI and diagnosis.

## 2014-06-25 NOTE — Telephone Encounter (Signed)
Okay for lumbar MRI

## 2014-06-30 ENCOUNTER — Telehealth: Payer: Self-pay | Admitting: Internal Medicine

## 2014-06-30 MED ORDER — HYDROCODONE-ACETAMINOPHEN 5-325 MG PO TABS: 1.0000 | ORAL_TABLET | Freq: Four times a day (QID) | ORAL | Status: DC | PRN

## 2014-06-30 NOTE — Telephone Encounter (Signed)
Pt needs new rx hydrocodone. Pt will be out of med tomorrow

## 2014-06-30 NOTE — Telephone Encounter (Signed)
ok 

## 2014-06-30 NOTE — Telephone Encounter (Signed)
Pt requesting refill on Hydrocodone, last filled 1/20 # 60. Pt states will be out of medication tomorrow. Please advise if okay to refill?

## 2014-06-30 NOTE — Telephone Encounter (Signed)
Left detailed message Rx ready for pickup, will be at the front desk. Rx printed and signed. 

## 2014-07-02 ENCOUNTER — Encounter: Payer: Self-pay | Admitting: *Deleted

## 2014-07-02 NOTE — [facility] Oncology Navigator Note (Signed)
I called patient to check in and remind her about her upcoming appointments.  Patient reports that in regards to her history of lung cancer, she is doing very well.  She is currently having severe pain in her leg and is scheduled for an MRI of the spine and Epidurography next week.  She reports that she has completely stopped smoking.  She denies any questions or concerns at this time.  I encouraged her to call me for any needs.

## 2014-07-04 ENCOUNTER — Other Ambulatory Visit: Payer: Self-pay

## 2014-07-04 DIAGNOSIS — Z85118 Personal history of other malignant neoplasm of bronchus and lung: Secondary | ICD-10-CM

## 2014-07-04 DIAGNOSIS — Z1231 Encounter for screening mammogram for malignant neoplasm of breast: Secondary | ICD-10-CM

## 2014-07-06 ENCOUNTER — Ambulatory Visit
Admission: RE | Admit: 2014-07-06 | Discharge: 2014-07-06 | Disposition: A | Discharge status: Home / Self Care | Payer: 59 | Source: Ambulatory Visit | Attending: Internal Medicine | Admitting: Internal Medicine

## 2014-07-06 DIAGNOSIS — M79604 Pain in right leg: Secondary | ICD-10-CM

## 2014-07-06 DIAGNOSIS — M5417 Radiculopathy, lumbosacral region: Secondary | ICD-10-CM

## 2014-07-07 ENCOUNTER — Other Ambulatory Visit: Payer: Self-pay | Admitting: Internal Medicine

## 2014-07-07 ENCOUNTER — Other Ambulatory Visit: Payer: Self-pay

## 2014-07-07 DIAGNOSIS — M5126 Other intervertebral disc displacement, lumbar region: Secondary | ICD-10-CM

## 2014-07-08 ENCOUNTER — Other Ambulatory Visit: Payer: Self-pay | Admitting: Internal Medicine

## 2014-07-09 ENCOUNTER — Telehealth: Payer: Self-pay | Admitting: Internal Medicine

## 2014-07-09 NOTE — Telephone Encounter (Signed)
Please reschedule epidural with interventional radiology

## 2014-07-09 NOTE — Telephone Encounter (Signed)
Left message on voicemail to call office.  

## 2014-07-09 NOTE — Telephone Encounter (Signed)
Please see message and call pt.

## 2014-07-09 NOTE — Telephone Encounter (Signed)
Spoke to pt told her order for Epidural is already in and that they contacted you on Feb 2 nd but you had not called them back. Pt said she just had called them and  is scheduled for Monday. Told her okay. Pt asked if heard from Neurosurgeon. Told pt no they will contact you regarding an appt. Pt verbalized understanding.

## 2014-07-09 NOTE — Telephone Encounter (Signed)
Pt would like to know if you think its ok to go ahead and get an injection and then go to the surgeon.  The pain is very bad and if its going to take long to get appt w/ surgeon, pt would like to proceed w/ inj while she waits. Pt would like to explain exactly what MRI showed.

## 2014-07-14 ENCOUNTER — Ambulatory Visit
Admission: RE | Admit: 2014-07-14 | Discharge: 2014-07-14 | Disposition: A | Discharge status: Home / Self Care | Payer: 59 | Source: Ambulatory Visit | Attending: Internal Medicine | Admitting: Internal Medicine

## 2014-07-14 DIAGNOSIS — M545 Low back pain: Secondary | ICD-10-CM

## 2014-07-14 DIAGNOSIS — M5417 Radiculopathy, lumbosacral region: Secondary | ICD-10-CM

## 2014-07-14 MED ORDER — METHYLPREDNISOLONE ACETATE 40 MG/ML INJ SUSP (RADIOLOG
120.0000 mg | Freq: Once | INTRAMUSCULAR | Status: AC
  Administered 2014-07-14: 120 mg via EPIDURAL

## 2014-07-14 MED ORDER — IOHEXOL 180 MG/ML  SOLN
1.0000 mL | Freq: Once | INTRAMUSCULAR | Status: AC | PRN
  Administered 2014-07-14: 1 mL via EPIDURAL

## 2014-07-14 NOTE — Discharge Instructions (Signed)

## 2014-07-17 ENCOUNTER — Telehealth: Payer: Self-pay | Admitting: Internal Medicine

## 2014-07-17 NOTE — Telephone Encounter (Signed)
Please see message and advise 

## 2014-07-17 NOTE — Telephone Encounter (Signed)
Pt is still having leg pain and hydrocodone is not work. Pt an appt with dr Arnoldo Morale neurosurgeon on 07-25-14. [facility]

## 2014-07-17 NOTE — Progress Notes (Signed)
Patient called today complaining of increased pain in her legs after an LESI three days ago.  I reminded her it may take up to a week for the steroid to get absorbed and start relieving her pain and that in the meantime the steroid injection may actually aggravate her symptoms.  She knows to call her referring doctor early next week if she is no better.  Brita Romp, RN

## 2014-07-18 ENCOUNTER — Other Ambulatory Visit: Payer: Self-pay | Admitting: *Deleted

## 2014-07-18 ENCOUNTER — Emergency Department (HOSPITAL_COMMUNITY)
Admission: EM | Admit: 2014-07-18 | Discharge: 2014-07-18 | Disposition: A | Discharge status: Home / Self Care | Payer: 59 | Attending: Emergency Medicine | Admitting: Emergency Medicine

## 2014-07-18 ENCOUNTER — Emergency Department (HOSPITAL_COMMUNITY): Payer: 59

## 2014-07-18 ENCOUNTER — Encounter (HOSPITAL_COMMUNITY): Payer: Self-pay | Admitting: Emergency Medicine

## 2014-07-18 DIAGNOSIS — E876 Hypokalemia: Secondary | ICD-10-CM | POA: Diagnosis not present

## 2014-07-18 DIAGNOSIS — Z8669 Personal history of other diseases of the nervous system and sense organs: Secondary | ICD-10-CM | POA: Diagnosis not present

## 2014-07-18 DIAGNOSIS — Z8701 Personal history of pneumonia (recurrent): Secondary | ICD-10-CM | POA: Insufficient documentation

## 2014-07-18 DIAGNOSIS — J449 Chronic obstructive pulmonary disease, unspecified: Secondary | ICD-10-CM | POA: Diagnosis not present

## 2014-07-18 DIAGNOSIS — Z85118 Personal history of other malignant neoplasm of bronchus and lung: Secondary | ICD-10-CM | POA: Diagnosis not present

## 2014-07-18 DIAGNOSIS — K219 Gastro-esophageal reflux disease without esophagitis: Secondary | ICD-10-CM | POA: Diagnosis not present

## 2014-07-18 DIAGNOSIS — Z8679 Personal history of other diseases of the circulatory system: Secondary | ICD-10-CM | POA: Diagnosis not present

## 2014-07-18 DIAGNOSIS — R1110 Vomiting, unspecified: Secondary | ICD-10-CM | POA: Diagnosis present

## 2014-07-18 DIAGNOSIS — E669 Obesity, unspecified: Secondary | ICD-10-CM | POA: Diagnosis not present

## 2014-07-18 DIAGNOSIS — Z7951 Long term (current) use of inhaled steroids: Secondary | ICD-10-CM | POA: Insufficient documentation

## 2014-07-18 DIAGNOSIS — Z87891 Personal history of nicotine dependence: Secondary | ICD-10-CM | POA: Diagnosis not present

## 2014-07-18 DIAGNOSIS — R011 Cardiac murmur, unspecified: Secondary | ICD-10-CM | POA: Insufficient documentation

## 2014-07-18 DIAGNOSIS — K2970 Gastritis, unspecified, without bleeding: Secondary | ICD-10-CM | POA: Insufficient documentation

## 2014-07-18 DIAGNOSIS — Z79899 Other long term (current) drug therapy: Secondary | ICD-10-CM | POA: Insufficient documentation

## 2014-07-18 DIAGNOSIS — Z8742 Personal history of other diseases of the female genital tract: Secondary | ICD-10-CM | POA: Insufficient documentation

## 2014-07-18 DIAGNOSIS — M1990 Unspecified osteoarthritis, unspecified site: Secondary | ICD-10-CM | POA: Insufficient documentation

## 2014-07-18 DIAGNOSIS — R52 Pain, unspecified: Secondary | ICD-10-CM

## 2014-07-18 LAB — URINALYSIS, ROUTINE W REFLEX MICROSCOPIC
GLUCOSE, UA: NEGATIVE mg/dL
Hgb urine dipstick: NEGATIVE
Ketones, ur: NEGATIVE mg/dL
Leukocytes, UA: NEGATIVE
Nitrite: NEGATIVE
PROTEIN: 100 mg/dL — AB
Specific Gravity, Urine: 1.029 (ref 1.005–1.030)
UROBILINOGEN UA: 1 mg/dL (ref 0.0–1.0)
pH: 5.5 (ref 5.0–8.0)

## 2014-07-18 LAB — CBC WITH DIFFERENTIAL/PLATELET
BASOS ABS: 0.1 10*3/uL (ref 0.0–0.1)
Basophils Relative: 1 % (ref 0–1)
EOS PCT: 2 % (ref 0–5)
Eosinophils Absolute: 0.2 10*3/uL (ref 0.0–0.7)
HCT: 38.3 % (ref 36.0–46.0)
Hemoglobin: 12.4 g/dL (ref 12.0–15.0)
Lymphocytes Relative: 33 % (ref 12–46)
Lymphs Abs: 3 10*3/uL (ref 0.7–4.0)
MCH: 29.5 pg (ref 26.0–34.0)
MCHC: 32.4 g/dL (ref 30.0–36.0)
MCV: 91.2 fL (ref 78.0–100.0)
MONO ABS: 0.7 10*3/uL (ref 0.1–1.0)
MONOS PCT: 7 % (ref 3–12)
NEUTROS ABS: 5.2 10*3/uL (ref 1.7–7.7)
Neutrophils Relative %: 57 % (ref 43–77)
PLATELETS: 355 10*3/uL (ref 150–400)
RBC: 4.2 MIL/uL (ref 3.87–5.11)
RDW: 14.1 % (ref 11.5–15.5)
WBC: 9.1 10*3/uL (ref 4.0–10.5)

## 2014-07-18 LAB — COMPREHENSIVE METABOLIC PANEL
ALK PHOS: 69 U/L (ref 39–117)
ALT: 14 U/L (ref 0–35)
ANION GAP: 8 (ref 5–15)
AST: 18 U/L (ref 0–37)
Albumin: 4.3 g/dL (ref 3.5–5.2)
BILIRUBIN TOTAL: 0.4 mg/dL (ref 0.3–1.2)
BUN: 15 mg/dL (ref 6–23)
CHLORIDE: 100 mmol/L (ref 96–112)
CO2: 32 mmol/L (ref 19–32)
Calcium: 10.3 mg/dL (ref 8.4–10.5)
Creatinine, Ser: 0.73 mg/dL (ref 0.50–1.10)
GFR calc non Af Amer: >90 mL/min (ref >90)
Glucose, Bld: 110 mg/dL — ABNORMAL HIGH (ref 70–99)
POTASSIUM: 2.6 mmol/L — AB (ref 3.5–5.1)
SODIUM: 140 mmol/L (ref 135–145)
Total Protein: 7.5 g/dL (ref 6.0–8.3)

## 2014-07-18 LAB — URINE MICROSCOPIC-ADD ON

## 2014-07-18 LAB — I-STAT TROPONIN, ED: Troponin i, poc: 0 ng/mL (ref 0.00–0.08)

## 2014-07-18 LAB — LIPASE, BLOOD: Lipase: 27 U/L (ref 11–59)

## 2014-07-18 MED ORDER — SODIUM CHLORIDE 0.9 % IV SOLN
Freq: Once | INTRAVENOUS | Status: AC
  Administered 2014-07-18: 13:00:00 via INTRAVENOUS

## 2014-07-18 MED ORDER — GI COCKTAIL ~~LOC~~
30.0000 mL | Freq: Once | ORAL | Status: AC
  Administered 2014-07-18: 30 mL via ORAL
  Filled 2014-07-18: qty 30

## 2014-07-18 MED ORDER — POTASSIUM CHLORIDE CRYS ER 20 MEQ PO TBCR
40.0000 meq | EXTENDED_RELEASE_TABLET | Freq: Once | ORAL | Status: AC
  Administered 2014-07-18: 40 meq via ORAL
  Filled 2014-07-18: qty 2

## 2014-07-18 MED ORDER — POTASSIUM CHLORIDE 10 MEQ/100ML IV SOLN
10.0000 meq | Freq: Once | INTRAVENOUS | Status: AC
  Administered 2014-07-18: 10 meq via INTRAVENOUS
  Filled 2014-07-18: qty 100

## 2014-07-18 MED ORDER — PANTOPRAZOLE SODIUM 20 MG PO TBEC: 20.0000 mg | DELAYED_RELEASE_TABLET | Freq: Every day | ORAL | Status: AC

## 2014-07-18 MED ORDER — ONDANSETRON 4 MG PO TBDP: ORAL_TABLET | ORAL | Status: AC

## 2014-07-18 MED ORDER — OXYCODONE-ACETAMINOPHEN 5-325 MG PO TABS: 1.0000 | ORAL_TABLET | Freq: Four times a day (QID) | ORAL | Status: DC | PRN

## 2014-07-18 MED ORDER — POTASSIUM CHLORIDE CRYS ER 20 MEQ PO TBCR: 20.0000 meq | EXTENDED_RELEASE_TABLET | Freq: Every day | ORAL | Status: AC

## 2014-07-18 NOTE — Discharge Instructions (Signed)
Follow up with your md next week. °

## 2014-07-18 NOTE — ED Notes (Signed)
Pt states that she has been having burning, aching central CP x 35 minutes with vomiting.  States she threw up "the whole ride here".  Pt states hx of vomiting d/t lap band procedure in 2006.  Has had more trouble in recent months (since November) with vomiting.  States she has already had some of the fluid taken out of her lap band.

## 2014-07-18 NOTE — Telephone Encounter (Signed)
Spoke to pt, told her Dr.K said he will give her a Rx for Oxycodone 5 -325 mg till appointment with Neurosurgeon next week. Rx ready for pickup. Rx printed and signed. Pt verbalized understanding and will come by to get Rx.

## 2014-07-18 NOTE — Telephone Encounter (Signed)
Discussed with Dr. Raliegh Ip, verbal order given for Percocet 5 mg/325 mg one tablet by mouth every 6 hours PRN,

## 2014-07-18 NOTE — ED Provider Notes (Signed)
CSN: [id number]     Arrival date & time 07/18/14  1054 History   First MD Initiated Contact with Patient 07/18/14 1213     Chief Complaint  Patient presents with  . Chest Pain  . Emesis     (Consider location/radiation/quality/duration/timing/severity/associated sxs/prior Treatment) Patient is a 59 y.o. female presenting with chest pain and vomiting. The history is provided by the patient (the pt complains of burning in her chest).  Chest Pain Pain location:  Substernal area and epigastric Pain quality: aching   Pain radiates to:  Does not radiate Pain radiates to the back: no   Pain severity:  Moderate Onset quality:  Sudden Timing:  Intermittent Progression:  Waxing and waning Chronicity:  New Context: not breathing   Relieved by:  Nothing Associated symptoms: vomiting   Associated symptoms: no abdominal pain, no back pain, no cough, no fatigue and no headache   Emesis Associated symptoms: no abdominal pain, no diarrhea and no headaches     Past Medical History  Diagnosis Date  .   07/20/2006  . BACK PAIN, LOW 07/20/2006  . BREAST LUMP 09/05/2007    "benign"  . CONSTIPATION, CHRONIC 08/10/2009  . DIVERTICULOSIS, COLON 08/10/2009  . Hemoptysis 08/27/2008  . HYPERLIPIDEMIA 05/08/2007  . HYPERLIPIDEMIA     "not anymore" (08/26/2013)  . INSOMNIA NOS 07/20/2006  . 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 OBESITY, NOS 07/20/2006  . Pulmonary nodule   . Heart murmur   . GERD (gastroesophageal reflux disease)   . Pneumonia 2014    "once" (08/26/2013)  . MIGRAINE, COMMON 05/08/2007    "haven't had one in ~ 6 months" (08/26/2013)  . Lung cancer   . Chest pain   . Dyspnea on exertion   . Tobacco abuse   . COPD (chronic obstructive pulmonary disease)   . Leaky heart valve   . Arthritis   . Adenocarcinoma of lung, stage 1 08/26/2013   Past Surgical History  Procedure Laterality Date  . Laparoscopic gastric banding  ~ 2006  . Colonoscopy    . Bronchoscopy  08/26/2013  . Abdominal hysterectomy   1990's  . Tubal ligation  1980's  . Shoulder arthroscopy w/ rotator cuff repair Left 2014  . Wrist fracture surgery Left ~ 2010  . Video bronchoscopy N/A 08/26/2013    Procedure: VIDEO BRONCHOSCOPY;  Surgeon: Melrose Nakayama, MD;  Location: [place];  Service: Thoracic;  Laterality: N/A;  . Video bronchoscopy N/A 10/28/2013    Procedure: VIDEO BRONCHOSCOPY;  Surgeon: [name], MD;  Location: [facility] OR;  Service: Thoracic;  Laterality: N/A;  . Video assisted thoracoscopy (vats)/wedge resection Left 10/28/2013    Procedure: VIDEO ASSISTED THORACOSCOPY (VATS) lung left,  mini left thorocotomy, wedge resection left upper lobe nodule, lymph node dissection.;  Surgeon: [name], MD;  Location: [place];  Service: Thoracic;  Laterality: Left;   Family History  Problem Relation Age of Onset  . Cancer Mother   . Diabetes Mother   . Heart disease Mother   . Cancer Father   . Diabetes Sister   . Heart attack Brother   . Heart disease Brother    History  Substance Use Topics  . Smoking status: Former Smoker -- 0.25 packs/day for 44 years    Types: Cigarettes    Quit date: 04/14/2014  . Smokeless tobacco: Never Used     Comment: smoking 10cigs/week  . Alcohol Use: No     Comment: occasional wine   OB History  No data available     Review of Systems  Constitutional: Negative for appetite change and fatigue.  HENT: Negative for congestion, ear discharge and sinus pressure.   Eyes: Negative for discharge.  Respiratory: Negative for cough.   Cardiovascular: Positive for chest pain.  Gastrointestinal: Positive for vomiting. Negative for abdominal pain and diarrhea.  Genitourinary: Negative for frequency and hematuria.  Musculoskeletal: Negative for back pain.  Skin: Negative for rash.  Neurological: Negative for seizures and headaches.  Psychiatric/Behavioral: Negative for hallucinations.      Allergies  Review of patient's allergies indicates no known allergies.  Home  Medications   Prior to Admission medications   Medication Sig Start Date End Date Taking? Authorizing Provider  amitriptyline (ELAVIL) 100 MG tablet TAKE 1 TABLET (100 MG TOTAL) BY MOUTH AT BEDTIME. Patient taking differently: Take 1 tablet (100 mg total) by mouth at bedtime. 05/09/14  Yes Marletta Lor, MD  betamethasone valerate lotion (VALISONE) 0.1 % Apply 1 application topically as needed.  03/08/14  Yes Historical Provider, MD  HYDROcodone-acetaminophen (NORCO/VICODIN) 5-325 MG per tablet Take 1 tablet by mouth every 6 (six) hours as needed for moderate pain. 06/30/14  Yes Marletta Lor, MD  LORazepam (ATIVAN) 1 MG tablet TAKE 1 TABLET BY MOUTH AT BEDTIME Patient taking differently: Take 1 tablet by mouth at bedtime. 07/09/14  Yes Marletta Lor, MD  omeprazole (PRILOSEC) 20 MG capsule Take 1 capsule (20 mg total) by mouth daily. 06/01/14   Noland Fordyce, PA-C  pantoprazole (PROTONIX) 20 MG tablet Take 1 tablet (20 mg total) by mouth daily. 07/18/14   Maudry Diego, MD  potassium chloride SA (K-DUR,KLOR-CON) 20 MEQ tablet Take 1 tablet (20 mEq total) by mouth daily. 07/18/14   Maudry Diego, MD  sodium chloride (OCEAN) 0.65 % SOLN nasal spray Place 2 sprays into both nostrils as needed for congestion. Patient not taking: Reported on 06/16/2014 06/01/14   Noland Fordyce, PA-C  traMADol (ULTRAM) 50 MG tablet Take 1 tablet (50 mg total) by mouth every 6 (six) hours as needed. Patient taking differently: Take 50 mg by mouth every 6 (six) hours as needed for moderate pain.  05/30/14   Marletta Lor, MD  traZODone (DESYREL) 50 MG tablet Take 1 tablet (50 mg total) by mouth at bedtime. Patient not taking: Reported on 07/18/2014 05/19/14   Marletta Lor, MD   BP 122/60 mmHg  Pulse 70  Temp(Src) 97.7 F (36.5 C) (Oral)  Resp 18  SpO2 100% Physical Exam  Constitutional: She is oriented to person, place, and time. She appears well-developed.  HENT:  Head: Normocephalic.   Eyes: Conjunctivae and EOM are normal. No scleral icterus.  Neck: Neck supple. No thyromegaly present.  Cardiovascular: Normal rate and regular rhythm.  Exam reveals no gallop and no friction rub.   No murmur heard. Pulmonary/Chest: No stridor. She has no wheezes. She has no rales. She exhibits no tenderness.  Abdominal: She exhibits no distension. There is no tenderness. There is no rebound.  Musculoskeletal: Normal range of motion. She exhibits no edema.  Lymphadenopathy:    She has no cervical adenopathy.  Neurological: She is oriented to person, place, and time. She exhibits normal muscle tone. Coordination normal.  Skin: No rash noted. No erythema.  Psychiatric: She has a normal mood and affect. Her behavior is normal.    ED Course  Procedures (including critical care time) Labs Review Labs Reviewed  COMPREHENSIVE METABOLIC PANEL - Abnormal; Notable for the following:  Potassium 2.6 (*)    Glucose, Bld 110 (*)    All other components within normal limits  URINALYSIS, ROUTINE W REFLEX MICROSCOPIC - Abnormal; Notable for the following:    APPearance CLOUDY (*)    Bilirubin Urine SMALL (*)    Protein, ur 100 (*)    All other components within normal limits  URINE MICROSCOPIC-ADD ON - Abnormal; Notable for the following:    Bacteria, UA MANY (*)    Crystals CA OXALATE CRYSTALS (*)    All other components within normal limits  CBC WITH DIFFERENTIAL/PLATELET  LIPASE, BLOOD  I-STAT TROPOININ, ED    Imaging Review Dg Chest 2 View  07/18/2014   CLINICAL DATA:  Chest pain  EXAM: CHEST  2 VIEW  COMPARISON:  06/01/2014  FINDINGS: Heart size upper normal. Negative for heart failure. Surgical clips and scarring left apex unchanged. Lungs otherwise clear.  IMPRESSION: No active cardiopulmonary disease.   Electronically Signed   By: Franchot Gallo M.D.   On: 07/18/2014 13:25     EKG Interpretation   Date/Time:  Friday July 18 2014 11:00:40 EST Ventricular Rate:  92 PR  Interval:  176 QRS Duration: 104 QT Interval:  398 QTC Calculation: 492 R Axis:   39 Text Interpretation:  Sinus rhythm Borderline prolonged QT interval  Confirmed by Lenice Koper  MD, Sherese Heyward [phone]) on 07/18/2014 12:27:19 PM      MDM   Final diagnoses:  Pain  Gastritis  Hypokalemia    Pt improved with gi cocktail,   Will tx with protonix and potassium and follow up     Maudry Diego, MD 07/18/14 1504

## 2014-07-18 NOTE — ED Notes (Signed)
Nurse starting IV 

## 2014-07-18 NOTE — ED Notes (Signed)
Informed RN/MD of abnormal lab

## 2014-07-18 NOTE — Telephone Encounter (Signed)
See other message

## 2014-07-22 ENCOUNTER — Ambulatory Visit
Admission: RE | Admit: 2014-07-22 | Discharge: 2014-07-22 | Disposition: A | Discharge status: Home / Self Care | Payer: 59 | Source: Ambulatory Visit

## 2014-07-22 ENCOUNTER — Ambulatory Visit: Payer: Self-pay

## 2014-07-22 DIAGNOSIS — Z1231 Encounter for screening mammogram for malignant neoplasm of breast: Secondary | ICD-10-CM

## 2014-07-22 DIAGNOSIS — Z85118 Personal history of other malignant neoplasm of bronchus and lung: Secondary | ICD-10-CM

## 2014-07-25 ENCOUNTER — Telehealth: Payer: Self-pay | Admitting: Internal Medicine

## 2014-07-25 NOTE — Telephone Encounter (Signed)
Pt seen at ed last week and given rx potassium chloride SA (K-DUR,KLOR-CON) 20 MEQ tablet Pt request [facility]ill of this [facility]

## 2014-07-25 NOTE — Telephone Encounter (Signed)
Spoke to pt, told her to okay to discontinue Potassium supplement per Dr. Raliegh Ip. Also told her can not order GI cocktail from Hospital, she can sue Maalox OTC for heartburn. Pt verbalized understanding.

## 2014-07-25 NOTE — Telephone Encounter (Signed)
Okay to discontinue potassium supplement

## 2014-07-25 NOTE — Telephone Encounter (Signed)
Patient called back and is also requesting a re-fill on   gi cocktail (Maalox,Lidocaine,Donnatal)

## 2014-07-25 NOTE — Telephone Encounter (Signed)
Dr. Raliegh Ip, pt calling for refill on Potassium that was prescribed in the hospital is pt suppose to continue?

## 2014-07-28 ENCOUNTER — Other Ambulatory Visit (HOSPITAL_COMMUNITY): Payer: Self-pay | Admitting: Neurosurgery

## 2014-08-01 ENCOUNTER — Telehealth: Payer: Self-pay | Admitting: Internal Medicine

## 2014-08-01 MED ORDER — OXYCODONE-ACETAMINOPHEN 5-325 MG PO TABS: 1.0000 | ORAL_TABLET | Freq: Four times a day (QID) | ORAL | Status: DC | PRN

## 2014-08-01 NOTE — Telephone Encounter (Signed)
Pt would like a cb asap and explain the letter from the [facility] center to her. Pt states they sent a copy to dr Raliegh Ip.

## 2014-08-01 NOTE — Telephone Encounter (Signed)
Ok #60 

## 2014-08-01 NOTE — Telephone Encounter (Signed)
Pt request refill of the following: oxyCODONE-acetaminophen (PERCOCET/ROXICET) 5-325 MG per tablet  Pt said injection did not help and her surgery is not till 08/27/14 and is requesting a refill   Phamacy:

## 2014-08-01 NOTE — Telephone Encounter (Signed)
Spoke to pt, went over mammogram letter with pt told her it was normal, no suspicious findings. Pt verbalized understanding.

## 2014-08-01 NOTE — Telephone Encounter (Signed)
Pt notified Rx ready for pickup. Rx printed and signed.  

## 2014-08-01 NOTE — Telephone Encounter (Signed)
Please advise if okay refill Oxycodone? See message below.

## 2014-08-14 ENCOUNTER — Encounter: Payer: Self-pay | Admitting: *Deleted

## 2014-08-14 NOTE — Progress Notes (Signed)
Erroneous encounter

## 2014-08-19 ENCOUNTER — Encounter (HOSPITAL_COMMUNITY)
Admission: RE | Admit: 2014-08-19 | Discharge: 2014-08-19 | Disposition: A | Discharge status: Home / Self Care | Payer: 59 | Source: Ambulatory Visit | Attending: Neurosurgery | Admitting: Neurosurgery

## 2014-08-19 ENCOUNTER — Encounter (HOSPITAL_COMMUNITY): Payer: Self-pay

## 2014-08-19 DIAGNOSIS — K219 Gastro-esophageal reflux disease without esophagitis: Secondary | ICD-10-CM | POA: Diagnosis not present

## 2014-08-19 DIAGNOSIS — E785 Hyperlipidemia, unspecified: Secondary | ICD-10-CM | POA: Insufficient documentation

## 2014-08-19 DIAGNOSIS — M5126 Other intervertebral disc displacement, lumbar region: Secondary | ICD-10-CM | POA: Diagnosis not present

## 2014-08-19 DIAGNOSIS — Z01812 Encounter for preprocedural laboratory examination: Secondary | ICD-10-CM | POA: Insufficient documentation

## 2014-08-19 HISTORY — DX: Personal history of other diseases of the respiratory system: Z87.09

## 2014-08-19 HISTORY — DX: Dorsalgia, unspecified: M54.9

## 2014-08-19 HISTORY — DX: Personal history of colon polyps, unspecified: Z86.0100

## 2014-08-19 HISTORY — DX: Insomnia, unspecified: G47.00

## 2014-08-19 HISTORY — DX: Personal history of colonic polyps: Z86.010

## 2014-08-19 HISTORY — DX: Noninfective gastroenteritis and colitis, unspecified: K52.9

## 2014-08-19 HISTORY — DX: Other chronic pain: G89.29

## 2014-08-19 HISTORY — DX: Paresthesia of skin: R20.2

## 2014-08-19 LAB — BASIC METABOLIC PANEL
Anion gap: 4 — ABNORMAL LOW (ref 5–15)
BUN: 6 mg/dL (ref 6–23)
CALCIUM: 10.5 mg/dL (ref 8.4–10.5)
CHLORIDE: 108 mmol/L (ref 96–112)
CO2: 29 mmol/L (ref 19–32)
CREATININE: 0.74 mg/dL (ref 0.50–1.10)
GFR calc Af Amer: >90 mL/min (ref >90)
GFR calc non Af Amer: >90 mL/min (ref >90)
GLUCOSE: 105 mg/dL — AB (ref 70–99)
Potassium: 4.7 mmol/L (ref 3.5–5.1)
Sodium: 141 mmol/L (ref 135–145)

## 2014-08-19 LAB — SURGICAL PCR SCREEN
MRSA, PCR: POSITIVE — AB
Staphylococcus aureus: POSITIVE — AB

## 2014-08-19 LAB — CBC
HEMATOCRIT: 37.5 % (ref 36.0–46.0)
Hemoglobin: 11.9 g/dL — ABNORMAL LOW (ref 12.0–15.0)
MCH: 29.4 pg (ref 26.0–34.0)
MCHC: 31.7 g/dL (ref 30.0–36.0)
MCV: 92.6 fL (ref 78.0–100.0)
Platelets: 302 10*3/uL (ref 150–400)
RBC: 4.05 MIL/uL (ref 3.87–5.11)
RDW: 15.5 % (ref 11.5–15.5)
WBC: 8.9 10*3/uL (ref 4.0–10.5)

## 2014-08-19 NOTE — Pre-Procedure Instructions (Signed)
IVYONNA HOELZEL  08/19/2014   Your procedure is scheduled on:  Wed, April 6 @ 3:45 PM  Report to [facility]  at 12:45 PM.  Call this number if you have problems the morning of surgery: [phone]   Remember:   Do not eat food or drink liquids after midnight.   Take these medicines the morning of surgery with A SIP OF WATER: Pain Pill(if needed)              No Goody's,BC's,Aleve,Aspirin,Ibuprofen,Fish Oil,or any Herbal Medications.    Do not wear jewelry, make-up or nail polish.  Do not wear lotions, powders, or perfumes. You may wear deodorant.  Do not shave 48 hours prior to surgery.   Do not bring valuables to the hospital.  [facility] is not responsible                  for any belongings or valuables.               Contacts, dentures or bridgework may not be worn into surgery.  Leave suitcase in the car. After surgery it may be brought to your room.  For patients admitted to the hospital, discharge time is determined by your                treatment team.                  Special Instructions:  [place] - Preparing for Surgery  Before surgery, you can play an important role.  Because skin is not sterile, your skin needs to be as free of germs as possible.  You can reduce the number of germs on you skin by washing with CHG (chlorahexidine gluconate) soap before surgery.  CHG is an antiseptic cleaner which kills germs and bonds with the skin to continue killing germs even after washing.  Please DO NOT use if you have an allergy to CHG or antibacterial soaps.  If your skin becomes reddened/irritated stop using the CHG and inform your nurse when you arrive at Short Stay.  Do not shave (including legs and underarms) for at least 48 hours prior to the first CHG shower.  You may shave your face.  Please follow these instructions carefully:   1.  Shower with CHG Soap the night before surgery and the                                morning of Surgery.  2.  If you  choose to wash your hair, wash your hair first as usual with your       normal shampoo.  3.  After you shampoo, rinse your hair and body thoroughly to remove the                      Shampoo.  4.  Use CHG as you would any other liquid soap.  You can apply chg directly       to the skin and wash gently with scrungie or a clean washcloth.  5.  Apply the CHG Soap to your body ONLY FROM THE NECK DOWN.        Do not use on open wounds or open sores.  Avoid contact with your eyes,       ears, mouth and genitals (private parts).  Wash genitals (private parts)       with your  normal soap.  6.  Wash thoroughly, paying special attention to the area where your surgery        will be performed.  7.  Thoroughly rinse your body with warm water from the neck down.  8.  DO NOT shower/wash with your normal soap after using and rinsing off       the CHG Soap.  9.  Pat yourself dry with a clean towel.            10.  Wear clean pajamas.            11.  Place clean sheets on your bed the night of your first shower and do not        sleep with pets.  Day of Surgery  Do not apply any lotions/deoderants the morning of surgery.  Please wear clean clothes to the hospital/surgery center.     Please read over the following fact sheets that you were given: Pain Booklet, Coughing and Deep Breathing, MRSA Information and Surgical Site Infection Prevention

## 2014-08-19 NOTE — Progress Notes (Signed)
I called a prescription for Mupirocin ointment to [facility] [address], [place], [place].

## 2014-08-19 NOTE — Progress Notes (Addendum)
Echo reports in epic from 2005/2015  Stress test reports in epic from 2010/2015  EKG in epic from 07-18-14  Dr.Peter Burnice Logan is Medical Md  Denies ever having a heart cath  CXR in epic from 07-18-14

## 2014-08-20 NOTE — Progress Notes (Signed)
Anesthesia Chart Review: Patient is a 59 year old female scheduled for right L5-S1 microdiskectomy on 08/27/14 by Dr. Arnoldo Morale.  History includes former smoker hyperlipidemia, migraines, anxiety, GERD, heart murmur (mild to moderate AR, mild MR by 09/2013 echo), hemoptysis '10, laparoscopic gastric banding, hysterectomy, left VATS and LUL wedge resection 10/28/13 showing stage I lung adenocarcinoma. BMI is consistent with obesity.  Of note, her lung procedure was initially scheduled for 08/26/13 but she aspirated a large amount of gastric contents on induction. Bronchoscopy was performed to clear airway but it was felt best not to proceed with lung resection at that time. She had reportedly been NPO since 10 PM the night before surgery, so it was thought she may have had significant reflux. The anesthesiologist at the time recommended clear liquids 24 hours preoperatively. However, notes later indicate that fluid had been placed in her lap band just days before surgery. She was referred to general surgeon Dr. Hassell Done and fluid was removed from the band to minimize effects on anesthesia. Prior to that surgery she was allowed CL after after lunch the day before her last surgery and then NPO after midnight. This procedure is not scheduled until 1545.   PCP is Dr. Salli Real. Pulmonologist is Dr. Chase Caller. She was seen by cardiologist Dr. Gwenlyn Found in 2015 as part of a pre-operative evaluation.   EKG on 07/18/14 showed NSR, borderline prolonged QT.   Nuclear stress test on 10/15/13 was normal, EF 59%.   Echo on 10/15/13 showed: - Left ventricle: The cavity size was normal. Wall thickness was normal. Systolic function was normal. The estimated ejection fraction was in the range of 55% to 60%. Wall motion was normal; there were no regional wall motion abnormalities. Doppler parameters are consistent with abnormal left ventricular relaxation (grade 1 diastolic dysfunction). - Aortic valve: There was mild to moderate  regurgitation. - Mitral valve: There was mild regurgitation.  PFT 05/09/14  FVC 2.47 (104%)  FEV1 1.96 (105%)  DLCO 82%   07/18/14 CXR: No active cardiopulmonary disease.  Preoperative labs ntoed.   I reviewed aspiration with induction history in 08/2013 with anesthesiologist Dr. Linna Caprice.  Since surgery is not currently scheduled until 1545, he felt patient could have clear liquids up to 8AM on the morning of surgery. I notified Manuela Schwartz at Dr. Arnoldo Morale' office of Dr. Verneda Skill recommendations. I called to talk with patient.  She reports reflux symptoms are controlled.  She had liquid removed from her lap band within the past 30 days.  I notified patient that IF her surgery time was not moved to an earlier time than she would be allowed to drink clear liquids up to 8AM on the morning of surgery.  I reviewed examples of clear liquids with patient, and notified her that straying from these instructions could interfere with plans for surgery due to specific time frames needed for NPO status based on the type of food or liquid ingested. She verbalized understanding.  If no acute changes then I would anticipate that she could proceed as planned.  [facility] Short Stay Center/Anesthesiology Phone [phone] 08/20/2014 12:01 PM

## 2014-08-26 MED ORDER — CEFAZOLIN SODIUM-DEXTROSE 2-3 GM-% IV SOLR
2.0000 g | INTRAVENOUS | Status: AC
  Administered 2014-08-27: 2 g via INTRAVENOUS
  Filled 2014-08-26: qty 50

## 2014-08-27 ENCOUNTER — Ambulatory Visit (HOSPITAL_COMMUNITY)
Admission: RE | Admit: 2014-08-27 | Discharge: 2014-08-28 | Disposition: A | Discharge status: Home / Self Care | Payer: 59 | Source: Ambulatory Visit | Attending: Neurosurgery | Admitting: Neurosurgery

## 2014-08-27 ENCOUNTER — Encounter (HOSPITAL_COMMUNITY)
Admission: RE | Disposition: A | Discharge status: Home / Self Care | Payer: Self-pay | Source: Ambulatory Visit | Attending: Neurosurgery

## 2014-08-27 ENCOUNTER — Ambulatory Visit (HOSPITAL_COMMUNITY): Payer: 59 | Admitting: Anesthesiology

## 2014-08-27 ENCOUNTER — Encounter (HOSPITAL_COMMUNITY): Payer: Self-pay | Admitting: *Deleted

## 2014-08-27 ENCOUNTER — Ambulatory Visit (HOSPITAL_COMMUNITY): Payer: 59 | Admitting: Vascular Surgery

## 2014-08-27 ENCOUNTER — Ambulatory Visit (HOSPITAL_COMMUNITY): Payer: 59

## 2014-08-27 DIAGNOSIS — Z85118 Personal history of other malignant neoplasm of bronchus and lung: Secondary | ICD-10-CM | POA: Diagnosis not present

## 2014-08-27 DIAGNOSIS — J449 Chronic obstructive pulmonary disease, unspecified: Secondary | ICD-10-CM | POA: Diagnosis not present

## 2014-08-27 DIAGNOSIS — Z6839 Body mass index (BMI) 39.0-39.9, adult: Secondary | ICD-10-CM | POA: Insufficient documentation

## 2014-08-27 DIAGNOSIS — Z86010 Personal history of colonic polyps: Secondary | ICD-10-CM | POA: Insufficient documentation

## 2014-08-27 DIAGNOSIS — M1990 Unspecified osteoarthritis, unspecified site: Secondary | ICD-10-CM | POA: Insufficient documentation

## 2014-08-27 DIAGNOSIS — Z87891 Personal history of nicotine dependence: Secondary | ICD-10-CM | POA: Insufficient documentation

## 2014-08-27 DIAGNOSIS — K219 Gastro-esophageal reflux disease without esophagitis: Secondary | ICD-10-CM | POA: Insufficient documentation

## 2014-08-27 DIAGNOSIS — G4700 Insomnia, unspecified: Secondary | ICD-10-CM | POA: Diagnosis not present

## 2014-08-27 DIAGNOSIS — M5116 Intervertebral disc disorders with radiculopathy, lumbar region: Secondary | ICD-10-CM | POA: Insufficient documentation

## 2014-08-27 DIAGNOSIS — Z419 Encounter for procedure for purposes other than remedying health state, unspecified: Secondary | ICD-10-CM

## 2014-08-27 DIAGNOSIS — M5126 Other intervertebral disc displacement, lumbar region: Secondary | ICD-10-CM | POA: Diagnosis present

## 2014-08-27 HISTORY — PX: LUMBAR LAMINECTOMY/DECOMPRESSION MICRODISCECTOMY: SHX5026

## 2014-08-27 SURGERY — LUMBAR LAMINECTOMY/DECOMPRESSION MICRODISCECTOMY 1 LEVEL
Anesthesia: General | Site: Back | Laterality: Right

## 2014-08-27 MED ORDER — PROPOFOL 10 MG/ML IV BOLUS
INTRAVENOUS | Status: DC | PRN
  Administered 2014-08-27: 100 mg via INTRAVENOUS

## 2014-08-27 MED ORDER — LIDOCAINE HCL (CARDIAC) 20 MG/ML IV SOLN
INTRAVENOUS | Status: DC | PRN
  Administered 2014-08-27: 50 mg via INTRAVENOUS

## 2014-08-27 MED ORDER — HYDROMORPHONE HCL 1 MG/ML IJ SOLN
0.2500 mg | INTRAMUSCULAR | Status: DC | PRN
  Administered 2014-08-27 (×3): 0.5 mg via INTRAVENOUS

## 2014-08-27 MED ORDER — FENTANYL CITRATE 0.05 MG/ML IJ SOLN
INTRAMUSCULAR | Status: AC
  Filled 2014-08-27: qty 5

## 2014-08-27 MED ORDER — MIDAZOLAM HCL 2 MG/2ML IJ SOLN
INTRAMUSCULAR | Status: AC
  Filled 2014-08-27: qty 2

## 2014-08-27 MED ORDER — SUFENTANIL CITRATE 50 MCG/ML IV SOLN
INTRAVENOUS | Status: AC
  Filled 2014-08-27: qty 1

## 2014-08-27 MED ORDER — AMITRIPTYLINE HCL 100 MG PO TABS
100.0000 mg | ORAL_TABLET | Freq: Every day | ORAL | Status: DC
  Administered 2014-08-27: 100 mg via ORAL
  Filled 2014-08-27 (×2): qty 1

## 2014-08-27 MED ORDER — OXYCODONE-ACETAMINOPHEN 5-325 MG PO TABS
1.0000 | ORAL_TABLET | ORAL | Status: DC | PRN
  Administered 2014-08-27 – 2014-08-28 (×3): 2 via ORAL
  Filled 2014-08-27 (×3): qty 2

## 2014-08-27 MED ORDER — NEOSTIGMINE METHYLSULFATE 10 MG/10ML IV SOLN
INTRAVENOUS | Status: AC
  Filled 2014-08-27: qty 2

## 2014-08-27 MED ORDER — ACETAMINOPHEN 325 MG PO TABS: 650.0000 mg | ORAL_TABLET | ORAL | Status: DC | PRN

## 2014-08-27 MED ORDER — MENTHOL 3 MG MT LOZG: 1.0000 | LOZENGE | OROMUCOSAL | Status: DC | PRN

## 2014-08-27 MED ORDER — ACETAMINOPHEN 650 MG RE SUPP: 650.0000 mg | RECTAL | Status: DC | PRN

## 2014-08-27 MED ORDER — GLYCOPYRROLATE 0.2 MG/ML IJ SOLN
INTRAMUSCULAR | Status: AC
  Filled 2014-08-27: qty 4

## 2014-08-27 MED ORDER — VECURONIUM BROMIDE 10 MG IV SOLR
INTRAVENOUS | Status: DC | PRN
  Administered 2014-08-27: 1 mg via INTRAVENOUS

## 2014-08-27 MED ORDER — HEMOSTATIC AGENTS (NO CHARGE) OPTIME
TOPICAL | Status: DC | PRN
  Administered 2014-08-27: 1 via TOPICAL

## 2014-08-27 MED ORDER — THROMBIN 5000 UNITS EX SOLR
CUTANEOUS | Status: DC | PRN
  Administered 2014-08-27 (×2): 5000 [IU] via TOPICAL

## 2014-08-27 MED ORDER — DIAZEPAM 5 MG PO TABS: 5.0000 mg | ORAL_TABLET | Freq: Four times a day (QID) | ORAL | Status: DC | PRN

## 2014-08-27 MED ORDER — GLYCOPYRROLATE 0.2 MG/ML IJ SOLN
INTRAMUSCULAR | Status: DC | PRN
  Administered 2014-08-27: .8 mg via INTRAVENOUS

## 2014-08-27 MED ORDER — HYDROMORPHONE HCL 1 MG/ML IJ SOLN
INTRAMUSCULAR | Status: AC
  Filled 2014-08-27: qty 1

## 2014-08-27 MED ORDER — MORPHINE SULFATE 2 MG/ML IJ SOLN: 1.0000 mg | INTRAMUSCULAR | Status: DC | PRN

## 2014-08-27 MED ORDER — SUCCINYLCHOLINE CHLORIDE 20 MG/ML IJ SOLN
INTRAMUSCULAR | Status: DC | PRN
  Administered 2014-08-27: 140 mg via INTRAVENOUS

## 2014-08-27 MED ORDER — SODIUM CHLORIDE 0.9 % IR SOLN
Status: DC | PRN
  Administered 2014-08-27: 12:00:00

## 2014-08-27 MED ORDER — LACTATED RINGERS IV SOLN: INTRAVENOUS | Status: DC

## 2014-08-27 MED ORDER — CHLORHEXIDINE GLUCONATE CLOTH 2 % EX PADS
6.0000 | MEDICATED_PAD | Freq: Every day | CUTANEOUS | Status: DC
  Administered 2014-08-28: 6 via TOPICAL

## 2014-08-27 MED ORDER — CEFAZOLIN SODIUM-DEXTROSE 2-3 GM-% IV SOLR
2.0000 g | Freq: Three times a day (TID) | INTRAVENOUS | Status: AC
  Administered 2014-08-27 – 2014-08-28 (×2): 2 g via INTRAVENOUS
  Filled 2014-08-27 (×2): qty 50

## 2014-08-27 MED ORDER — ONDANSETRON HCL 4 MG/2ML IJ SOLN: 4.0000 mg | INTRAMUSCULAR | Status: DC | PRN

## 2014-08-27 MED ORDER — LIDOCAINE HCL (CARDIAC) 20 MG/ML IV SOLN
INTRAVENOUS | Status: AC
  Filled 2014-08-27: qty 5

## 2014-08-27 MED ORDER — PROPOFOL 10 MG/ML IV BOLUS
INTRAVENOUS | Status: AC
  Filled 2014-08-27: qty 20

## 2014-08-27 MED ORDER — BUPIVACAINE-EPINEPHRINE (PF) 0.5% -1:200000 IJ SOLN
INTRAMUSCULAR | Status: DC | PRN
  Administered 2014-08-27 (×2): 10 mL

## 2014-08-27 MED ORDER — PROMETHAZINE HCL 25 MG/ML IJ SOLN: 6.2500 mg | INTRAMUSCULAR | Status: DC | PRN

## 2014-08-27 MED ORDER — LACTATED RINGERS IV SOLN
INTRAVENOUS | Status: DC
  Administered 2014-08-27 (×2): via INTRAVENOUS

## 2014-08-27 MED ORDER — HYDROCODONE-ACETAMINOPHEN 5-325 MG PO TABS: 1.0000 | ORAL_TABLET | ORAL | Status: DC | PRN

## 2014-08-27 MED ORDER — PHENYLEPHRINE HCL 10 MG/ML IJ SOLN
INTRAMUSCULAR | Status: DC | PRN
  Administered 2014-08-27: 80 ug via INTRAVENOUS

## 2014-08-27 MED ORDER — ONDANSETRON HCL 4 MG/2ML IJ SOLN
INTRAMUSCULAR | Status: DC | PRN
Start: 2014-08-27 — End: 2014-08-27
  Administered 2014-08-27: 4 mg via INTRAVENOUS

## 2014-08-27 MED ORDER — BACITRACIN ZINC 500 UNIT/GM EX OINT
TOPICAL_OINTMENT | CUTANEOUS | Status: DC | PRN
  Administered 2014-08-27: 1 via TOPICAL

## 2014-08-27 MED ORDER — ALUM & MAG HYDROXIDE-SIMETH 200-200-20 MG/5ML PO SUSP: 30.0000 mL | Freq: Four times a day (QID) | ORAL | Status: DC | PRN

## 2014-08-27 MED ORDER — 0.9 % SODIUM CHLORIDE (POUR BTL) OPTIME
TOPICAL | Status: DC | PRN
  Administered 2014-08-27: 1000 mL

## 2014-08-27 MED ORDER — ONDANSETRON HCL 4 MG/2ML IJ SOLN
INTRAMUSCULAR | Status: AC
  Filled 2014-08-27: qty 2

## 2014-08-27 MED ORDER — MIDAZOLAM HCL 5 MG/5ML IJ SOLN
INTRAMUSCULAR | Status: DC | PRN
  Administered 2014-08-27: 2 mg via INTRAVENOUS

## 2014-08-27 MED ORDER — NEOSTIGMINE METHYLSULFATE 10 MG/10ML IV SOLN
INTRAVENOUS | Status: DC | PRN
  Administered 2014-08-27: 5 mg via INTRAVENOUS

## 2014-08-27 MED ORDER — FENTANYL CITRATE 0.05 MG/ML IJ SOLN
INTRAMUSCULAR | Status: DC | PRN
  Administered 2014-08-27 (×2): 50 ug via INTRAVENOUS

## 2014-08-27 MED ORDER — PHENOL 1.4 % MT LIQD: 1.0000 | OROMUCOSAL | Status: DC | PRN

## 2014-08-27 MED ORDER — ROCURONIUM BROMIDE 50 MG/5ML IV SOLN
INTRAVENOUS | Status: AC
  Filled 2014-08-27: qty 1

## 2014-08-27 SURGICAL SUPPLY — 55 items
BAG DECANTER FOR FLEXI CONT (MISCELLANEOUS) ×3 IMPLANT
BENZOIN TINCTURE PRP APPL 2/3 (GAUZE/BANDAGES/DRESSINGS) ×3 IMPLANT
BLADE CLIPPER SURG (BLADE) IMPLANT
BRUSH SCRUB EZ PLAIN DRY (MISCELLANEOUS) ×3 IMPLANT
BUR MATCHSTICK NEURO 3.0 LAGG (BURR) ×3 IMPLANT
BUR PRECISION FLUTE 6.0 (BURR) ×3 IMPLANT
CANISTER SUCT 3000ML PPV (MISCELLANEOUS) ×3 IMPLANT
CLOSURE WOUND 1/2 X4 (GAUZE/BANDAGES/DRESSINGS) ×1
CONT SPEC 4OZ CLIKSEAL STRL BL (MISCELLANEOUS) ×3 IMPLANT
DRAPE LAPAROTOMY 100X72X124 (DRAPES) ×3 IMPLANT
DRAPE MICROSCOPE LEICA (MISCELLANEOUS) ×3 IMPLANT
DRAPE POUCH INSTRU U-SHP 10X18 (DRAPES) ×3 IMPLANT
DRAPE SURG 17X23 STRL (DRAPES) ×12 IMPLANT
ELECT BLADE 4.0 EZ CLEAN MEGAD (MISCELLANEOUS) ×3
ELECT REM PT RETURN 9FT ADLT (ELECTROSURGICAL) ×3
ELECTRODE BLDE 4.0 EZ CLN MEGD (MISCELLANEOUS) ×1 IMPLANT
ELECTRODE REM PT RTRN 9FT ADLT (ELECTROSURGICAL) ×1 IMPLANT
GAUZE SPONGE 4X4 12PLY STRL (GAUZE/BANDAGES/DRESSINGS) ×3 IMPLANT
GAUZE SPONGE 4X4 16PLY XRAY LF (GAUZE/BANDAGES/DRESSINGS) IMPLANT
GLOVE BIO SURGEON STRL SZ8 (GLOVE) ×6 IMPLANT
GLOVE BIO SURGEON STRL SZ8.5 (GLOVE) ×3 IMPLANT
GLOVE BIOGEL PI IND STRL 7.5 (GLOVE) ×1 IMPLANT
GLOVE BIOGEL PI INDICATOR 7.5 (GLOVE) ×2
GLOVE ECLIPSE 7.5 STRL STRAW (GLOVE) ×6 IMPLANT
GLOVE EXAM NITRILE LRG STRL (GLOVE) IMPLANT
GLOVE EXAM NITRILE MD LF STRL (GLOVE) IMPLANT
GLOVE EXAM NITRILE XL STR (GLOVE) IMPLANT
GLOVE EXAM NITRILE XS STR PU (GLOVE) IMPLANT
GLOVE INDICATOR 8.5 STRL (GLOVE) ×3 IMPLANT
GLOVE SURG SS PI 7.0 STRL IVOR (GLOVE) ×6 IMPLANT
GOWN STRL REUS W/ TWL LRG LVL3 (GOWN DISPOSABLE) IMPLANT
GOWN STRL REUS W/ TWL XL LVL3 (GOWN DISPOSABLE) ×4 IMPLANT
GOWN STRL REUS W/TWL 2XL LVL3 (GOWN DISPOSABLE) IMPLANT
GOWN STRL REUS W/TWL LRG LVL3 (GOWN DISPOSABLE)
GOWN STRL REUS W/TWL XL LVL3 (GOWN DISPOSABLE) ×8
KIT BASIN OR (CUSTOM PROCEDURE TRAY) ×3 IMPLANT
KIT ROOM TURNOVER OR (KITS) ×3 IMPLANT
NEEDLE HYPO 21X1.5 SAFETY (NEEDLE) IMPLANT
NEEDLE HYPO 22GX1.5 SAFETY (NEEDLE) ×3 IMPLANT
NS IRRIG 1000ML POUR BTL (IV SOLUTION) ×3 IMPLANT
PACK LAMINECTOMY NEURO (CUSTOM PROCEDURE TRAY) ×3 IMPLANT
PAD ARMBOARD 7.5X6 YLW CONV (MISCELLANEOUS) ×9 IMPLANT
PATTIES SURGICAL .5 X1 (DISPOSABLE) IMPLANT
RUBBERBAND STERILE (MISCELLANEOUS) ×6 IMPLANT
SPONGE SURGIFOAM ABS GEL SZ50 (HEMOSTASIS) ×3 IMPLANT
STRIP CLOSURE SKIN 1/2X4 (GAUZE/BANDAGES/DRESSINGS) ×2 IMPLANT
SUT VIC AB 1 CT1 18XBRD ANBCTR (SUTURE) ×1 IMPLANT
SUT VIC AB 1 CT1 8-18 (SUTURE) ×2
SUT VIC AB 2-0 CP2 18 (SUTURE) ×3 IMPLANT
SYR 20CC LL (SYRINGE) IMPLANT
SYR 20ML ECCENTRIC (SYRINGE) ×3 IMPLANT
TAPE CLOTH SURG 4X10 WHT LF (GAUZE/BANDAGES/DRESSINGS) ×3 IMPLANT
TOWEL OR 17X24 6PK STRL BLUE (TOWEL DISPOSABLE) ×3 IMPLANT
TOWEL OR 17X26 10 PK STRL BLUE (TOWEL DISPOSABLE) ×3 IMPLANT
WATER STERILE IRR 1000ML POUR (IV SOLUTION) ×3 IMPLANT

## 2014-08-27 NOTE — Op Note (Signed)
Brief history: The patient is a 59 year old black female who has complained of back and right leg pain insistent with a lumbar radiculopathy. She has failed medical management and was worked up with a lumbar MRI. This demonstrated a herniated disc at L5-S1 on the right. I discussed the various treatment options including surgery. The patient has weighed the risks, benefits, and alternative surgery and decided proceed with a right L5-S1 discectomy.  Preoperative diagnosis: Right L5-S1 herniated disc, lumbago, lumbar radiculopathy  Postoperative diagnosis: The same  Procedure: Right L5-S1 Intervertebral discectomy using micro-dissection  Surgeon: Dr. Earle Gell  Asst.: Dr. Francesca Jewett  Anesthesia: Gen. endotracheal  Estimated blood loss: Minimal  Drains: None  Complications: None  Description of procedure: The patient was brought to the operating room by the anesthesia team. General endotracheal anesthesia was induced. The patient was turned to the prone position on the Wilson frame. The patient's lumbosacral region was then prepared with Betadine scrub and Betadine solution. Sterile drapes were applied.  I then injected the area to be incised with Marcaine with epinephrine solution. I then used a scalpel to make a linear midline incision over the L5-S1 intervertebral disc space. I then used electrocautery to perform a right sided subperiosteal dissection exposing the spinous process and lamina of L5 and S1. We obtained intraoperative radiograph to confirm our location. I then inserted the [facility] retractor for exposure.  We then brought the operative microscope into the field. Under its magnification and illumination we completed the microdissection. I used a high-speed drill to perform a laminotomy at L5 on the right. I then used a Kerrison punches to widen the laminotomy and removed the ligamentum flavum at L5-S1 on the right. We then used microdissection to free up the thecal sac and the  right S1 nerve root from the epidural tissue. I then used a Kerrison punch to perform a foraminotomy at about the right S1 nerve root. We then using the nerve root retractor to gently retract the thecal sac and the right S1 nerve root medially. This exposed the intervertebral disc. We identified the ruptured disc and remove it with the pituitary forceps. I performed a partial intervertebral discectomy with a pituitary forceps.  I then palpated along the ventral surface of the thecal sac and along exit route of the right S1 nerve root and noted that the neural structures were well decompressed. This completed the decompression.  We then obtained hemostasis using bipolar electrocautery. We irrigated the wound out with bacitracin solution. We then removed the retractor. We then reapproximated the patient's thoracolumbar fascia with interrupted #1 Vicryl suture. We then reapproximated the patient's subcutaneous tissue with interrupted 2-0 Vicryl suture. We then reapproximated patient's skin with Steri-Strips and benzoin. The was then coated with bacitracin ointment. The drapes were removed. The patient was subsequently returned to the supine position where they were extubated by the anesthesia team. The patient was then transported to the postanesthesia care unit in stable condition. All sponge instrument and needle counts were reportedly correct at the end of this case.

## 2014-08-27 NOTE — H&P (Signed)
Subjective: The patient is a 59 year old black female who has complained of right leg pain consistent with a lumbar radiculopathy. She has failed medical management and was worked up with a lumbar MRI. This demonstrated herniated disc at L5-S1 on the right. I discussed the various treatment options with patient including surgery. She has decided to proceed with a right L5-S1 discectomy.   Past Medical History  Diagnosis Date  .   07/20/2006  . Heart murmur   . Lung cancer   . COPD (chronic obstructive pulmonary disease)   . Arthritis   . Adenocarcinoma of lung, stage 1 08/26/2013  . Insomnia     takes Elavil niglty  . Pneumonia 6 months ago  . History of bronchitis about 8 months ago  . Tingling     right  . Chronic back pain     herniated disc  . GERD (gastroesophageal reflux disease)     takes Mylanta or Pepto as needed  . History of colon polyps   . Colitis     Past Surgical History  Procedure Laterality Date  . Laparoscopic gastric banding  ~ 2006  . Colonoscopy    . Bronchoscopy  08/26/2013  . Abdominal hysterectomy  1990's  . Tubal ligation  1980's  . Shoulder arthroscopy w/ rotator cuff repair Right 2014  . Wrist fracture surgery Left ~ 2010  . Video bronchoscopy N/A 08/26/2013    Procedure: VIDEO BRONCHOSCOPY;  Surgeon: Melrose Nakayama, MD;  Location: [place];  Service: Thoracic;  Laterality: N/A;  . Video bronchoscopy N/A 10/28/2013    Procedure: VIDEO BRONCHOSCOPY;  Surgeon: [name], MD;  Location: [facility] OR;  Service: Thoracic;  Laterality: N/A;  . Video assisted thoracoscopy (vats)/wedge resection Left 10/28/2013    Procedure: VIDEO ASSISTED THORACOSCOPY (VATS) lung left,  mini left thorocotomy, wedge resection left upper lobe nodule, lymph node dissection.;  Surgeon: [name], MD;  Location: [place];  Service: Thoracic;  Laterality: Left;    No Known Allergies  History  Substance Use Topics  . Smoking status: Former Smoker -- 0.25 packs/day for 44 years     Types: Cigarettes  . Smokeless tobacco: Never Used     Comment: quit smoking in Nov 2015  . Alcohol Use: No     Comment: occasional wine    Family History  Problem Relation Age of Onset  . Cancer Mother   . Diabetes Mother   . Heart disease Mother   . Cancer Father   . Diabetes Sister   . Heart attack Brother   . Heart disease Brother    Prior to Admission medications   Medication Sig Start Date End Date Taking? Authorizing Provider  amitriptyline (ELAVIL) 100 MG tablet TAKE 1 TABLET (100 MG TOTAL) BY MOUTH AT BEDTIME. Patient taking differently: Take 1 tablet (100 mg total) by mouth at bedtime. 05/09/14  Yes Marletta Lor, MD  betamethasone valerate lotion (VALISONE) 0.1 % Apply 1 application topically as needed for irritation.  03/08/14  Yes Historical Provider, MD  oxyCODONE-acetaminophen (PERCOCET/ROXICET) 5-325 MG per tablet Take 1 tablet by mouth every 6 (six) hours as needed for severe pain. 08/01/14  Yes Marletta Lor, MD  traMADol (ULTRAM) 50 MG tablet Take 1 tablet (50 mg total) by mouth every 6 (six) hours as needed. Patient taking differently: Take 50 mg by mouth every 6 (six) hours as needed for moderate pain.  05/30/14  Yes Marletta Lor, MD  HYDROcodone-acetaminophen (NORCO/VICODIN) 5-325 MG per tablet  Take 1 tablet by mouth every 6 (six) hours as needed for moderate pain. Patient not taking: Reported on 08/15/2014 06/30/14   Marletta Lor, MD  LORazepam (ATIVAN) 1 MG tablet TAKE 1 TABLET BY MOUTH AT BEDTIME Patient not taking: Reported on 08/15/2014 07/09/14   Marletta Lor, MD  omeprazole (PRILOSEC) 20 MG capsule Take 1 capsule (20 mg total) by mouth daily. Patient not taking: Reported on 08/15/2014 06/01/14   Noland Fordyce, PA-C  ondansetron (ZOFRAN ODT) 4 MG disintegrating tablet 4mg  ODT q4 hours prn nausea/vomit Patient not taking: Reported on 08/15/2014 07/18/14   Milton Ferguson, MD  pantoprazole (PROTONIX) 20 MG tablet Take 1 tablet (20 mg  total) by mouth daily. Patient not taking: Reported on 08/15/2014 07/18/14   Milton Ferguson, MD  potassium chloride SA (K-DUR,KLOR-CON) 20 MEQ tablet Take 1 tablet (20 mEq total) by mouth daily. Patient not taking: Reported on 08/15/2014 07/18/14   Milton Ferguson, MD  sodium chloride (OCEAN) 0.65 % SOLN nasal spray Place 2 sprays into both nostrils as needed for congestion. Patient not taking: Reported on 06/16/2014 06/01/14   Noland Fordyce, PA-C  traZODone (DESYREL) 50 MG tablet Take 1 tablet (50 mg total) by mouth at bedtime. Patient not taking: Reported on 07/18/2014 05/19/14   Marletta Lor, MD     Review of Systems  Positive ROS: As above  All other systems have been reviewed and were otherwise negative with the exception of those mentioned in the HPI and as above.  Objective: Vital signs in last 24 hours: Temp:  [98.5 F (36.9 C)] 98.5 F (36.9 C) (04/06 0810) Pulse Rate:  [70] 70 (04/06 0810) Resp:  [18] 18 (04/06 0810) BP: (132)/(72) 132/72 mmHg (04/06 0810) SpO2:  [99 %] 99 % (04/06 0810) Weight:  [94.547 kg (208 lb 7 oz)] 94.547 kg (208 lb 7 oz) (04/06 0810)  General Appearance: Alert, cooperative, no distress, Head: Normocephalic, without obvious abnormality, atraumatic Eyes: PERRL, conjunctiva/corneas clear, EOM's intact,    Ears: Normal  Throat: Normal  Neck: Supple, symmetrical, trachea midline, no adenopathy; thyroid: No enlargement/tenderness/nodules; no carotid bruit or JVD Back: Symmetric, no curvature, ROM normal, no CVA tenderness Lungs: Clear to auscultation bilaterally, respirations unlabored Heart: Regular rate and rhythm, no murmur, rub or gallop Abdomen: Soft, non-tender,, no masses, no organomegaly Extremities: Extremities normal, atraumatic, no cyanosis or edema Pulses: 2+ and symmetric all extremities Skin: Skin color, texture, turgor normal, no rashes or lesions  NEUROLOGIC:   Mental status: alert and oriented, no aphasia, good attention span, Fund  of knowledge/ memory ok Motor Exam - grossly normal Sensory Exam - grossly normal Reflexes:  Coordination - grossly normal Gait - grossly normal Balance - grossly normal Cranial Nerves: I: smell Not tested  II: visual acuity  OS: Normal  OD: Normal   II: visual fields Full to confrontation  II: pupils Equal, round, reactive to light  III,VII: ptosis None  III,IV,VI: extraocular muscles  Full ROM  V: mastication Normal  V: facial light touch sensation  Normal  V,VII: corneal reflex  Present  VII: facial muscle function - upper  Normal  VII: facial muscle function - lower Normal  VIII: hearing Not tested  IX: soft palate elevation  Normal  IX,X: gag reflex Present  XI: trapezius strength  5/5  XI: sternocleidomastoid strength 5/5  XI: neck flexion strength  5/5  XII: tongue strength  Normal    Data Review Lab Results  Component Value Date   WBC 8.9 08/19/2014  HGB 11.9* 08/19/2014   HCT 37.5 08/19/2014   MCV 92.6 08/19/2014   PLT 302 08/19/2014   Lab Results  Component Value Date   NA 141 08/19/2014   K 4.7 08/19/2014   CL 108 08/19/2014   CO2 29 08/19/2014   BUN 6 08/19/2014   CREATININE 0.74 08/19/2014   GLUCOSE 105* 08/19/2014   Lab Results  Component Value Date   INR 0.94 10/24/2013    Assessment/Plan: Right L5-S1 herniated disc, lumbago, lumbar radiculopathy: I have discussed the situation with the patient. I have reviewed her imaging studies with her and pointed out the abnormalities. We have discussed the various treatment options including surgery. I have described the surgical treatment option of a right L5-S1 discectomy. I have shown her surgical models. We have discussed the risks, benefits, alternatives, and likelihood of achieving our goals with surgery. I have answered all the patient's questions. She has decided to proceed with surgery.   Lauren Campos 08/27/2014 10:27 AM

## 2014-08-27 NOTE — Anesthesia Postprocedure Evaluation (Signed)
  Anesthesia Post-op Note  Patient: Lauren Campos  Procedure(s) Performed: Procedure(s) (LRB): Right Lumbar five-Sacral one microdiskectomy (Right)  Patient Location: PACU  Anesthesia Type: General  Level of Consciousness: awake and alert   Airway and Oxygen Therapy: Patient Spontanous Breathing  Post-op Pain: mild  Post-op Assessment: Post-op Vital signs reviewed, Patient's Cardiovascular Status Stable, Respiratory Function Stable, Patent Airway and No signs of Nausea or vomiting  Last Vitals:  Filed Vitals:   08/27/14 1330  BP: 149/71  Pulse: 73  Temp:   Resp: 13    Post-op Vital Signs: stable   Complications: No apparent anesthesia complications

## 2014-08-27 NOTE — Transfer of Care (Signed)
Immediate Anesthesia Transfer of Care Note  Patient: Lauren Campos  Procedure(s) Performed: Procedure(s) with comments: Right Lumbar five-Sacral one microdiskectomy (Right) - Right Lumbar five-Sacral one microdiskectomy  Patient Location: PACU  Anesthesia Type:General  Level of Consciousness: awake, patient cooperative and responds to stimulation  Airway & Oxygen Therapy: Patient Spontanous Breathing and Patient connected to face mask oxygen  Post-op Assessment: Report given to RN and Post -op Vital signs reviewed and stable  Post vital signs: Reviewed and stable  Last Vitals:  Filed Vitals:   08/27/14 0810  BP: 132/72  Pulse: 70  Temp: 36.9 C  Resp: 18    Complications: No apparent anesthesia complications

## 2014-08-27 NOTE — Anesthesia Preprocedure Evaluation (Signed)
Anesthesia Evaluation  Patient identified by MRN, date of birth, ID band Patient awake    Reviewed: Allergy & Precautions, NPO status , Patient's Chart, lab work & pertinent test results  Airway Mallampati: III  TM Distance: <3 FB Neck ROM: Full    Dental no notable dental hx.    Pulmonary COPDformer smoker,  Lung cancer breath sounds clear to auscultation  + decreased breath sounds      Cardiovascular negative cardio ROS  Rhythm:Regular Rate:Normal     Neuro/Psych negative neurological ROS  negative psych ROS   GI/Hepatic Neg liver ROS, GERD-  Medicated,  Endo/Other  Morbid obesity  Renal/GU negative Renal ROS  negative genitourinary   Musculoskeletal negative musculoskeletal ROS (+)   Abdominal   Peds negative pediatric ROS (+)  Hematology negative hematology ROS (+)   Anesthesia Other Findings   Reproductive/Obstetrics negative OB ROS                             Anesthesia Physical Anesthesia Plan  ASA: III  Anesthesia Plan: General   Post-op Pain Management:    Induction: Intravenous and Rapid sequence  Airway Management Planned: Oral ETT  Additional Equipment:   Intra-op Plan:   Post-operative Plan: Extubation in OR and Possible Post-op intubation/ventilation  Informed Consent: I have reviewed the patients History and Physical, chart, labs and discussed the procedure including the risks, benefits and alternatives for the proposed anesthesia with the patient or authorized representative who has indicated his/her understanding and acceptance.   Dental advisory given  Plan Discussed with: CRNA and Surgeon  Anesthesia Plan Comments: (Patient has had difficulty being extubated during last two procedures. Was given narcan after bronchoscopy procedure and had to remain intubated in PACU after VATS where she was weak. Patient had an overdose of rocuronium during that  procedure. Plan on doing RSI, low dose relaxant and narcotic)        Anesthesia Quick Evaluation

## 2014-08-28 ENCOUNTER — Encounter (HOSPITAL_COMMUNITY): Payer: Self-pay | Admitting: Neurosurgery

## 2014-08-28 DIAGNOSIS — M5116 Intervertebral disc disorders with radiculopathy, lumbar region: Secondary | ICD-10-CM | POA: Diagnosis not present

## 2014-08-28 MED ORDER — OXYCODONE-ACETAMINOPHEN 10-325 MG PO TABS: 1.0000 | ORAL_TABLET | ORAL | Status: AC | PRN

## 2014-08-28 MED ORDER — DIAZEPAM 5 MG PO TABS: 5.0000 mg | ORAL_TABLET | Freq: Four times a day (QID) | ORAL | Status: AC | PRN

## 2014-08-28 NOTE — Discharge Summary (Signed)
Physician Discharge Summary  Patient ID: Lauren Campos MRN: [id number] DOB/AGE: 09-26-1955 59 y.o.  Admit date: 08/27/2014 Discharge date: 08/28/2014  Admission Diagnoses: Right L5-S1 herniated disc, lumbago, lumbar radiculopathy  Discharge Diagnoses: The same Active Problems:   Lumbar herniated disc   Discharged Condition: good  Hospital Course: I performed a right L5-S1 discectomy on the patient on 08/27/2014. The surgery went well.  The patient's postoperative course was unremarkable. On postoperative day #1 the patient requested discharge to home. She was given oral and written discharge instructions. All her questions were answered.  Consults: None Significant Diagnostic Studies: None Treatments: Right L5-S1 discectomy using microdissection. Discharge Exam: Blood pressure 105/59, pulse 89, temperature 98.7 F (37.1 C), temperature source Oral, resp. rate 18, weight 94.547 kg (208 lb 7 oz), SpO2 100 %. The patient is alert and pleasant. She looks well. Her leg pain is gone. Her strength is normal.  Disposition: Home  Discharge Instructions    Call MD for:  difficulty breathing, headache or visual disturbances    Complete by:  As directed      Call MD for:  extreme fatigue    Complete by:  As directed      Call MD for:  hives    Complete by:  As directed      Call MD for:  persistant dizziness or light-headedness    Complete by:  As directed      Call MD for:  persistant nausea and vomiting    Complete by:  As directed      Call MD for:  redness, tenderness, or signs of infection (pain, swelling, redness, odor or green/yellow discharge around incision site)    Complete by:  As directed      Call MD for:  severe uncontrolled pain    Complete by:  As directed      Call MD for:  temperature >100.4    Complete by:  As directed      Diet - low sodium heart healthy    Complete by:  As directed      Discharge instructions    Complete by:  As directed   Call  [phone] for a followup appointment. Take a stool softener while you are using pain medications.     Driving Restrictions    Complete by:  As directed   Do not drive for 2 weeks.     Increase activity slowly    Complete by:  As directed      Lifting restrictions    Complete by:  As directed   Do not lift more than 5 pounds. No excessive bending or twisting.     May shower / Bathe    Complete by:  As directed   He may shower after the pain she is removed 3 days after surgery. Leave the incision alone.     Remove dressing in 48 hours    Complete by:  As directed   Your stitches are under the scan and will dissolve by themselves. The Steri-Strips will fall off after you take a few showers. Do not rub back or pick at the wound, Leave the wound alone.            Medication List    STOP taking these medications        HYDROcodone-acetaminophen 5-325 MG per tablet  Commonly known as:  NORCO/VICODIN     LORazepam 1 MG tablet  Commonly known as:  ATIVAN     oxyCODONE-acetaminophen 5-325  MG per tablet  Commonly known as:  PERCOCET/ROXICET  Replaced by:  oxyCODONE-acetaminophen 10-325 MG per tablet      TAKE these medications        amitriptyline 100 MG tablet  Commonly known as:  ELAVIL  TAKE 1 TABLET (100 MG TOTAL) BY MOUTH AT BEDTIME.     betamethasone valerate lotion 0.1 %  Commonly known as:  VALISONE  Apply 1 application topically as needed for irritation.     diazepam 5 MG tablet  Commonly known as:  VALIUM  Take 1 tablet (5 mg total) by mouth every 6 (six) hours as needed for muscle spasms.     omeprazole 20 MG capsule  Commonly known as:  PRILOSEC  Take 1 capsule (20 mg total) by mouth daily.     ondansetron 4 MG disintegrating tablet  Commonly known as:  ZOFRAN ODT  4mg  ODT q4 hours prn nausea/vomit     oxyCODONE-acetaminophen 10-325 MG per tablet  Commonly known as:  PERCOCET  Take 1 tablet by mouth every 4 (four) hours as needed for pain.      pantoprazole 20 MG tablet  Commonly known as:  PROTONIX  Take 1 tablet (20 mg total) by mouth daily.     potassium chloride SA 20 MEQ tablet  Commonly known as:  K-DUR,KLOR-CON  Take 1 tablet (20 mEq total) by mouth daily.     sodium chloride 0.65 % Soln nasal spray  Commonly known as:  OCEAN  Place 2 sprays into both nostrils as needed for congestion.     traMADol 50 MG tablet  Commonly known as:  ULTRAM  Take 1 tablet (50 mg total) by mouth every 6 (six) hours as needed.     traZODone 50 MG tablet  Commonly known as:  DESYREL  Take 1 tablet (50 mg total) by mouth at bedtime.         SignedNewman Pies D 08/28/2014, 8:16 AM

## 2014-08-28 NOTE — Progress Notes (Signed)
Patient alert and oriented, mae's well, voiding adequate amount of urine, swallowing without difficulty, no c/o pain. Patient discharged home with family. Script and discharged instructions given to patient. Patient and family stated understanding of d/c instructions given and has an appointment with MD. Aisha Jeramey Lanuza RN 

## 2014-09-04 ENCOUNTER — Telehealth: Payer: Self-pay | Admitting: *Deleted

## 2014-09-04 NOTE — Telephone Encounter (Signed)
Pt's daughter Jonelle Sidle presented to the office said she was unable to contact pt,  so she had EMS go to the house, pt had back surgery and was found by EMS on August 29, 2022 deceased in her home.  Tiffany needed to have an insurance paper filled out saying pt was being seen here by Dr.K. Filled out Dr. Raliegh Ip information, our office address and phone number and how many years a pt here. Paper was given back to Tiffany by Danae Chen and told her she will need to fill out the rest about other providers pt was seeing and insurance information. Tiffany verbalized understanding.

## 2014-09-21 DEATH — deceased

## 2015-05-10 IMAGING — DX DG CHEST 1V PORT
2 series · 2 of 2 positions shown · non-contrast
Comparison: 10/28/2013 at 7789 hr and earlier.

ADDENDUM:
Study discussed by telephone with RN Moyo Barone on 10/28/2013 at
[DATE] hrs.

Also, gastric band is incidentally re- identified on this image.
CLINICAL DATA: 58-year-old female status post VATS and central
line. Initial encounter.
EXAM:
PORTABLE CHEST - 1 VIEW

[chest ap (1 of 2)]
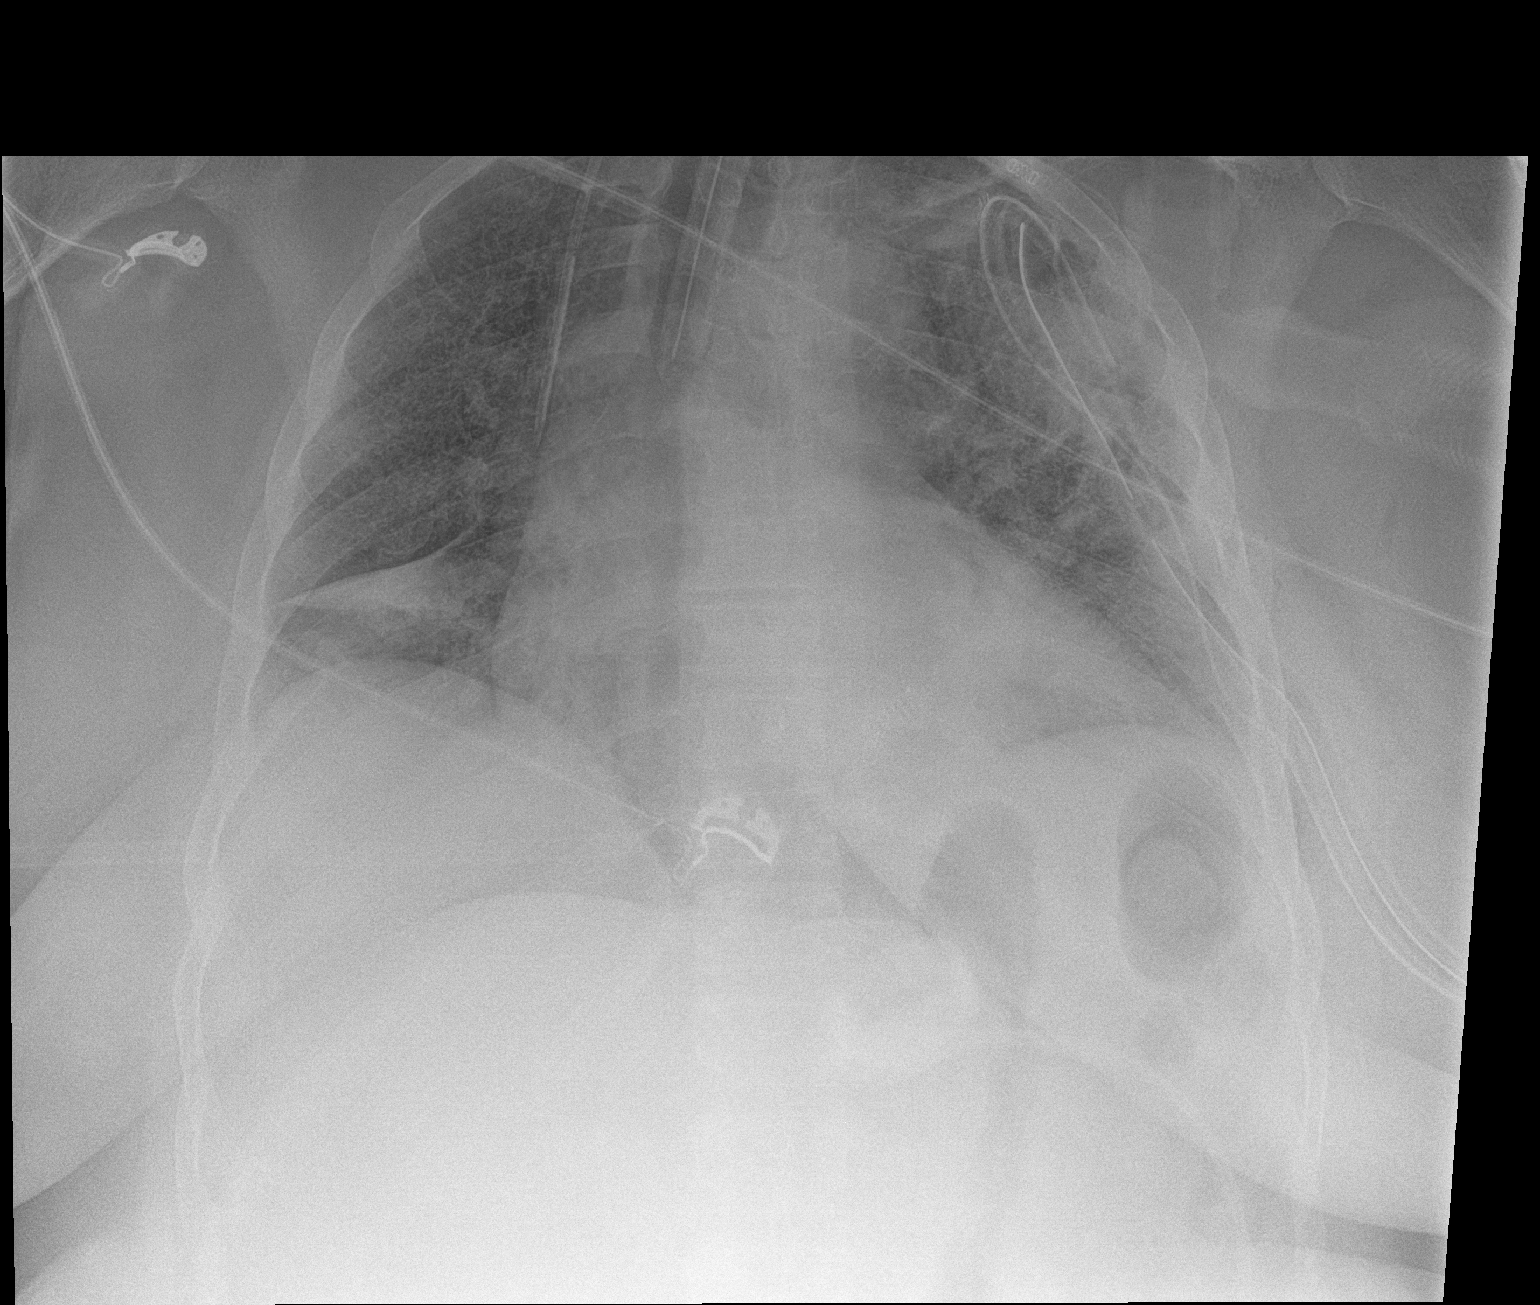

[chest ap (2 of 2)]
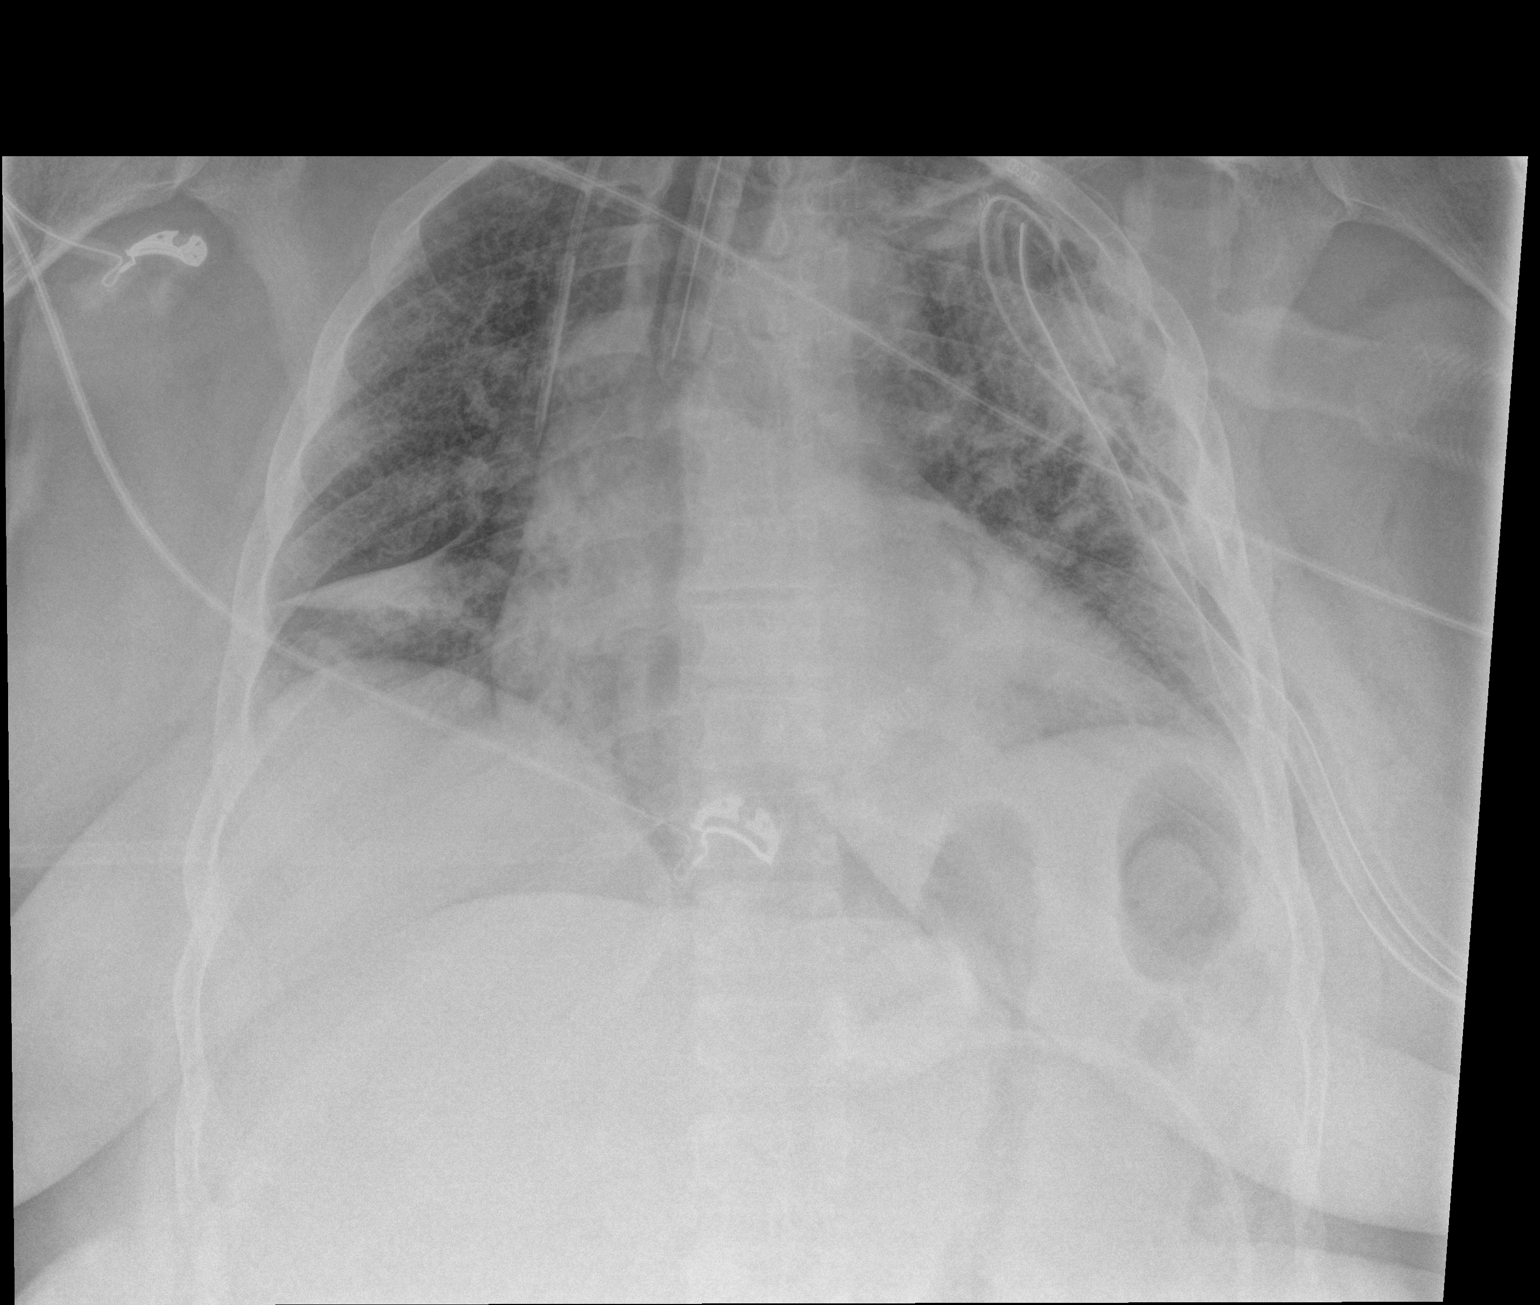

[2 of 2 positions shown; findings below may reference images not displayed]

FINDINGS: Seated upright AP portable view at 5469 hrs. Endotracheal tube tip
at the carina. Right IJ central line tip at the lower SVC level,
just below the carina. Left chest tubes in place.

No pneumothorax. There is confluent opacity along the right major or
minor fissure which most resembles atelectasis. There is vague
postoperative opacity in the left upper lobe. Normal cardiac size
and mediastinal contours.
IMPRESSION: 1. Endotracheal tube tip at the carina, retract 2 cm for more
optimal placement.
2. Right IJ central line tip at the lower SVC level. Left chest
tubes in place. No pneumothorax.
3. Postoperative opacity in the left lung. Mild atelectasis on the
right.

## 2015-05-10 IMAGING — CR DG CHEST 2V
2 series · 2 of 2 positions shown · non-contrast
Comparison: 08/27/2013 and chest CT on 07/29/2013

CLINICAL DATA: Left lung mass.  Pre-op respiratory exam for VATS.

EXAM:
CHEST  2 VIEW

[w chest pa]
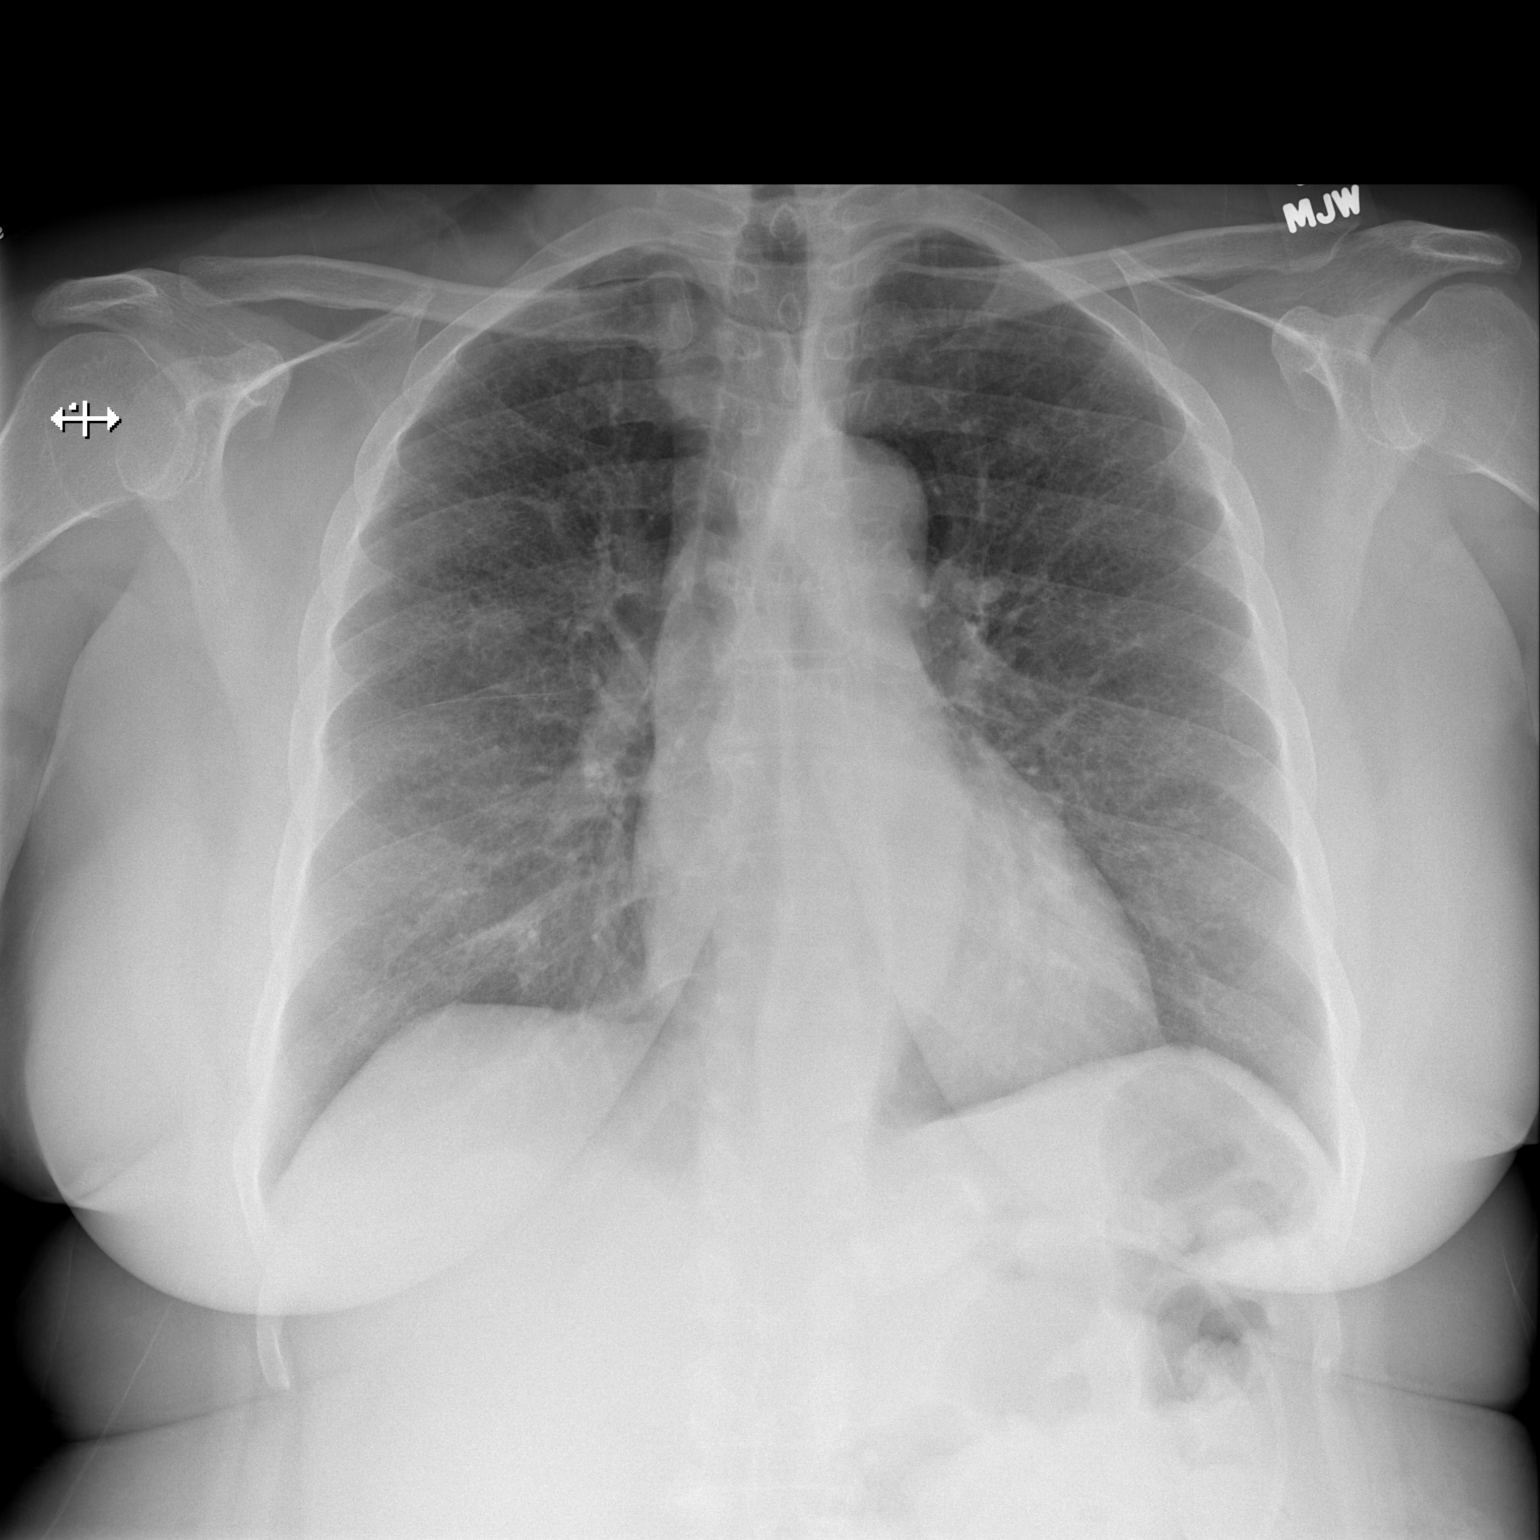

[w chest lat]
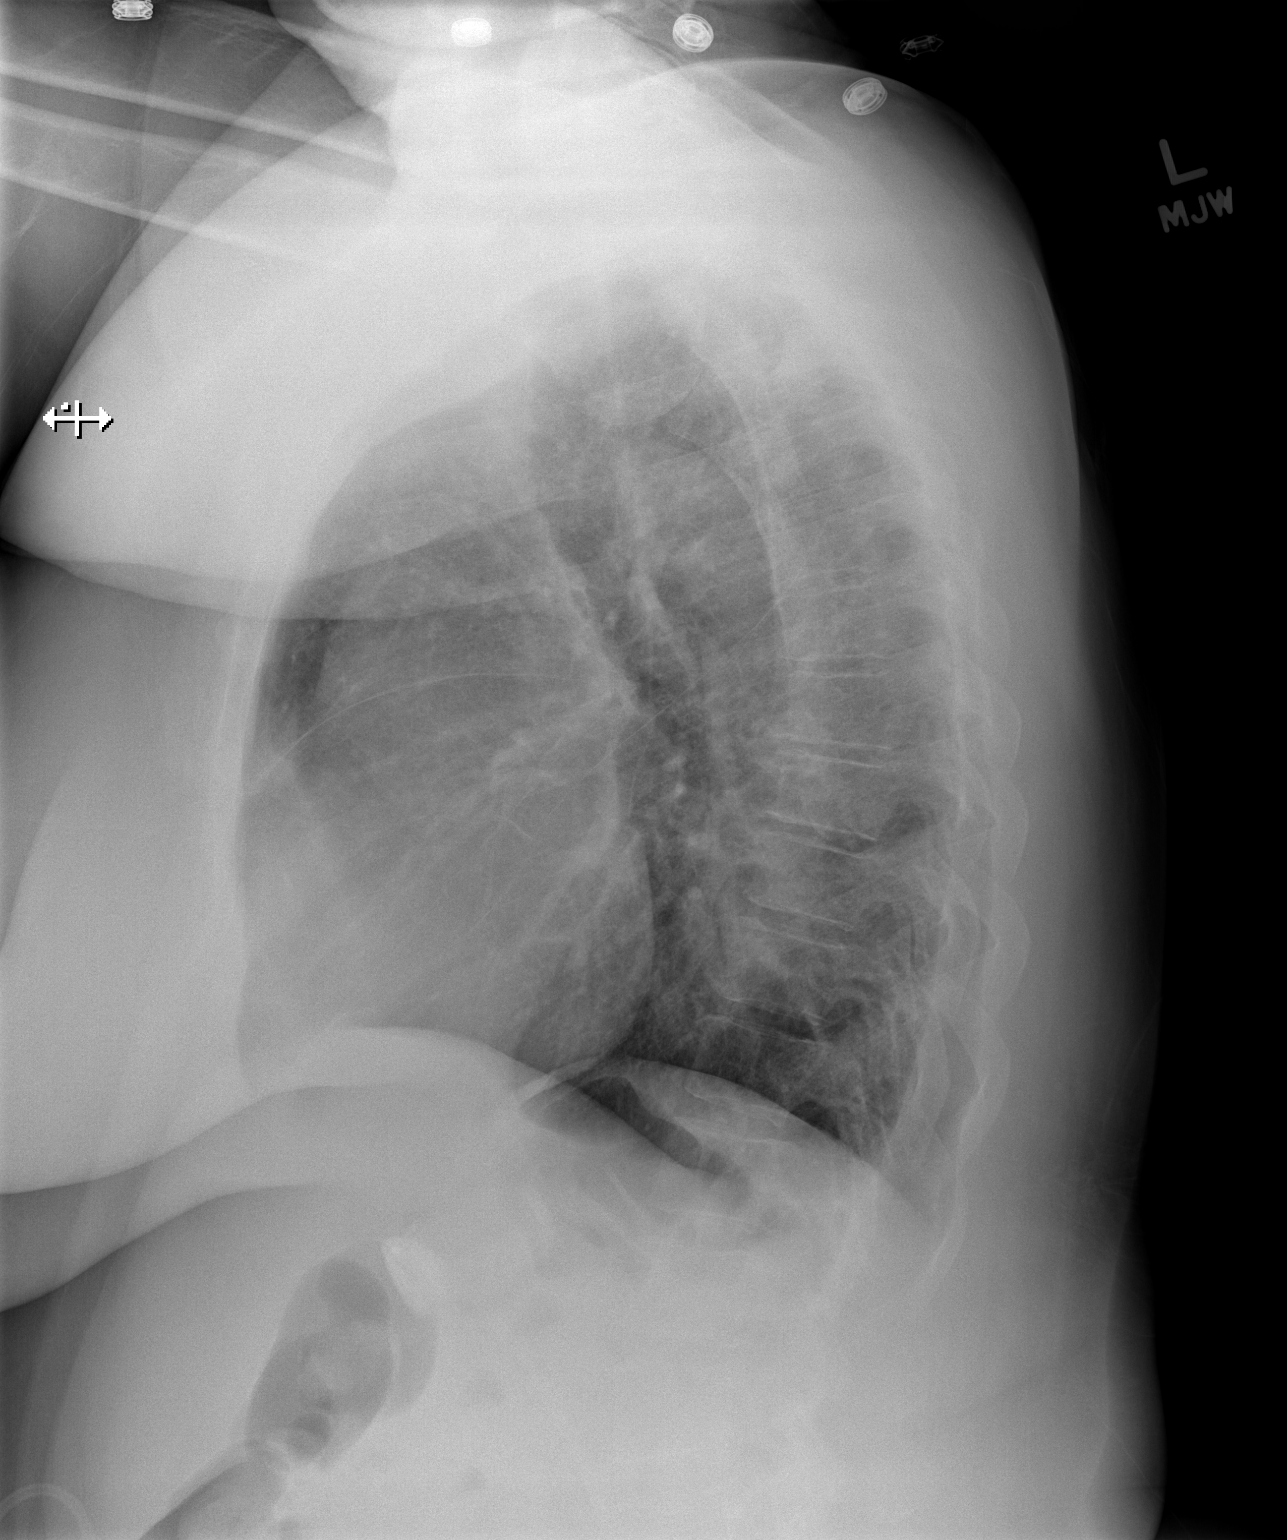

[2 of 2 positions shown; findings below may reference images not displayed]

FINDINGS: Small nodular density again seen in the left upper lobe. Lung fields
are otherwise clear. No evidence of pleural effusion. Heart size and
mediastinal contours are normal.
IMPRESSION: Small left upper lobe pulmonary nodule, as better demonstrated on
recent CT. Otherwise negative.

## 2015-05-14 IMAGING — CR DG CHEST 2V
2 series · 2 of 2 positions shown · non-contrast
Comparison: Portable chest x-ray of 10/31/2013

CLINICAL DATA: Infiltrates, some shortness of breath, follow-up

EXAM:
CHEST  2 VIEW

[w chest pa]
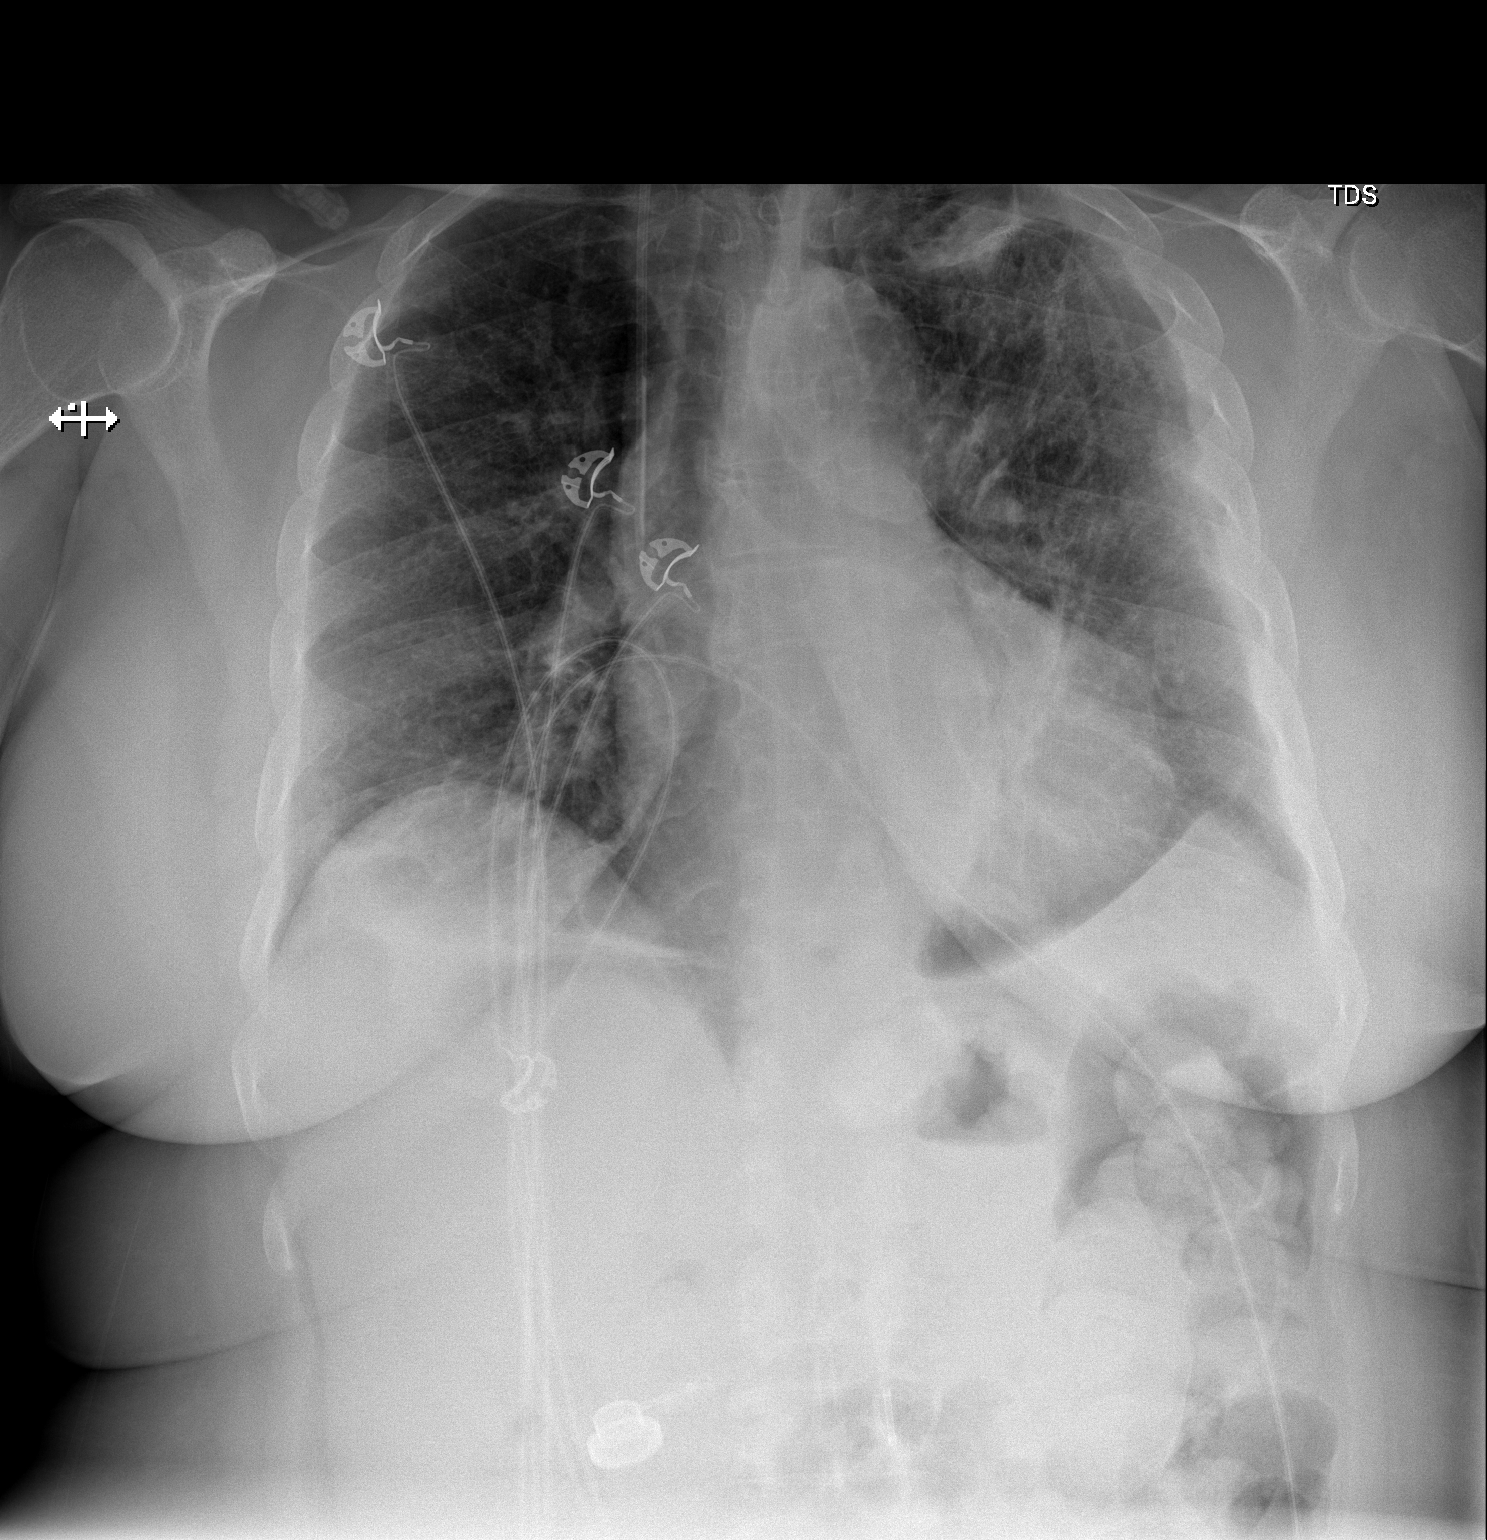

[w chest lat]
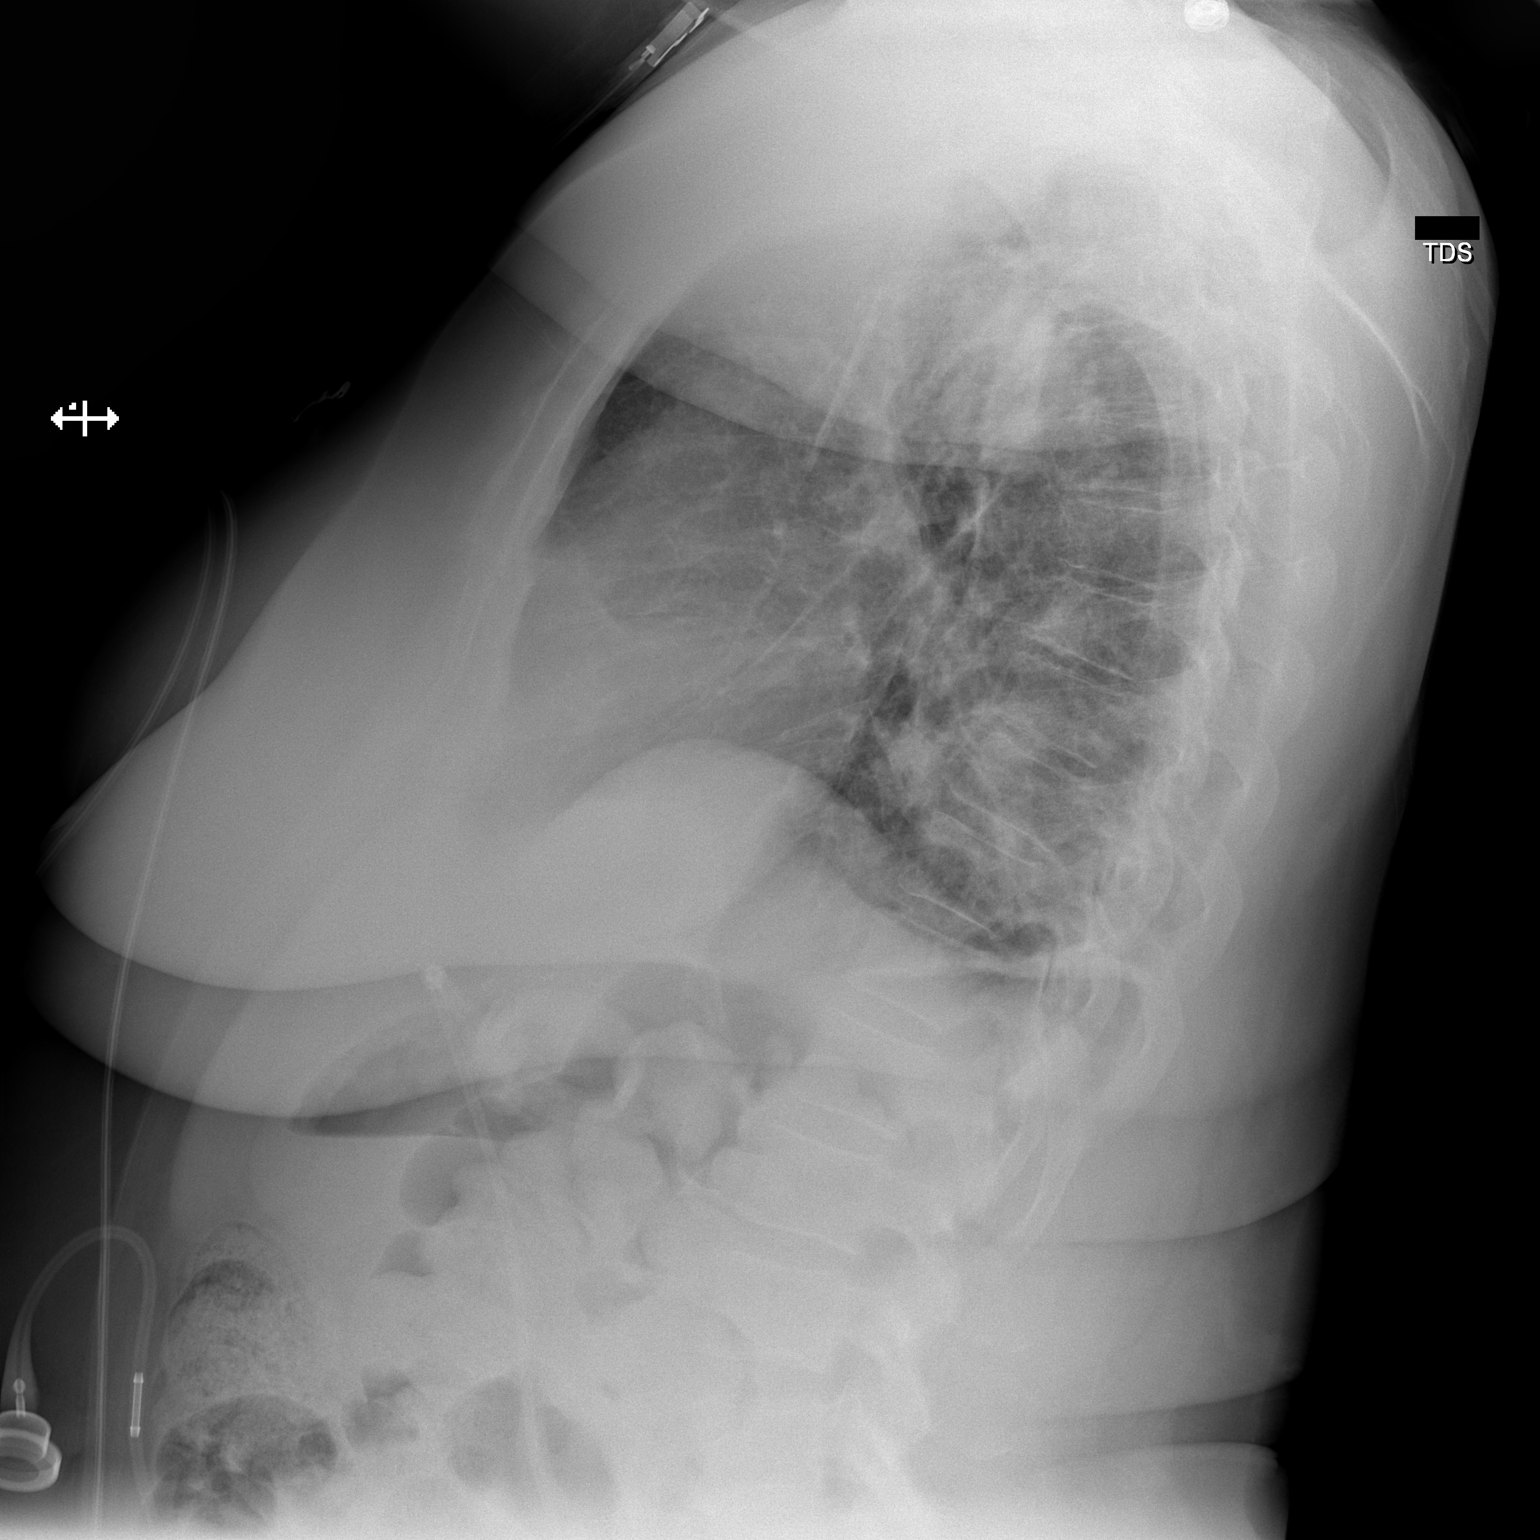

[2 of 2 positions shown; findings below may reference images not displayed]

FINDINGS: There has been very slight increase in volume of the small left
apical pneumothorax. Probable linear atelectasis at the lung bases
has increased. Cardiomegaly is stable. A right IJ central venous
line is unchanged in position.
IMPRESSION: Slight increase in volume of left apical pneumothorax. Increase in
bibasilar atelectasis.
